# Patient Record
Sex: Female | Born: 1980 | Race: White | Hispanic: No | Marital: Married | Smoking: Former smoker
Health system: Southern US, Community
[De-identification: ages and names within clinical notes are randomized; demographics above are authoritative.]

## PROBLEM LIST (undated history)

## (undated) DIAGNOSIS — F419 Anxiety disorder, unspecified: Secondary | ICD-10-CM

## (undated) DIAGNOSIS — N83209 Unspecified ovarian cyst, unspecified side: Secondary | ICD-10-CM

## (undated) DIAGNOSIS — I10 Essential (primary) hypertension: Secondary | ICD-10-CM

## (undated) HISTORY — PX: WISDOM TOOTH EXTRACTION: SHX21

## (undated) HISTORY — PX: CARPAL TUNNEL RELEASE: SHX101

## (undated) HISTORY — DX: Anxiety disorder, unspecified: F41.9

---

## 2016-11-06 DIAGNOSIS — I10 Essential (primary) hypertension: Secondary | ICD-10-CM | POA: Insufficient documentation

## 2016-11-06 DIAGNOSIS — N329 Bladder disorder, unspecified: Secondary | ICD-10-CM | POA: Insufficient documentation

## 2016-11-06 DIAGNOSIS — K219 Gastro-esophageal reflux disease without esophagitis: Secondary | ICD-10-CM | POA: Insufficient documentation

## 2017-09-29 ENCOUNTER — Encounter (HOSPITAL_BASED_OUTPATIENT_CLINIC_OR_DEPARTMENT_OTHER): Payer: Self-pay

## 2017-09-29 ENCOUNTER — Other Ambulatory Visit: Payer: Self-pay

## 2017-09-29 ENCOUNTER — Emergency Department (HOSPITAL_BASED_OUTPATIENT_CLINIC_OR_DEPARTMENT_OTHER)
Admission: EM | Admit: 2017-09-29 | Discharge: 2017-09-29 | Disposition: A | Discharge status: Home / Self Care | Payer: Self-pay | Attending: Emergency Medicine | Admitting: Emergency Medicine

## 2017-09-29 DIAGNOSIS — M25551 Pain in right hip: Secondary | ICD-10-CM | POA: Insufficient documentation

## 2017-09-29 DIAGNOSIS — M545 Low back pain, unspecified: Secondary | ICD-10-CM

## 2017-09-29 DIAGNOSIS — M79604 Pain in right leg: Secondary | ICD-10-CM | POA: Insufficient documentation

## 2017-09-29 DIAGNOSIS — F17210 Nicotine dependence, cigarettes, uncomplicated: Secondary | ICD-10-CM | POA: Insufficient documentation

## 2017-09-29 DIAGNOSIS — I10 Essential (primary) hypertension: Secondary | ICD-10-CM | POA: Insufficient documentation

## 2017-09-29 HISTORY — DX: Essential (primary) hypertension: I10

## 2017-09-29 MED ORDER — CYCLOBENZAPRINE HCL 10 MG PO TABS
10.0000 mg | ORAL_TABLET | Freq: Once | ORAL | Status: AC
  Administered 2017-09-29: 10 mg via ORAL
  Filled 2017-09-29: qty 1

## 2017-09-29 MED ORDER — KETOROLAC TROMETHAMINE 30 MG/ML IJ SOLN
30.0000 mg | Freq: Once | INTRAMUSCULAR | Status: AC
  Administered 2017-09-29: 30 mg via INTRAMUSCULAR
  Filled 2017-09-29: qty 1

## 2017-09-29 MED ORDER — CYCLOBENZAPRINE HCL 10 MG PO TABS: 10.0000 mg | ORAL_TABLET | Freq: Two times a day (BID) | ORAL | 0 refills | Status: DC | PRN

## 2017-09-29 NOTE — ED Notes (Addendum)
Reports right hip and leg pain.  States the pain radiates down her leg.  Reports recent extended periods of travel in car due to moving from [place] to [place].  Denies chest pain, shortness of breath.  Reports also lifting heavy boxes.

## 2017-09-29 NOTE — Discharge Instructions (Addendum)
The pain you feel coming down your leg is starting in your low back. I also felt a muscle spasm in your back and gluteal region. I have prescribed you a muscle relaxer that will help with the spasms.  You may use Tylenol and/or Ibuprofen for pain relief. You may also find that a warm compress or heating pad in your low back may give additional relief.   I have placed information on an orthopedic provider that you can follow-up with if you continue to have problems after 4-6 weeks.

## 2017-09-29 NOTE — ED Provider Notes (Signed)
[facility] EMERGENCY DEPARTMENT Provider Note  CSN: [id number] Arrival date & time: 09/29/17  1100   History   Chief Complaint Chief Complaint  Patient presents with  . Leg Pain    HPI Jennifer Salazar is a 37 y.o. female with no significant medical history who presented to the ED for right hip and leg pain x2 days. She describes sharp, shooting and burning pain that begins in her right hip and goes down her leg. She also describes an achy feeling in her hip when touched as well. Denies fever, leg swelling, palpitations, cough and SOB. Denies recent falls, traumas or injuries. Denies extremity paresthesias, weakness, foot drop or bowel/bladder dysfunction. 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   Past Medical History:  Diagnosis Date  . Hypertension     There are no active problems to display for this patient.   History reviewed. No pertinent surgical history.   OB History   None      Home Medications    Prior to Admission medications   Medication Sig Start Date End Date Taking? Authorizing Provider  cyclobenzaprine (FLEXERIL) 10 MG tablet Take 1 tablet (10 mg total) by mouth 2 (two) times daily as needed for muscle spasms. 09/29/17   Mortis, Sharyon MedicusGabrielle I, PA-C    Family History No family history on file.  Social History Social History   Tobacco Use  . Smoking status: Current Every Day Smoker  . Smokeless tobacco: Never Used  Substance Use Topics  . Alcohol use: Never    Frequency: Never  . Drug use: Never     Allergies   Reglan [metoclopramide]   Review of Systems Review of Systems  Constitutional: Negative.   Respiratory: Negative for cough and shortness of breath.   Cardiovascular: Negative for chest pain, palpitations and leg swelling.  Genitourinary: Negative.   Musculoskeletal: Positive for back pain. Negative for arthralgias, gait problem, myalgias and neck pain.  Skin: Negative.   Neurological: Negative for weakness and numbness.         Burning pain in right lower extremity   Physical Exam Updated Vital Signs BP (!) 143/94 (BP Location: Left Arm)   Pulse 94   Temp 98.2 F (36.8 C) (Oral)   Resp 18   Ht 5\' 2"  (1.575 m)   Wt 80.9 kg   SpO2 99%   BMI 32.62 kg/m   Physical Exam  Constitutional: She appears well-developed and well-nourished. No distress.  Cardiovascular: Intact distal pulses and normal pulses.  Musculoskeletal:       Lumbar back: She exhibits decreased range of motion, tenderness and spasm. She exhibits no bony tenderness.  Full active and passive ROM of upper and lower extremities bilaterally with 5/5 strength. Limited movement in lumbar spine due to pain. No bony tenderness of spinous processes. Muscle spasm palpated in right lumbar region. Negative straight leg raise. No bony or muscular tenderness of the right lower extremity with movement or palpation.  Neurological: She has normal strength. She displays no atrophy. No sensory deficit. She exhibits normal muscle tone. Gait normal.  Reflex Scores:      Tricep reflexes are 2+ on the right side and 2+ on the left side.      Bicep reflexes are 2+ on the right side and 2+ on the left side.      Brachioradialis reflexes are 2+ on the right side and 2+ on the left side.      Patellar reflexes are 2+ on the right side and  2+ on the left side.      Achilles reflexes are 2+ on the right side and 2+ on the left side. Skin: Skin is warm and intact. Capillary refill takes less than 2 seconds. No bruising and no ecchymosis noted.  Nursing note and vitals reviewed.  ED Treatments / Results  Labs (all labs ordered are listed, but only abnormal results are displayed) Labs Reviewed - No data to display  EKG None  Radiology No results found.  Procedures Procedures (including critical care time)  Medications Ordered in ED Medications  cyclobenzaprine (FLEXERIL) tablet 10 mg (10 mg Oral Given 09/29/17 1322)  ketorolac (TORADOL) 30 MG/ML injection 30  mg (30 mg Intramuscular Given 09/29/17 1321)   Initial Impression / Assessment and Plan / ED Course  Triage vital signs and the nursing notes have been reviewed.  Pertinent labs & imaging results that were available during care of the patient were reviewed and considered in medical decision making (see chart for details).   Patient is in no distress and well appearing. Patient complains of right hip pain, but on further evaluation pain is originating from the right lumbar region where patient has the most tenderness on palpation. Fortunately, there are no abnormal neuro findings that raise concern for acute spinal cord pathology. PERC score of 0.  No bony tenderness or deformities of joints that warrant imaging. Neurovascular function is intact. There are no other physical exam findings or s/s that suggest an underlying infectious or rheumatologic process that warrant further evaluation or intervention today.  Final Clinical Impressions(s) / ED Diagnoses  1. Low Back Pain. Education provided on OTC and supportive treatment for pain relief and inflammation. Flexeril prescribed for muscle spasms.  Dispo: Home. After thorough clinical evaluation, this patient is determined to be medically stable and can be safely discharged with the previously mentioned treatment and/or outpatient follow-up/referral(s). At this time, there are no other apparent medical conditions that require further screening, evaluation or treatment.  Final diagnoses:  Acute right-sided low back pain without sciatica    ED Discharge Orders         Ordered    cyclobenzaprine (FLEXERIL) 10 MG tablet  2 times daily PRN     09/29/17 1347            Dagoberto LigasMortis, Gabrielle I, PA-C 09/29/17 1427    Sabas SousBero, Michael M, MD 09/29/17 1451

## 2017-09-29 NOTE — ED Triage Notes (Signed)
C/o right LE pain x 2 weeks-denies injury-NAD-steady gait

## 2017-09-30 ENCOUNTER — Ambulatory Visit: Payer: Self-pay | Admitting: Family Medicine

## 2017-10-23 ENCOUNTER — Other Ambulatory Visit: Payer: Self-pay

## 2017-10-23 ENCOUNTER — Emergency Department (HOSPITAL_COMMUNITY)
Admission: EM | Admit: 2017-10-23 | Discharge: 2017-10-24 | Disposition: A | Discharge status: Home / Self Care | Payer: No Typology Code available for payment source | Attending: Emergency Medicine | Admitting: Emergency Medicine

## 2017-10-23 ENCOUNTER — Encounter (HOSPITAL_COMMUNITY): Payer: Self-pay

## 2017-10-23 DIAGNOSIS — G43B0 Ophthalmoplegic migraine, not intractable: Secondary | ICD-10-CM | POA: Diagnosis not present

## 2017-10-23 DIAGNOSIS — I10 Essential (primary) hypertension: Secondary | ICD-10-CM | POA: Insufficient documentation

## 2017-10-23 DIAGNOSIS — Z79899 Other long term (current) drug therapy: Secondary | ICD-10-CM | POA: Insufficient documentation

## 2017-10-23 DIAGNOSIS — R451 Restlessness and agitation: Secondary | ICD-10-CM | POA: Insufficient documentation

## 2017-10-23 DIAGNOSIS — R51 Headache: Secondary | ICD-10-CM | POA: Diagnosis present

## 2017-10-23 MED ORDER — SODIUM CHLORIDE 0.9 % IV BOLUS
1000.0000 mL | Freq: Once | INTRAVENOUS | Status: AC
  Administered 2017-10-23: 1000 mL via INTRAVENOUS

## 2017-10-23 MED ORDER — INDOMETHACIN 25 MG PO CAPS: 25.0000 mg | ORAL_CAPSULE | Freq: Three times a day (TID) | ORAL | 0 refills | Status: DC | PRN

## 2017-10-23 MED ORDER — DEXAMETHASONE SODIUM PHOSPHATE 10 MG/ML IJ SOLN
10.0000 mg | Freq: Once | INTRAMUSCULAR | Status: AC
  Administered 2017-10-23: 10 mg via INTRAVENOUS
  Filled 2017-10-23: qty 1

## 2017-10-23 MED ORDER — PROMETHAZINE HCL 25 MG/ML IJ SOLN
12.5000 mg | Freq: Once | INTRAMUSCULAR | Status: AC
  Administered 2017-10-23: 12.5 mg via INTRAVENOUS
  Filled 2017-10-23: qty 1

## 2017-10-23 MED ORDER — DIPHENHYDRAMINE HCL 50 MG/ML IJ SOLN
25.0000 mg | Freq: Once | INTRAMUSCULAR | Status: AC
  Administered 2017-10-23: 25 mg via INTRAVENOUS
  Filled 2017-10-23: qty 1

## 2017-10-23 MED ORDER — LORAZEPAM 2 MG/ML IJ SOLN
1.0000 mg | Freq: Once | INTRAMUSCULAR | Status: AC
  Administered 2017-10-23: 1 mg via INTRAVENOUS
  Filled 2017-10-23: qty 1

## 2017-10-23 MED ORDER — KETOROLAC TROMETHAMINE 15 MG/ML IJ SOLN
15.0000 mg | Freq: Once | INTRAMUSCULAR | Status: AC
  Administered 2017-10-23: 15 mg via INTRAVENOUS
  Filled 2017-10-23: qty 1

## 2017-10-23 NOTE — Discharge Instructions (Addendum)
Take indomethacin with melatonin if symptoms continue. Return to the emergency room for any worsening or concerning symptoms. Follow-up with primary care, referrals given for local primary care providers.  Today in the emergency room you were given Decadron, Phenergan, Benadryl and Toradol.  Been given Ativan for restless legs.

## 2017-10-23 NOTE — ED Provider Notes (Addendum)
[facility]-EMERGENCY DEPT Provider Note   CSN: [id number] Arrival date & time: 10/23/17  1332     History   Chief Complaint Chief Complaint  Patient presents with  . Migraine  . Anxiety    HPI Jennifer Salazar is a 37 y.o. female.  37 year old female presents with complaint of headache.  Patient states that she has a history of migraines since she was a child.  Patient states that she started getting a migraine a few days ago, last night noticed that she was developing a sudden sharp stabbing pain in her left eye.  Patient states that she had a similar migraine with the ice pick pain in the left eye about 2 years ago while living in another state.  Patient states that she had had that migraine for about a month, went to the ER after 3 weeks of pain and was given medication which made her sleepy but did not help with her headache, states that she woke up with 10 fold increase in her pain.  Patient was seen by her gynecologist with plan to remove her IUD as she thought this may be causing her headache and the gynecologist started her on a birth control pack with the plan of the gradually decreasing dose of hormone would better control her headache (prior to the IUD was on Depo, the GYN thought the high dose of the depo vs the low hormone level of the IUD had caused the headache).  Patient states that she had not had another headache this severe until now.  Reports nausea and vomiting with light sensitivity. Patient also reports starry vision aura prior to onset of her migraine which is typical for her migraines. Patient began to feel very anxious when she developed the pain in her eye with fear the headache would last a month again like the last episode. Denies changes in speech/gait, weakness, denies sudden severe onset of her pain. No relief with her Excedrin and Fioricet at home, reports some relief with application of a heating pad to her forehead. No other complaints or  concerns. Last CT head was 2 years ago when in the ER for similar headache, normal, has never been seen by neurology.      Past Medical History:  Diagnosis Date  . Hypertension     There are no active problems to display for this patient.   Past Surgical History:  Procedure Laterality Date  . WISDOM TOOTH EXTRACTION       OB History   None      Home Medications    Prior to Admission medications   Medication Sig Start Date End Date Taking? Authorizing Provider  butalbital-acetaminophen-caffeine (FIORICET, ESGIC) 50-325-40 MG tablet Take 1 tablet by mouth as needed for migraine. 08/24/17  Yes [provider]  calcium carbonate (TUMS - DOSED IN MG ELEMENTAL CALCIUM) 500 MG chewable tablet Chew 1 tablet by mouth as needed for indigestion or heartburn.   Yes [provider]  cyclobenzaprine (FLEXERIL) 10 MG tablet Take 1 tablet (10 mg total) by mouth 2 (two) times daily as needed for muscle spasms. 09/29/17  Yes Mortis, Sharyon Medicus, PA-C  ibuprofen (ADVIL,MOTRIN) 200 MG tablet Take 400 mg by mouth every 6 (six) hours as needed for moderate pain.   Yes [provider]  lisinopril (PRINIVIL,ZESTRIL) 20 MG tablet Take 20 mg by mouth 2 (two) times daily.   Yes [provider]  omeprazole (PRILOSEC) 40 MG capsule Take 40 mg by mouth as  needed for heartburn. 10/12/17  Yes [provider]  promethazine (PHENERGAN) 25 MG tablet Take 25 mg by mouth as needed for nausea.  07/30/17  Yes [provider]  traMADol (ULTRAM) 50 MG tablet Take 50 mg by mouth as needed for pain. 10/13/17  Yes [provider]  indomethacin (INDOCIN) 25 MG capsule Take 1 capsule (25 mg total) by mouth 3 (three) times daily as needed. 10/23/17   Jeannie Fend, PA-C    Family History Family History  Problem Relation Age of Onset  . Hypertension Mother   . COPD Father   . Crohn's disease Father     Social History Social History   Tobacco Use  . Smoking  status: Current Every Day Smoker    Packs/day: 0.50    Types: Cigarettes  . Smokeless tobacco: Never Used  Substance Use Topics  . Alcohol use: Never    Frequency: Never  . Drug use: Never     Allergies   Reglan [metoclopramide]   Review of Systems Review of Systems  Constitutional: Negative for chills and fever.  HENT: Negative for congestion and dental problem.   Eyes: Positive for pain. Negative for redness and visual disturbance.  Gastrointestinal: Positive for nausea and vomiting. Negative for abdominal pain, constipation and diarrhea.  Genitourinary: Negative for dysuria, frequency and urgency.  Musculoskeletal: Negative for neck pain and neck stiffness.  Skin: Negative for rash and wound.  Allergic/Immunologic: Negative for immunocompromised state.  Neurological: Positive for headaches. Negative for dizziness, facial asymmetry, speech difficulty, weakness and numbness.  Hematological: Does not bruise/bleed easily.  Psychiatric/Behavioral: Negative for confusion.  All other systems reviewed and are negative.    Physical Exam Updated Vital Signs BP (!) 159/95 (BP Location: Left Arm)   Pulse 95   Temp 98.7 F (37.1 C) (Oral)   Resp 14   Ht 5\' 2"  (1.575 m)   Wt 81.6 kg   SpO2 95%   BMI 32.92 kg/m   Physical Exam  Constitutional: She is oriented to person, place, and time. She appears well-developed and well-nourished. No distress.  Appears uncomfortable, lights dimmed for comfort  HENT:  Head: Normocephalic and atraumatic.  Mouth/Throat: Oropharynx is clear and moist. No oropharyngeal exudate.  Eyes: Pupils are equal, round, and reactive to light. Conjunctivae and EOM are normal.  Neck: Neck supple.  Cardiovascular: Intact distal pulses.  Pulmonary/Chest: Effort normal.  Musculoskeletal: She exhibits no tenderness.  Neurological: She is alert and oriented to person, place, and time. She has normal strength. She is not disoriented. She displays normal  reflexes. No cranial nerve deficit or sensory deficit. She exhibits normal muscle tone. GCS eye subscore is 4. GCS verbal subscore is 5. GCS motor subscore is 6. She displays no Babinski's sign on the right side. She displays no Babinski's sign on the left side.  Skin: Skin is warm and dry. No rash noted. She is not diaphoretic.  Psychiatric: She has a normal mood and affect. Her behavior is normal.  Nursing note and vitals reviewed.    ED Treatments / Results  Labs (all labs ordered are listed, but only abnormal results are displayed) Labs Reviewed - No data to display  EKG None  Radiology No results found.  Procedures Procedures (including critical care time)  Medications Ordered in ED Medications  diphenhydrAMINE (BENADRYL) capsule 25 mg (25 mg Oral Refused 10/24/17 0024)  magnesium sulfate IVPB 2 g 50 mL (has no administration in time range)  dexamethasone (DECADRON) injection 10 mg (10  mg Intravenous Given 10/23/17 2227)  ketorolac (TORADOL) 15 MG/ML injection 15 mg (15 mg Intravenous Given 10/23/17 2224)  diphenhydrAMINE (BENADRYL) injection 25 mg (25 mg Intravenous Given 10/23/17 2225)  promethazine (PHENERGAN) injection 12.5 mg (12.5 mg Intravenous Given 10/23/17 2222)  sodium chloride 0.9 % bolus 1,000 mL (1,000 mLs Intravenous New Bag/Given 10/23/17 2229)  LORazepam (ATIVAN) injection 1 mg (1 mg Intravenous Given 10/23/17 2315)  acetaminophen (TYLENOL) tablet 650 mg (650 mg Oral Given 10/24/17 0013)     Initial Impression / Assessment and Plan / ED Course  I have reviewed the triage vital signs and the nursing notes.  Pertinent labs & imaging results that were available during my care of the patient were reviewed by me and considered in my medical decision making (see chart for details).  Clinical Course as of Oct 24 29  Sat Oct 23, 2017  [id number] 37 year old female presents with complaint of migraine headache now with ice pick feeling in her left eye.  Patient states she has a  history of migraines since she was a teenager, had a similar ice pick headache 2 [place] ago [place] out of state.  Patient was seen in the emergency room 2 years ago with this headache, CT of the head was normal, states she had intraocular pressure checked and this was normal as well.  Headache was finally relieved when she saw her gynecologist to put her on a birth control pack.  Exam is unremarkable, patient sensitive to light, visibly uncomfortable in the room.  Patient was given IV fluids with Phenergan, Toradol, Decadron, Benadryl.  Patient states she has had some relief of her headache pain however 1 of the medications made her legs feel restless.  Patient was given Ativan for this with little relief.  Referral given for area PCP for follow-up, return to ER for any worsening or concerning symptoms.  Up-to-date recommendation for ice pick headache includes indomethacin with melatonin, discussed this with patient, prescription given for indomethacin.   [LM]  Sun Oct 24, 2017  0029 Patient reports ongoing restless legs, requests something to help with this, no relief with Ativan.  She states that she forgot she has had this reaction to Benadryl in the past.  Patient will be given magnesium for her headache while she finishes her IV fluids with plan to discharge home afterwards.   [LM]    Clinical Course User Index [LM] Jeannie Fend, PA-C    Final Clinical Impressions(s) / ED Diagnoses   Final diagnoses:  Ophthalmoplegic migraine, not intractable    ED Discharge Orders         Ordered    indomethacin (INDOCIN) 25 MG capsule  3 times daily PRN     10/23/17 2341           Jeannie Fend, PA-C 10/23/17 2345    Little, Ambrose Finland, MD 10/23/17 2359    Jeannie Fend, PA-C 10/24/17 0031    Little, Ambrose Finland, MD 10/24/17 1458

## 2017-10-23 NOTE — ED Triage Notes (Addendum)
Patient c/o migraine, nausea x 3 days and patient states that she has been able to take Fiorcet and Excedrin to manage the pain until today. Patient states it feels like a "hot stick" behind her eyes. Patient c/o sound, movement, and smells make her headache worse. Patient also c/o feeling very anxious today.

## 2017-10-24 MED ORDER — ACETAMINOPHEN 325 MG PO TABS
650.0000 mg | ORAL_TABLET | Freq: Once | ORAL | Status: AC
  Administered 2017-10-24: 650 mg via ORAL
  Filled 2017-10-24: qty 2

## 2017-10-24 MED ORDER — DIPHENHYDRAMINE HCL 25 MG PO CAPS
25.0000 mg | ORAL_CAPSULE | Freq: Once | ORAL | Status: DC
  Filled 2017-10-24: qty 1

## 2017-10-24 MED ORDER — MAGNESIUM SULFATE 2 GM/50ML IV SOLN
2.0000 g | Freq: Once | INTRAVENOUS | Status: AC
  Administered 2017-10-24: 2 g via INTRAVENOUS
  Filled 2017-10-24: qty 50

## 2017-10-24 NOTE — ED Notes (Addendum)
While giving medications for pain, pt stated that she had a previous reaction to Benadryl similar to her current restless leg problem. Pt refused to the Benadryl and provider informed.

## 2017-10-24 NOTE — ED Notes (Addendum)
Pt reported legs had become restless and could not stop moving them after the giving medications. Patient was A/O 4x, stable vital signs, no itching or signs of allergic reaction. Provider was informed.

## 2017-10-24 NOTE — ED Notes (Signed)
Pt stated that her headache had not improved after medications, and was still in pain. Provider informed.

## 2017-10-24 NOTE — ED Notes (Signed)
While being discharged a wheel chair was offered to patient. Pt refused stating "the walking will help with my legs."

## 2017-12-08 ENCOUNTER — Ambulatory Visit (INDEPENDENT_AMBULATORY_CARE_PROVIDER_SITE_OTHER): Payer: No Typology Code available for payment source | Admitting: Family Medicine

## 2017-12-08 ENCOUNTER — Encounter: Payer: Self-pay | Admitting: Family Medicine

## 2017-12-08 VITALS — BP 148/118 | HR 110 | Temp 98.4°F | Resp 17 | Ht 63.0 in | Wt 177.0 lb

## 2017-12-08 DIAGNOSIS — R51 Headache: Secondary | ICD-10-CM

## 2017-12-08 DIAGNOSIS — G8929 Other chronic pain: Secondary | ICD-10-CM

## 2017-12-08 DIAGNOSIS — J0100 Acute maxillary sinusitis, unspecified: Secondary | ICD-10-CM | POA: Diagnosis not present

## 2017-12-08 DIAGNOSIS — I10 Essential (primary) hypertension: Secondary | ICD-10-CM

## 2017-12-08 MED ORDER — HYDROXYZINE HCL 25 MG PO TABS: 25.0000 mg | ORAL_TABLET | Freq: Three times a day (TID) | ORAL | 0 refills | Status: DC | PRN

## 2017-12-08 MED ORDER — FLUCONAZOLE 150 MG PO TABS: 150.0000 mg | ORAL_TABLET | Freq: Once | ORAL | 0 refills | Status: AC

## 2017-12-08 MED ORDER — AMOXICILLIN-POT CLAVULANATE 875-125 MG PO TABS: 1.0000 | ORAL_TABLET | Freq: Two times a day (BID) | ORAL | 0 refills | Status: DC

## 2017-12-08 MED ORDER — CHLORTHALIDONE 25 MG PO TABS: 25.0000 mg | ORAL_TABLET | Freq: Every day | ORAL | 0 refills | Status: DC

## 2017-12-08 NOTE — Progress Notes (Signed)
10/23/20192:26 PM  Jennifer Salazar November 18, 1980, 37 y.o. female [id number]  Chief Complaint  Patient presents with  . Arm Pain  . Headache    HPI:   Patient is a 37 y.o. female with past medical history significant for HTN, migraines, GERD who presents today for headaches and arm pain  Recently moved to [place] for her husband's job Under significant recent stress Moved from [place]  Several years ago had about a month of severe headaches Finally resolved with low dose estrogen ocp  She described like an ice-pick behind her left eye fioricet used to help Recently seen in ER, either IV benadryl or phenegran, made her feel very creepy crawly  Current headaches, daily, left sided, pressure with intermittent throbbing More sensitive to sound and smell No sign photophobia occ flashing lights No focal weakness or dizziness  Having left sided nasal congestion, rhinorrhea, sinus pressure No fever or chills Having cough and PND No SOB  Eyes tear but not sure if related to headaches  Has tried triptans oral and nasal, topamax, fioricet,  Has tried celexa and lexapro Has had brain imagining and it was normal  Got to see neuro recently, thought headaches were caused by anxiety  Currently on lisinopril 20mg  BID Currently has IUD mirena, placed 3 years ago Has had more spotting and bleeding  Has h/o menstrual migraine  PMP reviewed - concerning  Fall Risk  12/08/2017  Falls in the past year? No     Depression screen PHQ 2/9 12/08/2017  Decreased Interest 0  Down, Depressed, Hopeless 0  PHQ - 2 Score 0    Allergies  Allergen Reactions  . Reglan [Metoclopramide]     Prior to Admission medications   Medication Sig Start Date End Date Taking? Authorizing Provider  butalbital-acetaminophen-caffeine (FIORICET, ESGIC) 50-325-40 MG tablet Take 1 tablet by mouth as needed for migraine. 08/24/17  Yes [provider]  calcium carbonate (TUMS - DOSED IN MG ELEMENTAL  CALCIUM) 500 MG chewable tablet Chew 1 tablet by mouth as needed for indigestion or heartburn.   Yes [provider]  cyclobenzaprine (FLEXERIL) 10 MG tablet Take 1 tablet (10 mg total) by mouth 2 (two) times daily as needed for muscle spasms. 09/29/17  Yes Mortis, Sharyon Medicus, PA-C  ibuprofen (ADVIL,MOTRIN) 200 MG tablet Take 400 mg by mouth every 6 (six) hours as needed for moderate pain.   Yes [provider]  lisinopril (PRINIVIL,ZESTRIL) 20 MG tablet Take 20 mg by mouth 2 (two) times daily.   Yes [provider]  omeprazole (PRILOSEC) 40 MG capsule Take 40 mg by mouth as needed for heartburn. 10/12/17  Yes [provider]  promethazine (PHENERGAN) 25 MG tablet Take 25 mg by mouth as needed for nausea.  07/30/17  Yes [provider]    Past Medical History:  Diagnosis Date  . Hypertension     Past Surgical History:  Procedure Laterality Date  . WISDOM TOOTH EXTRACTION      Social History   Tobacco Use  . Smoking status: Former Smoker    Packs/day: 0.50    Years: 10.00    Pack years: 5.00    Types: Cigarettes    Last attempt to quit: 11/08/2017    Years since quitting: 0.0  . Smokeless tobacco: Never Used  Substance Use Topics  . Alcohol use: Never    Frequency: Never    Family History  Problem Relation Age of Onset  . Hypertension Mother   . COPD Father   .  Crohn's disease Father     Review of Systems  Respiratory: Negative for shortness of breath.   Cardiovascular: Negative for chest pain.   Per hpi  OBJECTIVE:  Blood pressure (!) 148/118, pulse (!) 110, temperature 98.4 F (36.9 C), temperature source Oral, resp. rate 17, height 5\' 3"  (1.6 m), weight 177 lb (80.3 kg), SpO2 98 %. Body mass index is 31.35 kg/m.   BP Readings from Last 3 Encounters:  12/08/17 (!) 148/118  10/24/17 (!) 152/105  09/29/17 (!) 143/94    Physical Exam  Constitutional: She is oriented to person, place, and time. She appears  well-developed and well-nourished.  HENT:  Head: Normocephalic and atraumatic.  Right Ear: Hearing, tympanic membrane, external ear and ear canal normal.  Left Ear: Hearing, tympanic membrane, external ear and ear canal normal.  Nose: Mucosal edema and rhinorrhea present. Left sinus exhibits maxillary sinus tenderness.  Mouth/Throat: Oropharynx is clear and moist.  Eyes: Pupils are equal, round, and reactive to light. Conjunctivae and EOM are normal.  Neck: Neck supple.  Cardiovascular: Normal rate, regular rhythm and normal heart sounds. Exam reveals no gallop and no friction rub.  No murmur heard. Pulmonary/Chest: Effort normal and breath sounds normal. She has no wheezes. She has no rales.  Musculoskeletal: She exhibits no edema.  Lymphadenopathy:    She has no cervical adenopathy.  Neurological: She is alert and oriented to person, place, and time.  Skin: Skin is warm and dry.  Psychiatric: She has a normal mood and affect.  Nursing note and vitals reviewed.   ASSESSMENT and PLAN  1. Essential hypertension, benign Uncontrolled. Adding chlorthalidone. New med r/se/b reviewed - Comprehensive metabolic panel - TSH - CBC  2. Chronic left-sided headaches Unclear etiology, thought maybe cluster given "icepick" and left sided URI sx, did trial of oxygen, did not respond. Discussed multifactorial causes. Will see how she responds with treatment of BP and sinus infection Concerning pmp At end of visit patient requested something either for pain or anxiety due to unrelenting headaches, vistaril given  3. Acute non-recurrent maxillary sinusitis Treating with Augmentin. Diflucan given in case yeast infection.   Other orders - chlorthalidone (HYGROTON) 25 MG tablet; Take 1 tablet (25 mg total) by mouth daily. - amoxicillin-clavulanate (AUGMENTIN) 875-125 MG tablet; Take 1 tablet by mouth 2 (two) times daily. - fluconazole (DIFLUCAN) 150 MG tablet; Take 1 tablet (150 mg total) by mouth  once for 1 dose. Repeat if needed - hydrOXYzine (ATARAX/VISTARIL) 25 MG tablet; Take 1 tablet (25 mg total) by mouth 3 (three) times daily as needed.  Return in about 1 week (around 12/15/2017).    Myles Lipps, MD [facility] at [facility] [address] [place], [place] [address] Ph.  [phone] Fax [phone]

## 2017-12-08 NOTE — Patient Instructions (Signed)
° ° ° °  If you have lab work done today you will be contacted with your lab results within the next 2 weeks.  If you have not heard from us then please contact us. The fastest way to get your results is to register for My Chart. ° ° °IF you received an x-ray today, you will receive an invoice from [facility]. Please contact [facility] at [phone] with questions or concerns regarding your invoice.  ° °IF you received labwork today, you will receive an invoice from [facility]. Please contact [facility] at [phone] with questions or concerns regarding your invoice.  ° °Our billing staff will not be able to assist you with questions regarding bills from these companies. ° °You will be contacted with the lab results as soon as they are available. The fastest way to get your results is to activate your My Chart account. Instructions are located on the last page of this paperwork. If you have not heard from us regarding the results in 2 weeks, please contact this office. °  ° ° ° °

## 2017-12-09 LAB — COMPREHENSIVE METABOLIC PANEL
ALT: 9 IU/L (ref 0–32)
AST: 13 IU/L (ref 0–40)
Albumin/Globulin Ratio: 1.7 (ref 1.2–2.2)
Albumin: 4.5 g/dL (ref 3.5–5.5)
Alkaline Phosphatase: 78 IU/L (ref 39–117)
BUN/Creatinine Ratio: 7 — ABNORMAL LOW (ref 9–23)
BUN: 6 mg/dL (ref 6–20)
Bilirubin Total: 0.5 mg/dL (ref 0.0–1.2)
CO2: 23 mmol/L (ref 20–29)
Calcium: 9.3 mg/dL (ref 8.7–10.2)
Chloride: 105 mmol/L (ref 96–106)
Creatinine, Ser: 0.84 mg/dL (ref 0.57–1.00)
GFR calc Af Amer: 103 mL/min/{1.73_m2} (ref >59)
GFR calc non Af Amer: 89 mL/min/{1.73_m2} (ref >59)
Globulin, Total: 2.6 g/dL (ref 1.5–4.5)
Glucose: 99 mg/dL (ref 65–99)
Potassium: 4.1 mmol/L (ref 3.5–5.2)
Sodium: 142 mmol/L (ref 134–144)
Total Protein: 7.1 g/dL (ref 6.0–8.5)

## 2017-12-09 LAB — CBC
Hematocrit: 44.9 % (ref 34.0–46.6)
Hemoglobin: 15.1 g/dL (ref 11.1–15.9)
MCH: 32.2 pg (ref 26.6–33.0)
MCHC: 33.6 g/dL (ref 31.5–35.7)
MCV: 96 fL (ref 79–97)
Platelets: 330 10*3/uL (ref 150–450)
RBC: 4.69 x10E6/uL (ref 3.77–5.28)
RDW: 12.1 % — ABNORMAL LOW (ref 12.3–15.4)
WBC: 9 10*3/uL (ref 3.4–10.8)

## 2017-12-09 LAB — TSH: TSH: 0.998 u[IU]/mL (ref 0.450–4.500)

## 2017-12-13 ENCOUNTER — Ambulatory Visit: Payer: 59 | Admitting: Family Medicine

## 2018-03-18 ENCOUNTER — Encounter (HOSPITAL_BASED_OUTPATIENT_CLINIC_OR_DEPARTMENT_OTHER): Payer: Self-pay

## 2018-03-18 ENCOUNTER — Emergency Department (HOSPITAL_BASED_OUTPATIENT_CLINIC_OR_DEPARTMENT_OTHER)
Admission: EM | Admit: 2018-03-18 | Discharge: 2018-03-18 | Disposition: A | Discharge status: Home / Self Care | Payer: No Typology Code available for payment source | Attending: Emergency Medicine | Admitting: Emergency Medicine

## 2018-03-18 ENCOUNTER — Other Ambulatory Visit: Payer: Self-pay

## 2018-03-18 DIAGNOSIS — Z87891 Personal history of nicotine dependence: Secondary | ICD-10-CM | POA: Diagnosis not present

## 2018-03-18 DIAGNOSIS — F419 Anxiety disorder, unspecified: Secondary | ICD-10-CM | POA: Insufficient documentation

## 2018-03-18 DIAGNOSIS — I10 Essential (primary) hypertension: Secondary | ICD-10-CM | POA: Diagnosis not present

## 2018-03-18 MED ORDER — LORAZEPAM 1 MG PO TABS
1.0000 mg | ORAL_TABLET | Freq: Once | ORAL | Status: AC
  Administered 2018-03-18: 1 mg via ORAL
  Filled 2018-03-18: qty 1

## 2018-03-18 NOTE — ED Notes (Signed)
ED Provider at bedside. 

## 2018-03-18 NOTE — Discharge Instructions (Signed)
Outpatient Psychiatry and Counseling  [facility]: Mobile Crisis Management 24 hours:  [phone]  [facility] sliding scale fee and walk in schedule: M-F 8am-12pm/1pm-3pm [address]  [place], [place] [address] [phone]  [facility] [address] [place], [place] [address] [phone]  [facility] ([facility])- new patient walk-in appointments available Monday - Friday 8am -3pm.          [address] [place], [place] [address] [phone] or crisis line- [phone]  [facility] Outpatient Services/ Intensive Outpatient Therapy Program [address] [place], [place] [address] [phone]  [facility]                  Crisis Services      [phone]      [address]     [place], [place] [address]                 [facility]   [facility] [phone] [address] [place], [place] [address]   [facility]          [address],  [place], [place] [address]       [phone]  [facility] [address] [place], [place] [address] [phone]  [facility]    [address]    [place], [place] [address]     [phone]       Parish McKinney, MD     [address]     [place] [address]     [phone]       [facility] [address] [place] [address]  [facility]     [address]     [place], [place]      [phone]       [facility] Shamsher Ahluwalia, MD [address] [place], [place] [address] [phone]  [facility]     [address]     [place], [place] [address]     [phone]       [facility] [address] [place], [place] [address] [phone] Resources for Temporary  Residential Assistance/Crisis Centers  [facility] M-F 8am-3pm   [address] [place], [place] [address]   [phone] Services include: laundry, barbering, support groups, case management, phone  & computer access, showers, AA/NA mtgs, mental health/substance abuse nurse, job skills class, disability information, VA assistance, spiritual classes, etc.   HOMELESS SHELTERS  [facility]     [facility]   [address], [place]     [phone]              [facility] (women and children)       [address] [place], [place] [address] [phone] [email or web address] for application and process Application Required  [facility]   [address]    [place] [address]     [phone]                    [facility] [address] , [place] [address] [phone](schedule application appt.) Application Required  [facility] (women only)    [address]     [place], [place] [address]       [phone]      Intake starts 6pm daily Need valid ID, SSC, & Police report [facility] [place] [address] [place], [place] [phone] Application Required  [facility] (men only)     [address]      [place], [place]     [phone]       [facility] (Pregnant women only) [address] [place], [place] [phone]  [facility]      [address]      [place], [place] [address]     [phone]             [facility] [facility] [address] [place], [place] [phone] 90 day commitment/SA/Application process  [facility](men only)     [address]     [place], [place]     [phone]       Check-in at 7pm            [facility] [address] [place], [place] [address] [phone] Men/Women/Women and Children  must be there by 7 pm  [facility] [place], [place] [phone]                 

## 2018-03-18 NOTE — ED Provider Notes (Signed)
Emergency Department Provider Note   I have reviewed the triage vital signs and the nursing notes.   HISTORY  Chief Complaint No chief complaint on file.   HPI Loreane Teats is a 38 y.o. female with PMH of HTN presents to the emergency department with worsening anxiety symptoms.  The patient states that she moved here recently with her husband and after getting a new job.  She states that she does not have any family support and is feeling increasingly stressed.  She has had worsening lower back pain for which she was seen in the emergency department yesterday.  She has been taking her Flexeril which has improved her symptoms.  No worsening back pain or fever.  The patient adamantly denies any suicidal or homicidal ideation.  She states in the distant past she had to take Xanax and an anti-depressant but did not continue them Verneal Wiers-term.  She states that she had refilled a Xanax prescription from back home in [place] but has since run out of those tablets. She does not have a local primary care physician.  Past Medical History:  Diagnosis Date  . Hypertension     There are no active problems to display for this patient.   Past Surgical History:  Procedure Laterality Date  . WISDOM TOOTH EXTRACTION     Allergies Reglan [metoclopramide]  Family History  Problem Relation Age of Onset  . Hypertension Mother   . COPD Father   . Crohn's disease Father     Social History Social History   Tobacco Use  . Smoking status: Former Smoker    Packs/day: 0.50    Years: 10.00    Pack years: 5.00    Types: Cigarettes    Last attempt to quit: 11/08/2017    Years since quitting: 0.3  . Smokeless tobacco: Never Used  Substance Use Topics  . Alcohol use: Never    Frequency: Never  . Drug use: Never    Review of Systems  Constitutional: No fever/chills Eyes: No visual changes. ENT: No sore throat. Cardiovascular: Denies chest pain. Respiratory: Denies shortness of  breath. Gastrointestinal: No abdominal pain.  No nausea, no vomiting.  No diarrhea.  No constipation. Genitourinary: Negative for dysuria. Musculoskeletal: Negative for back pain. Skin: Negative for rash. Neurological: Negative for headaches, focal weakness or numbness. Psychiatric:Increased anxiety. Denies SI/HI.   10-point ROS otherwise negative.  ____________________________________________   PHYSICAL EXAM:  VITAL SIGNS: ED Triage Vitals [03/18/18 2012]  Enc Vitals Group     BP (!) 204/117     Pulse Rate (!) 126     Resp 20     Temp 98.5 F (36.9 C)     Temp Source Oral     SpO2 100 %     Weight 178 lb (80.7 kg)     Height 5\' 3"  (1.6 m)     Pain Score 3   Constitutional: Alert and oriented. Well appearing and in no acute distress. Tearful at times.  Eyes: Conjunctivae are normal.  Head: Atraumatic. Nose: No congestion/rhinnorhea. Mouth/Throat: Mucous membranes are moist.  Neck: No stridor.   Cardiovascular: Tachycardia. Good peripheral circulation. Grossly normal heart sounds.   Respiratory: Normal respiratory effort.  No retractions. Lungs CTAB. Gastrointestinal: No distention.  Musculoskeletal: No gross deformities of extremities. Neurologic:  Normal speech and language.  Skin:  Skin is warm, dry and intact. No rash noted. Psychiatric: Patient is tearful. Denies SI/HI. Not responding to internal stimulus.   ____________________________________________  RADIOLOGY  None ____________________________________________  PROCEDURES  Procedure(s) performed:   Procedures  None ____________________________________________   INITIAL IMPRESSION / ASSESSMENT AND PLAN / ED COURSE  Pertinent labs & imaging results that were available during my care of the patient were reviewed by me and considered in my medical decision making (see chart for details).  Patient presents to the emergency department with anxiety symptoms.  Patient with multiple stressors at home.   She feels safe.  He denies any suicidal or homicidal ideation.  She reports allergies to Benadryl and Atarax.  She tells me that Xanax has worked well for her in the past.  I discussed that I am hesitant to refill Xanax from the emergency department without a primary care physician.  She was recently started on Flexeril which is also sedating.  In review of the [place] drug database she has had several outpatient prescriptions for pain and muscle relaxer type medications which could be dangerous in combination. I discussed that I would like to treat her symptoms but given her allergies I cannot provide any Rx for Benzodiazepines. Provided a list of community resources for outpatient psychiatry help and also provided contact information for her primary care physician. Discussed ED return precautions.   Patient called Husband for a ride home. She was given Ativan in the ED and encouraged to f/u with PCP and Psychiatry resources listed as needed.  ____________________________________________  FINAL CLINICAL IMPRESSION(S) / ED DIAGNOSES  Final diagnoses:  Anxiety    Note:  This document was prepared using Dragon voice recognition software and may include unintentional dictation errors.  Alona BeneJoshua Antonetta [place], MD Emergency Medicine    Jonta Gastineau, Arlyss RepressJoshua G, MD 03/18/18 [phone]

## 2018-03-18 NOTE — ED Notes (Signed)
Pt calling husband to see if she can get a ride.

## 2018-03-18 NOTE — ED Notes (Signed)
Upon d/c patient- pt hysterically crying. Discussed therapeutic calming techniques. Pt has support system but husband is currently at work. Pt voices EDP offered med but pt unable to find ride. Atarax listed as pt med but pt voices she does not take that. I informed pt I would give her a minute and reeval before d/c.

## 2018-03-18 NOTE — ED Triage Notes (Addendum)
Pt c/o "unrelenting anxiety" x 2-3 days-pt was seen at [facility] yesterday-states she was seen for back pain that she was missing work and that is making her anxious-states she did not address her anxiety concerns yesterday- "they were busy and I figured I could just handle it on my own"-denies SI/HI-pt NAD-steady gait

## 2018-03-19 ENCOUNTER — Ambulatory Visit (HOSPITAL_COMMUNITY)
Admission: RE | Admit: 2018-03-19 | Discharge: 2018-03-19 | Disposition: A | Discharge status: Home / Self Care | Payer: No Typology Code available for payment source | Attending: Psychiatry | Admitting: Psychiatry

## 2018-03-19 DIAGNOSIS — F410 Panic disorder [episodic paroxysmal anxiety] without agoraphobia: Secondary | ICD-10-CM | POA: Diagnosis not present

## 2018-03-19 DIAGNOSIS — Z888 Allergy status to other drugs, medicaments and biological substances status: Secondary | ICD-10-CM | POA: Diagnosis not present

## 2018-03-19 DIAGNOSIS — Z8249 Family history of ischemic heart disease and other diseases of the circulatory system: Secondary | ICD-10-CM | POA: Insufficient documentation

## 2018-03-19 DIAGNOSIS — F419 Anxiety disorder, unspecified: Secondary | ICD-10-CM | POA: Diagnosis not present

## 2018-03-19 DIAGNOSIS — I10 Essential (primary) hypertension: Secondary | ICD-10-CM | POA: Insufficient documentation

## 2018-03-19 NOTE — BH Assessment (Signed)
Assessment Note  Jennifer Salazar is an 38 y.o. female who presents voluntarily alone reporting primary symptoms of severe anxiety and panic attacks. Pt acknowledges symptoms including crying spells, social withdrawal, loss of interest in usual pleasures, decreased concentration, fatigue, irritability, decreased sleep, decreased appetite.  She states that she and her husband moved here about 8 months ago from [place], and she has few supports here. She works at [employer] and enjoys her job. She states that she doesn't know where the anxiety is coming from, which "makes me worry even more, which ends up in a vicious cycle". She is also worried about what her job will say about what is going on, although they have been supportive and gave her paperwork to fill out for leave to get the help she needs. Pt denies SI, HI, psychosis, SA. PT describes 0 past attempts, history of violence.  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 Pt identifies primary residence as home with husband, pets. Pt identifies primary supports as family. Pt's social history includes no SA history. Pt denies legal involvement. Pt denies abuse history.  Pt identifiesprevious treatment as some OP medication management in the past. Pt reports medication compliant currently Pt describes family MH/SA history--none other than sister who has SA history.  Pt has good insight and judgment. Pt's memory is typical. ? MSE: Pt is casually dressed, alert, oriented x4 with normal speech and normal motor behavior. Eye contact is good. Pt's mood is depressed and affect is depressed and anxious. Affect is congruent with mood. Thought process is coherent and relevant. There is no indication that pt is currently responding to internal stimuli or experiencing delusional thought content. Pt was cooperative throughout assessment.   Jennifer Calkins, NP recommended outpatient psychiatric treatment. Gave [facility]/partial  recommendations in addition to other referral options. Pt agrees to follow up with OP referrals and to call back if any symptoms worsen.      Diagnosis: Anxiety Disorder  Past Medical History:  Past Medical History:  Diagnosis Date  . Hypertension     Past Surgical History:  Procedure Laterality Date  . WISDOM TOOTH EXTRACTION      Family History:  Family History  Problem Relation Age of Onset  . Hypertension Mother   . COPD Father   . Crohn's disease Father     Social History:  reports that she quit smoking about 4 months ago. Her smoking use included cigarettes. She has a 5.00 pack-year smoking history. She has never used smokeless tobacco. She reports that she does not drink alcohol or use drugs.  Additional Social History:  Alcohol / Drug Use Pain Medications: denies Prescriptions: denies Over the Counter: denies History of alcohol / drug use?: No history of alcohol / drug abuse Longest period of sobriety (when/how long): denies  CIWA:   COWS:    Allergies:  Allergies  Allergen Reactions  . Reglan [Metoclopramide]     Home Medications: (Not in a hospital admission)   OB/GYN Status:  No LMP recorded. (Menstrual status: IUD).  General Assessment Data Location of Assessment: [facility] Assessment Services TTS Assessment: In system Is this a Tele or Face-to-Face Assessment?: Face-to-Face Is this an Initial Assessment or a Re-assessment for this encounter?: Initial Assessment Patient Accompanied by:: N/A Language Other than English: No Living Arrangements: (apartment) What gender do you identify as?: Female Marital status: Married Jennifer Salazar name: Jennifer Salazar Pregnancy Status: Unknown Living Arrangements: Spouse/significant other Can pt return to current living arrangement?: Yes Admission Status:  Voluntary Is patient capable of signing voluntary admission?: Yes Referral Source: Self/Family/Friend Insurance type: Designer, industrial/product Exam [facility] Walk-in  ONLY) Medical Exam completed: Yes  Crisis Care Plan Living Arrangements: Spouse/significant other Name of Psychiatrist: (none) Name of Therapist: none  Education Status Is patient currently in school?: No Is the patient employed, unemployed or receiving disability?: Employed  Risk to self with the past 6 months Suicidal Ideation: No Has patient been a risk to self within the past 6 months prior to admission? : No Suicidal Intent: No Has patient had any suicidal intent within the past 6 months prior to admission? : No Is patient at risk for suicide?: No Suicidal Plan?: No Has patient had any suicidal plan within the past 6 months prior to admission? : No Access to Means: No(none known) What has been your use of drugs/alcohol within the last 12 months?: (denies) Previous Attempts/Gestures: No Other Self Harm Risks: (none known) Triggers for Past Attempts: (NA) Intentional Self Injurious Behavior: None Family Suicide History: No Recent stressful life event(s): (move) Persecutory voices/beliefs?: No Depression: Yes Depression Symptoms: Insomnia, Tearfulness, Isolating, Fatigue, Loss of interest in usual pleasures, Feeling worthless/self pity Substance abuse history and/or treatment for substance abuse?: No Suicide prevention information given to non-admitted patients: Not applicable  Risk to Others within the past 6 months Homicidal Ideation: No Does patient have any lifetime risk of violence toward others beyond the six months prior to admission? : No Thoughts of Harm to Others: No Current Homicidal Intent: No Current Homicidal Plan: No Access to Homicidal Means: No History of harm to others?: No Assessment of Violence: None Noted Does patient have access to weapons?: No Criminal Charges Pending?: No Does patient have a court date: No Is patient on probation?: No  Psychosis Hallucinations: None noted Delusions: None noted  Mental Status Report Appearance/Hygiene:  Unremarkable Eye Contact: Good Motor Activity: Unremarkable Speech: Logical/coherent Level of Consciousness: Alert Mood: Depressed, Anxious Affect: Anxious, Depressed Anxiety Level: Panic Attacks Panic attack frequency: daily Most recent panic attack: today Thought Processes: Coherent, Relevant Judgement: Unimpaired Orientation: Person, Place, Time, Situation, Appropriate for developmental age Obsessive Compulsive Thoughts/Behaviors: None  Cognitive Functioning Concentration: Fair Memory: Recent Intact, Remote Intact Is patient IDD: No Insight: Good Impulse Control: Good Appetite: Poor Have you had any weight changes? : No Change Sleep: Decreased Total Hours of Sleep: ("a few") Vegetative Symptoms: None  ADLScreening [facility] Assessment Services) Patient's cognitive ability adequate to safely complete daily activities?: Yes Patient able to express need for assistance with ADLs?: Yes Independently performs ADLs?: Yes (appropriate for developmental age)  Prior Inpatient Therapy Prior Inpatient Therapy: No  Prior Outpatient Therapy Prior Outpatient Therapy: No Does patient have an ACCT team?: No Does patient have Intensive In-House Services?  : No Does patient have [facility] services? : No Does patient have P4CC services?: No  ADL Screening (condition at time of admission) Patient's cognitive ability adequate to safely complete daily activities?: Yes Is the patient deaf or have difficulty hearing?: No Does the patient have difficulty seeing, even when wearing glasses/contacts?: No Does the patient have difficulty concentrating, remembering, or making decisions?: No Patient able to express need for assistance with ADLs?: Yes Does the patient have difficulty dressing or bathing?: No Independently performs ADLs?: Yes (appropriate for developmental age) Does the patient have difficulty walking or climbing stairs?: No Weakness of Legs: None Weakness of Arms/Hands: None  Home  Assistive Devices/Equipment Home Assistive Devices/Equipment: None  Therapy Consults (therapy consults require a physician order) PT  Evaluation Needed: No OT Evalulation Needed: No SLP Evaluation Needed: No Abuse/Neglect Assessment (Assessment to be complete while patient is alone) Abuse/Neglect Assessment Can Be Completed: Yes Physical Abuse: Denies Verbal Abuse: Denies Sexual Abuse: Denies Exploitation of patient/patient's resources: Denies Self-Neglect: Denies Values / Beliefs Cultural Requests During Hospitalization: None Spiritual Requests During Hospitalization: None Consults Spiritual Care Consult Needed: No Social Work Consult Needed: No Merchant navy officerAdvance Directives (For Healthcare) Does Patient Have a Medical Advance Directive?: No          Disposition:  Disposition Initial Assessment Completed for this Encounter: Yes Disposition of Patient: Discharge Mode of transportation if patient is discharged/movement?: Car Patient referred to: Outpatient clinic referral  On Site Evaluation by:   Reviewed with Physician:    Theo DillsHull,Kyiesha Millward Hines 03/19/2018 12:34 PM

## 2018-03-19 NOTE — H&P (Signed)
Behavioral Health Medical Screening Exam  Jennifer Salazar is an 38 y.o. female.  Total Time spent with patient: 20 minutes  Psychiatric Specialty Exam: Physical Exam  Nursing note and vitals reviewed. Constitutional: She is oriented to person, place, and time. She appears well-developed and well-nourished.  Cardiovascular: Normal rate.  Respiratory: Effort normal.  Musculoskeletal: Normal range of motion.  Neurological: She is alert and oriented to person, place, and time.  Skin: Skin is warm.    Review of Systems  Constitutional: Negative.   HENT: Negative.   Eyes: Negative.   Respiratory: Negative.   Cardiovascular: Negative.   Gastrointestinal: Negative.   Genitourinary: Negative.   Musculoskeletal: Negative.   Skin: Negative.   Neurological: Negative.   Endo/Heme/Allergies: Negative.   Psychiatric/Behavioral: The patient is nervous/anxious.     Blood pressure (!) 163/114, pulse (!) 111, temperature 98.3 F (36.8 C), resp. rate 16, SpO2 99 %.There is no height or weight on file to calculate BMI.  General Appearance: Casual  Eye Contact:  Good  Speech:  Clear and Coherent and Normal Rate  Volume:  Normal  Mood:  Anxious  Affect:  Tearful  Thought Process:  Coherent and Descriptions of Associations: Intact  Orientation:  Full (Time, Place, and Person)  Thought Content:  WDL  Suicidal Thoughts:  No  Homicidal Thoughts:  No  Memory:  Immediate;   Good Recent;   Good Remote;   Good  Judgement:  Fair  Insight:  Fair  Psychomotor Activity:  Normal  Concentration: Concentration: Good and Attention Span: Good  Recall:  Good  Fund of Knowledge:Good  Language: Good  Akathisia:  No  Handed:  Right  AIMS (if indicated):     Assets:  Communication Skills Desire for Improvement Financial Resources/Insurance Housing Physical Health Social Support Transportation  Sleep:       Musculoskeletal: Strength & Muscle Tone: within normal limits Gait & Station:  normal Patient leans: N/A  Blood pressure (!) 163/114, pulse (!) 111, temperature 98.3 F (36.8 C), resp. rate 16, SpO2 99 %.  Recommendations:  Based on my evaluation the patient does not appear to have an emergency medical condition.  Gerlene Burdock Baxter Gonzalez, FNP 03/19/2018, 12:40 PM

## 2018-03-21 ENCOUNTER — Telehealth (HOSPITAL_COMMUNITY): Payer: Self-pay | Admitting: Psychiatry

## 2018-03-21 NOTE — Telephone Encounter (Signed)
D:  Pt phoned to follow through from assessment she had done yesterday.    According to pt, she really would like to just be put on medication.  A:  Scheduled pt to see Dr. Tenny Craw on 03-26-18 @ 9 a.m; encouraged her to come thirty minutes before to complete paperwork.  R:  Pt receptive.

## 2018-03-22 ENCOUNTER — Ambulatory Visit (INDEPENDENT_AMBULATORY_CARE_PROVIDER_SITE_OTHER): Payer: No Typology Code available for payment source | Admitting: Family Medicine

## 2018-03-22 ENCOUNTER — Other Ambulatory Visit: Payer: Self-pay

## 2018-03-22 ENCOUNTER — Encounter: Payer: Self-pay | Admitting: Family Medicine

## 2018-03-22 ENCOUNTER — Other Ambulatory Visit: Payer: Self-pay | Admitting: Family Medicine

## 2018-03-22 VITALS — BP 130/85 | HR 97 | Temp 98.4°F | Resp 20 | Ht 62.99 in | Wt 180.0 lb

## 2018-03-22 DIAGNOSIS — F411 Generalized anxiety disorder: Secondary | ICD-10-CM

## 2018-03-22 DIAGNOSIS — I10 Essential (primary) hypertension: Secondary | ICD-10-CM | POA: Diagnosis not present

## 2018-03-22 DIAGNOSIS — F410 Panic disorder [episodic paroxysmal anxiety] without agoraphobia: Secondary | ICD-10-CM

## 2018-03-22 DIAGNOSIS — J302 Other seasonal allergic rhinitis: Secondary | ICD-10-CM | POA: Diagnosis not present

## 2018-03-22 MED ORDER — LISINOPRIL 20 MG PO TABS: 20.0000 mg | ORAL_TABLET | Freq: Two times a day (BID) | ORAL | 1 refills | Status: DC

## 2018-03-22 MED ORDER — MONTELUKAST SODIUM 10 MG PO TABS: 10.0000 mg | ORAL_TABLET | Freq: Every day | ORAL | 3 refills | Status: DC

## 2018-03-22 MED ORDER — ATENOLOL 25 MG PO TABS: 12.5000 mg | ORAL_TABLET | Freq: Every day | ORAL | 0 refills | Status: DC

## 2018-03-22 NOTE — Progress Notes (Signed)
2/4/202010:00 AM  Jennifer Salazar 1980/07/10, 38 y.o. female [id number]  Chief Complaint  Patient presents with  . Anxiety    score 7  . Depression    score 8    HPI:   Patient is a 38 y.o. female with past medical history significant for HTN, migraines, GERD, depression/anxiety who presents today for routine followup  Last OV oct 2019 - added chlorthalidone, treated sinus infection Stopped chlorthalidone as it was too drying and making her dizzy Taking lisinopril 87m BID and doing well  Several weeks ago had flare up of sinus issues Having postnasal drip, sneezing, nasal congestion Does not do well with antihistamines at all - causes internal restlessness Sinus issues really causes distress and increased anxiety  Migraines are better, not as frequent  Anxiety is not doing well In the past had anxiety related to relationship issues, did couples therapy, things resolved, did well for years but now  Having increased panic attacks, daily Not been able to go to work this past week Overall really likes her job and stressed she is going to loss her job [employer] was evaluated, has appt with psychiatry this week on Feb 8th Needs FMLA paperwork completed Has tried celexa and lexapro - felt they did not help and made mood worse buspar - made her head dizzy Thinks she might have tried cymbalta  pmp concerning - multiple visits to ER - pain meds given  GAD 7 : Generalized Anxiety Score 03/22/2018  Nervous, Anxious, on Edge 3  Control/stop worrying 2  Worry too much - different things 1  Trouble relaxing 2  Restless 0  Easily annoyed or irritable 1  Afraid - awful might happen 2  Total GAD 7 Score 11  Anxiety Difficulty Not difficult at all      Fall Risk  03/22/2018 12/08/2017  Falls in the past year? 0 No  Number falls in past yr: 0 -  Injury with Fall? 0 -  Follow up Falls evaluation completed -     [facility] 03/22/2018 12/08/2017  Decreased  Interest 1 0  Down, Depressed, Hopeless 1 0  PHQ - 2 Score 2 0  Altered sleeping 2 -  Tired, decreased energy 2 -  Change in appetite 1 -  Feeling bad or failure about yourself  1 -  Trouble concentrating 0 -  Moving slowly or fidgety/restless 0 -  Suicidal thoughts 0 -  PHQ-9 Score 8 -  Difficult doing work/chores Somewhat difficult -    Allergies  Allergen Reactions  . Reglan [Metoclopramide]     Prior to Admission medications   Medication Sig Start Date End Date Taking? Authorizing Provider  butalbital-acetaminophen-caffeine (FIORICET, ESGIC) 50-325-40 MG tablet Take 1 tablet by mouth as needed for migraine. 08/24/17  Yes [provider]  calcium carbonate (TUMS - DOSED IN MG ELEMENTAL CALCIUM) 500 MG chewable tablet Chew 1 tablet by mouth as needed for indigestion or heartburn.   Yes [provider]  cyclobenzaprine (FLEXERIL) 10 MG tablet Take 1 tablet (10 mg total) by mouth 2 (two) times daily as needed for muscle spasms. 09/29/17  Yes Mortis, GJonelle Sports PA-C  ibuprofen (ADVIL,MOTRIN) 200 MG tablet Take 400 mg by mouth every 6 (six) hours as needed for moderate pain.   Yes [provider]  lisinopril (PRINIVIL,ZESTRIL) 20 MG tablet Take 20 mg by mouth 2 (two) times daily.   Yes [provider]  omeprazole (PRILOSEC) 40 MG capsule Take 40 mg by  mouth as needed for heartburn. 10/12/17  Yes [provider]  promethazine (PHENERGAN) 25 MG tablet Take 25 mg by mouth as needed for nausea.  07/30/17  Yes [provider]  amoxicillin-clavulanate (AUGMENTIN) 875-125 MG tablet Take 1 tablet by mouth 2 (two) times daily. Patient not taking: Reported on 03/22/2018 12/08/17   Rutherford Guys, MD  chlorthalidone (HYGROTON) 25 MG tablet Take 1 tablet (25 mg total) by mouth daily. Patient not taking: Reported on 03/22/2018 12/08/17   Rutherford Guys, MD  hydrOXYzine (ATARAX/VISTARIL) 25 MG tablet Take 1 tablet (25 mg total) by mouth 3 (three)  times daily as needed. Patient not taking: Reported on 03/22/2018 12/08/17   Rutherford Guys, MD    Past Medical History:  Diagnosis Date  . Hypertension     Past Surgical History:  Procedure Laterality Date  . WISDOM TOOTH EXTRACTION      Social History   Tobacco Use  . Smoking status: Former Smoker    Packs/day: 0.50    Years: 10.00    Pack years: 5.00    Types: Cigarettes    Last attempt to quit: 11/08/2017    Years since quitting: 0.3  . Smokeless tobacco: Never Used  Substance Use Topics  . Alcohol use: Never    Frequency: Never    Family History  Problem Relation Age of Onset  . Hypertension Mother   . COPD Father   . Crohn's disease Father     ROS Per hpi  OBJECTIVE:  Blood pressure 130/85, pulse 97, temperature 98.4 F (36.9 C), temperature source Oral, resp. rate 20, height 5' 2.99" (1.6 m), weight 180 lb (81.6 kg), SpO2 98 %. Body mass index is 31.89 kg/m.   BP Readings from Last 3 Encounters:  03/22/18 130/85  03/18/18 (!) 155/108  12/08/17 (!) 148/118    Physical Exam Vitals signs and nursing note reviewed.  Constitutional:      Appearance: She is well-developed.  HENT:     Head: Normocephalic and atraumatic.  Eyes:     General: No scleral icterus.    Conjunctiva/sclera: Conjunctivae normal.     Pupils: Pupils are equal, round, and reactive to light.  Neck:     Musculoskeletal: Neck supple.  Pulmonary:     Effort: Pulmonary effort is normal.  Skin:    General: Skin is warm and dry.  Neurological:     Mental Status: She is alert and oriented to person, place, and time.  Psychiatric:        Mood and Affect: Mood is anxious. Affect is tearful.     ASSESSMENT and PLAN  1. Generalized anxiety disorder with panic attacks Uncontrolled. Has tried many meds in the past. Discussed will not start bzd. Trial of low dose atenolol. Has appt with psychiatry.  Released back to work, no more than 4 hours at a time. Will complete FMLA paperwork.     2. Essential hypertension, benign Controlled. Continue current regime.  - Lipid panel - TSH - CMP14+EGFR  3. Seasonal allergic rhinitis, unspecified trigger Starting singulair, discussed adding flonase.  Other orders - lisinopril (PRINIVIL,ZESTRIL) 20 MG tablet; Take 1 tablet (20 mg total) by mouth 2 (two) times daily. - atenolol (TENORMIN) 25 MG tablet; Take 0.5-1 tablets (12.5-25 mg total) by mouth daily. - montelukast (SINGULAIR) 10 MG tablet; Take 1 tablet (10 mg total) by mouth at bedtime.  Return in 6 days (on 03/28/2018) for anxiety.    Rutherford Guys, MD Primary Care at [facility]  [address] [place], [place] [address] Ph.  [phone] Fax [phone]

## 2018-03-22 NOTE — Patient Instructions (Signed)
° ° ° °  If you have lab work done today you will be contacted with your lab results within the next 2 weeks.  If you have not heard from us then please contact us. The fastest way to get your results is to register for My Chart. ° ° °IF you received an x-ray today, you will receive an invoice from [facility]. Please contact [facility] at [phone] with questions or concerns regarding your invoice.  ° °IF you received labwork today, you will receive an invoice from [facility]. Please contact [facility] at [phone] with questions or concerns regarding your invoice.  ° °Our billing staff will not be able to assist you with questions regarding bills from these companies. ° °You will be contacted with the lab results as soon as they are available. The fastest way to get your results is to activate your My Chart account. Instructions are located on the last page of this paperwork. If you have not heard from us regarding the results in 2 weeks, please contact this office. °  ° ° ° °

## 2018-03-23 ENCOUNTER — Telehealth: Payer: Self-pay | Admitting: Family Medicine

## 2018-03-23 LAB — CMP14+EGFR
ALT: 10 IU/L (ref 0–32)
AST: 11 IU/L (ref 0–40)
Albumin/Globulin Ratio: 1.7 (ref 1.2–2.2)
Albumin: 4.3 g/dL (ref 3.8–4.8)
Alkaline Phosphatase: 71 IU/L (ref 39–117)
BUN/Creatinine Ratio: 20 (ref 9–23)
BUN: 14 mg/dL (ref 6–20)
Bilirubin Total: 0.3 mg/dL (ref 0.0–1.2)
CO2: 20 mmol/L (ref 20–29)
Calcium: 9 mg/dL (ref 8.7–10.2)
Chloride: 106 mmol/L (ref 96–106)
Creatinine, Ser: 0.7 mg/dL (ref 0.57–1.00)
GFR calc Af Amer: 128 mL/min/{1.73_m2} (ref >59)
GFR calc non Af Amer: 111 mL/min/{1.73_m2} (ref >59)
Globulin, Total: 2.5 g/dL (ref 1.5–4.5)
Glucose: 103 mg/dL — ABNORMAL HIGH (ref 65–99)
Potassium: 4.5 mmol/L (ref 3.5–5.2)
Sodium: 143 mmol/L (ref 134–144)
Total Protein: 6.8 g/dL (ref 6.0–8.5)

## 2018-03-23 LAB — LIPID PANEL
Chol/HDL Ratio: 4.2 ratio (ref 0.0–4.4)
Cholesterol, Total: 191 mg/dL (ref 100–199)
HDL: 46 mg/dL (ref >39)
LDL Calculated: 127 mg/dL — ABNORMAL HIGH (ref 0–99)
Triglycerides: 92 mg/dL (ref 0–149)
VLDL Cholesterol Cal: 18 mg/dL (ref 5–40)

## 2018-03-23 LAB — TSH: TSH: 2.73 u[IU]/mL (ref 0.450–4.500)

## 2018-03-23 NOTE — Telephone Encounter (Signed)
Pt brought oow note back requiring more speciifics for her employer   Pt would like to pick up   Best  Phone [phone]

## 2018-03-26 ENCOUNTER — Ambulatory Visit (INDEPENDENT_AMBULATORY_CARE_PROVIDER_SITE_OTHER): Payer: No Typology Code available for payment source | Admitting: Psychiatry

## 2018-03-26 ENCOUNTER — Encounter (HOSPITAL_COMMUNITY): Payer: Self-pay | Admitting: Psychiatry

## 2018-03-26 ENCOUNTER — Other Ambulatory Visit: Payer: Self-pay

## 2018-03-26 DIAGNOSIS — F411 Generalized anxiety disorder: Secondary | ICD-10-CM | POA: Diagnosis not present

## 2018-03-26 MED ORDER — SERTRALINE HCL 50 MG PO TABS: 50.0000 mg | ORAL_TABLET | Freq: Every day | ORAL | 2 refills | Status: DC

## 2018-03-26 MED ORDER — CLONAZEPAM 0.5 MG PO TABS
0.5000 mg | ORAL_TABLET | Freq: Every day | ORAL | 1 refills | Status: DC | PRN
Start: 2018-03-26 — End: 2018-03-31

## 2018-03-26 NOTE — Progress Notes (Signed)
Psychiatric Initial Adult Assessment   Patient Identification: Jennifer LoopSarah Salazar MRN:  [id number] Date of Evaluation:  03/26/2018 Referral Source: Dr. Leretha PolSantiago Chief Complaint:   Chief Complaint    Establish Care; Anxiety; Panic Attack; Stress; Depression     Visit Diagnosis:    ICD-10-CM   1. Generalized anxiety disorder F41.1     History of Present Illness: This patient is a 38 year old married white female who lives [place] her [place] in [place].  She has a 38 year old stepson in [place].  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  She [place] her husband moved here in [place] from [place] for his job as a Naval architectrestaurant manager.  The patient is referred by Dr. Leretha PolSantiago, her primary physician for further assessment and treatment of anxiety and panic attacks.  The patient states that she previously went through a period of intense anxiety about 3 to 4 years ago.  At that time she is found out that her husband had a child from a previous relationship and he had never told her about the girl.  They split up for a while and she was having significant anxiety and panic attacks.  They finally worked this out through counseling.  During this time she was tried on several medications including Cymbalta Lexapro and Celexa which either caused side effects or did not work all that well.  BuSpar made her very dizzy.  She was on a low-dose of Xanax which did help at the time.  After few months however she stopped medications and had done well.  She has not had any other psychiatric treatment or hospitalization.  The patient states over the last few weeks her anxiety has resurfaced.  She is not entirely sure why.  She enjoys her job at [employer] but had gotten sick with bronchitis and missed quite a bit of work.  After she tried to go back she started to get intensely anxious and had panic attacks at work.  Is been very difficult for her to go to work and her physician took her out of work for a  while but she now is going to try to go back.  She is also very worried about losing her job because she and her husband declared bankruptcy a few years ago and are making bankruptcy payments.  She feels anxious and overwhelmed has chronic panic attacks excessive worry difficulty sleeping.  She denies being seriously depressed or suicidal.  She actually enjoys her job most of the time.  She denies any psychotic symptoms and does not use drugs or alcohol  Associated Signs/Symptoms: Depression Symptoms:  insomnia, anxiety, panic attacks, disturbed sleep, (Hypo) Manic Symptoms:   Psychotic Symptoms:   PTSD Symptoms: No history of trauma or abuse Anxiety symptoms: Excessive worry, panic attacks Past Psychiatric History: Past outpatient treatment as noted above.  No history of psychiatric hospitalizations suicidal ideation or suicidal attempts  Previous Psychotropic Medications: Yes   Substance Abuse History in the last 12 months:  No.  Consequences of Substance Abuse: Negative  Past Medical History:  Past Medical History:  Diagnosis Date  . Anxiety   . Hypertension     Past Surgical History:  Procedure Laterality Date  . WISDOM TOOTH EXTRACTION      Family Psychiatric History: Father has a history of severe Crohn's disease associated anxiety, sister has a history of substance abuse and brother has a history of anxiety  Family History:  Family History  Problem Relation Age of Onset  . Hypertension  Mother   . COPD Father   . Crohn's disease Father   . Anxiety disorder Father   . Drug abuse Sister   . Anxiety disorder Brother     Social History:   Social History   Socioeconomic History  . Marital status: Married    Spouse name: Otilio Saber  . Number of children: 0  . Years of education: Not on file  . Highest education level: Some college, no degree  Occupational History  . Not on file  Social Needs  . Financial resource strain: Not hard at all  . Food insecurity:     Worry: Never true    Inability: Never true  . Transportation needs:    Medical: No    Non-medical: No  Tobacco Use  . Smoking status: Former Smoker    Packs/day: 0.25    Years: 10.00    Pack years: 2.50    Types: Cigarettes    Last attempt to quit: 11/08/2017    Years since quitting: 0.3  . Smokeless tobacco: Never Used  Substance and Sexual Activity  . Alcohol use: Never    Frequency: Never  . Drug use: Never  . Sexual activity: Yes    Birth control/protection: I.U.D.  Lifestyle  . Physical activity:    Days per week: 0 days    Minutes per session: 0 min  . Stress: Very much  Relationships  . Social connections:    Talks on phone: Not on file    Gets together: Not on file    Attends religious service: Never    Active member of club or organization: No    Attends meetings of clubs or organizations: Never    Relationship status: Married  Other Topics Concern  . Not on file  Social History Narrative  . Not on file    Additional Social History: The patient grew up in [place].  She states that she had an uneventful childhood and was "spoiled" as being the youngest child of 3.  She finished high school and some college but eventually got into working in [employer] and did this for about 20 years until she recently was hired at [employer].  She is been married for 10 years and has no children.  She states that she and her husband have gone through some financial stress and filed for bankruptcy but currently they are doing better as he is now a Naval architect and making better money.  Allergies:   Allergies  Allergen Reactions  . Benadryl [Diphenhydramine] Other (See Comments)    Internal restlessness  . Reglan [Metoclopramide]     Metabolic Disorder Labs: No results found for: HGBA1C, MPG No results found for: PROLACTIN Lab Results  Component Value Date   CHOL 191 03/22/2018   TRIG 92 03/22/2018   HDL 46 03/22/2018   CHOLHDL 4.2 03/22/2018    LDLCALC 127 (H) 03/22/2018   Lab Results  Component Value Date   TSH 2.730 03/22/2018    Therapeutic Level Labs: No results found for: LITHIUM No results found for: CBMZ No results found for: VALPROATE  Current Medications: Current Outpatient Medications  Medication Sig Dispense Refill  . butalbital-acetaminophen-caffeine (FIORICET, ESGIC) 50-325-40 MG tablet Take 1 tablet by mouth as needed for migraine.    Marland Kitchen levonorgestrel (MIRENA) 20 MCG/24HR IUD by Intrauterine route.    Marland Kitchen lisinopril (PRINIVIL,ZESTRIL) 20 MG tablet Take 1 tablet (20 mg total) by mouth 2 (two) times daily. 180 tablet 1  . montelukast (SINGULAIR)  10 MG tablet Take 1 tablet (10 mg total) by mouth at bedtime. 30 tablet 3  . ondansetron (ZOFRAN-ODT) 8 MG disintegrating tablet DISSOLVE 1 TABLET IN MOUTH EVERY 8 HOURS AS NEEDED    . triamcinolone cream (KENALOG) 0.1 % Apply topically.    . clonazePAM (KLONOPIN) 0.5 MG tablet Take 1 tablet (0.5 mg total) by mouth daily as needed for anxiety. 30 tablet 1  . sertraline (ZOLOFT) 50 MG tablet Take 1 tablet (50 mg total) by mouth daily. 30 tablet 2   No current facility-administered medications for this visit.     Musculoskeletal: Strength & Muscle Tone: within normal limits Gait & Station: normal Patient leans: N/A  Psychiatric Specialty Exam: Review of Systems  Neurological: Positive for headaches.  Psychiatric/Behavioral: The patient is nervous/anxious and has insomnia.   All other systems reviewed and are negative.   Blood pressure (!) 141/92, pulse (!) 105, temperature 98.3 F (36.8 C), temperature source Oral, weight 185 lb 9.6 oz (84.2 kg), SpO2 96 %.Body mass index is 32.89 kg/m.  General Appearance: Casual, Neat and Well Groomed  Eye Contact:  Good  Speech:  Clear and Coherent  Volume:  Normal  Mood:  Anxious  Affect:  Congruent and Tearful  Thought Process:  Goal Directed  Orientation:  Full (Time, Place, and Person)  Thought Content:  Rumination   Suicidal Thoughts:  No  Homicidal Thoughts:  No  Memory:  Immediate;   Good Recent;   Good Remote;   Good  Judgement:  Good  Insight:  Good  Psychomotor Activity:  Normal  Concentration:  Concentration: Good and Attention Span: Good  Recall:  Good  Fund of Knowledge:Good  Language: Good  Akathisia:  No  Handed:  Right  AIMS (if indicated):  not done  Assets:  Communication Skills Desire for Improvement Physical Health Resilience Social Support Talents/Skills Vocational/Educational  ADL's:  Intact  Cognition: WNL  Sleep:  Poor   Screenings: GAD-7     Office Visit from 03/22/2018 in Primary Care at [facility]  Total GAD-7 Score  11    PHQ2-9     Office Visit from 03/22/2018 in Primary Care at [facility] Visit from 12/08/2017 in Primary Care at [facility] Total Score  2  0  PHQ-9 Total Score  8  -      Assessment and Plan: This patient is a 38 year old female with a history of generalized anxiety disorder and panic attacks.  She states depression is not as big of an issue right now.  She will start Zoloft 25 mg daily for 2 weeks and then advance to 50 mg daily for the longer term treatment of the anxiety.  For short term relief she will start clonazepam 0.5 mg daily as needed for anxiety.  She will return to see 1 of the physicians in the clinic in 4 weeks or call sooner if needed   Diannia Ruder, MD 2/8/20209:45 AM

## 2018-03-28 ENCOUNTER — Ambulatory Visit (INDEPENDENT_AMBULATORY_CARE_PROVIDER_SITE_OTHER): Payer: No Typology Code available for payment source | Admitting: Family Medicine

## 2018-03-28 ENCOUNTER — Other Ambulatory Visit: Payer: Self-pay

## 2018-03-28 ENCOUNTER — Encounter: Payer: Self-pay | Admitting: Family Medicine

## 2018-03-28 VITALS — BP 118/98 | HR 103 | Temp 98.7°F | Resp 16 | Ht 62.0 in | Wt 180.0 lb

## 2018-03-28 DIAGNOSIS — F411 Generalized anxiety disorder: Secondary | ICD-10-CM

## 2018-03-28 DIAGNOSIS — F410 Panic disorder [episodic paroxysmal anxiety] without agoraphobia: Secondary | ICD-10-CM

## 2018-03-28 DIAGNOSIS — M545 Low back pain, unspecified: Secondary | ICD-10-CM

## 2018-03-28 DIAGNOSIS — G8929 Other chronic pain: Secondary | ICD-10-CM

## 2018-03-28 DIAGNOSIS — I10 Essential (primary) hypertension: Secondary | ICD-10-CM | POA: Diagnosis not present

## 2018-03-28 MED ORDER — AMLODIPINE BESYLATE 5 MG PO TABS: 5.0000 mg | ORAL_TABLET | Freq: Every day | ORAL | 3 refills | Status: DC

## 2018-03-28 NOTE — Progress Notes (Signed)
2/10/202010:31 AM  Jennifer Salazar Feb 03, 1981, 38 y.o. female [id number]  Chief Complaint  Patient presents with  . Anxiety    follow up    HPI:   Patient is a 38 y.o. female with past medical history significant for HTN, migraines, GERD, depression/anxiety who presents today for routine followup  Last OV started on atenolol 25mg  once a day - which she did not start Saw psych 03/26/2018 - started on sertraline and clonazepam, fu 4 weeks  Patient states that appt when overall well  BP is high as she threw her back yesterday while showering Has been taking ibuprofen, flexeril and heating pad Recurring issue   GAD 7 : Generalized Anxiety Score 03/28/2018 03/22/2018  Nervous, Anxious, on Edge 2 3  Control/stop worrying 2 2  Worry too much - different things 2 1  Trouble relaxing 2 2  Restless 1 0  Easily annoyed or irritable 1 1  Afraid - awful might happen 1 2  Total GAD 7 Score 11 11  Anxiety Difficulty Very difficult Not difficult at all     Fall Risk  03/28/2018 03/22/2018 12/08/2017  Falls in the past year? 0 0 No  Number falls in past yr: 0 0 -  Injury with Fall? 0 0 -  Follow up - Falls evaluation completed -     Depression screen [facility] 2/9 03/28/2018 03/22/2018 12/08/2017  Decreased Interest 0 1 0  Down, Depressed, Hopeless 0 1 0  PHQ - 2 Score 0 2 0  Altered sleeping - 2 -  Tired, decreased energy - 2 -  Change in appetite - 1 -  Feeling bad or failure about yourself  - 1 -  Trouble concentrating - 0 -  Moving slowly or fidgety/restless - 0 -  Suicidal thoughts - 0 -  PHQ-9 Score - 8 -  Difficult doing work/chores - Somewhat difficult -    Allergies  Allergen Reactions  . Benadryl [Diphenhydramine] Other (See Comments)    Internal restlessness  . Reglan [Metoclopramide]     Prior to Admission medications   Medication Sig Start Date End Date Taking? Authorizing Provider  butalbital-acetaminophen-caffeine (FIORICET, ESGIC) 50-325-40 MG tablet Take 1  tablet by mouth as needed for migraine. 08/24/17   [provider]  clonazePAM (KLONOPIN) 0.5 MG tablet Take 1 tablet (0.5 mg total) by mouth daily as needed for anxiety. 03/26/18   Myrlene Broker, MD  levonorgestrel (MIRENA) 20 MCG/24HR IUD by Intrauterine route.    [provider]  lisinopril (PRINIVIL,ZESTRIL) 20 MG tablet Take 1 tablet (20 mg total) by mouth 2 (two) times daily. 03/22/18   Myles Lipps, MD  montelukast (SINGULAIR) 10 MG tablet Take 1 tablet (10 mg total) by mouth at bedtime. 03/22/18   Myles Lipps, MD  ondansetron (ZOFRAN-ODT) 8 MG disintegrating tablet DISSOLVE 1 TABLET IN MOUTH EVERY 8 HOURS AS NEEDED 02/14/18   [provider]  sertraline (ZOLOFT) 50 MG tablet Take 1 tablet (50 mg total) by mouth daily. 03/26/18 03/26/19  Myrlene Broker, MD  triamcinolone cream (KENALOG) 0.1 % Apply topically. 01/07/18   [provider]    Past Medical History:  Diagnosis Date  . Anxiety   . Hypertension     Past Surgical History:  Procedure Laterality Date  . WISDOM TOOTH EXTRACTION      Social History   Tobacco Use  . Smoking status: Former Smoker    Packs/day: 0.25    Years: 10.00    Pack years:  2.50    Types: Cigarettes    Last attempt to quit: 11/08/2017    Years since quitting: 0.3  . Smokeless tobacco: Never Used  Substance Use Topics  . Alcohol use: Never    Frequency: Never    Family History  Problem Relation Age of Onset  . Hypertension Mother   . COPD Father   . Crohn's disease Father   . Anxiety disorder Father   . Drug abuse Sister   . Anxiety disorder Brother     ROS Per hpi  OBJECTIVE:  Blood pressure (!) 139/108, pulse (!) 103, temperature 98.7 F (37.1 C), temperature source Oral, resp. rate 16, height 5\' 2"  (1.575 m), weight 180 lb (81.6 kg), SpO2 98 %. Body mass index is 32.92 kg/m.   BP Readings from Last 3 Encounters:  03/28/18 (!) 139/108  03/26/18 (!) 141/92  03/22/18 130/85    Physical  Exam Vitals signs and nursing note reviewed.  Constitutional:      Appearance: She is well-developed.  HENT:     Head: Normocephalic and atraumatic.  Eyes:     General: No scleral icterus.    Conjunctiva/sclera: Conjunctivae normal.     Pupils: Pupils are equal, round, and reactive to light.  Neck:     Musculoskeletal: Neck supple.  Pulmonary:     Effort: Pulmonary effort is normal.  Skin:    General: Skin is warm and dry.  Neurological:     Mental Status: She is alert and oriented to person, place, and time.  Psychiatric:        Mood and Affect: Mood is anxious. Affect is tearful.     ASSESSMENT and PLAN  1. Essential hypertension, benign Not controlled. Adding amlodipine.  - Care order/instruction:  2. Generalized anxiety disorder with panic attacks Managed by psych. Work restrictions to 4 hours a day  3. Chronic bilateral low back pain without sciatica Continue with self care regime and meds. Referring to PT.  - Ambulatory referral to Physical Therapy  Other orders - amLODipine (NORVASC) 5 MG tablet; Take 1 tablet (5 mg total) by mouth daily.  Return in about 4 weeks (around 04/25/2018).    Myles Lipps, MD Primary Care at [facility] [address] [place], [place] [address] Ph.  [phone] Fax [phone]

## 2018-03-28 NOTE — Patient Instructions (Signed)
° ° ° °  If you have lab work done today you will be contacted with your lab results within the next 2 weeks.  If you have not heard from us then please contact us. The fastest way to get your results is to register for My Chart. ° ° °IF you received an x-ray today, you will receive an invoice from [facility]. Please contact [facility] at [phone] with questions or concerns regarding your invoice.  ° °IF you received labwork today, you will receive an invoice from [facility]. Please contact [facility] at [phone] with questions or concerns regarding your invoice.  ° °Our billing staff will not be able to assist you with questions regarding bills from these companies. ° °You will be contacted with the lab results as soon as they are available. The fastest way to get your results is to activate your My Chart account. Instructions are located on the last page of this paperwork. If you have not heard from us regarding the results in 2 weeks, please contact this office. °  ° ° ° °

## 2018-03-29 ENCOUNTER — Telehealth (HOSPITAL_COMMUNITY): Payer: Self-pay | Admitting: *Deleted

## 2018-03-29 NOTE — Telephone Encounter (Signed)
Dr Tenny Craw Patient was seen @ the Saturday clinic 03/26/2018. Left message with front office she has questions  about the medications prescribed?  # [phone]  She has appointment to see Dr Hinton Dyer in the [place] office 04/26/2018

## 2018-03-29 NOTE — Telephone Encounter (Signed)
Would you please call her and find out her questions?Thanks

## 2018-03-29 NOTE — Telephone Encounter (Signed)
Attempted to F/U with patient unable mailbox is full

## 2018-03-31 ENCOUNTER — Telehealth (HOSPITAL_COMMUNITY): Payer: Self-pay | Admitting: *Deleted

## 2018-03-31 ENCOUNTER — Other Ambulatory Visit (HOSPITAL_COMMUNITY): Payer: Self-pay | Admitting: Psychiatry

## 2018-03-31 MED ORDER — ALPRAZOLAM 0.5 MG PO TABS: 0.5000 mg | ORAL_TABLET | Freq: Every day | ORAL | 0 refills | Status: DC | PRN

## 2018-03-31 NOTE — Telephone Encounter (Signed)
MAILBOX IS FULL

## 2018-03-31 NOTE — Telephone Encounter (Signed)
Dr Harrington Challenger Able to  Get in touch with patient. She has concerns about the medication prescribed  The Saturday clinic. And she stated since she hasn't officially met Dr Montel Culver she reached out to you. Her concerns are 1) the Clonazepam is making her feel " Zombie out". It's knocking her out she has tried taking  1/2 tab with same result. . She's drowsy all day long. 2) the Zoloft she's taking the  1/2 tab for 2 weeks the 1 daily. She's still taking the 1/2 tab & can't tell any difference if it's working. She's been taking it will be 1 week come Saturday. Is it possible to start the 1 tab daily?

## 2018-03-31 NOTE — Telephone Encounter (Signed)
sent 

## 2018-03-31 NOTE — Telephone Encounter (Signed)
Mailbox is full. If you can reach her tell her it is ok to start whole pill of zoloft now.  Antidepressants often take up to 4 weeks to work If she would like to stop clonazepam and try something else like xanax which she has take before she needs to let me know

## 2018-03-31 NOTE — Telephone Encounter (Signed)
Dr Tenny Craw Patient said she would like to like to start Xanax again

## 2018-04-13 ENCOUNTER — Other Ambulatory Visit (HOSPITAL_COMMUNITY): Payer: Self-pay | Admitting: Psychiatry

## 2018-04-14 ENCOUNTER — Telehealth (HOSPITAL_COMMUNITY): Payer: Self-pay

## 2018-04-14 NOTE — Telephone Encounter (Addendum)
Patient called requesting a refill on Xanax 0.5 mg tabs. This prescription was sent to the pharmacy on 03/31/2018 for 30 tabs and she is to take 1 tab per day. The patient said that she is currently out of the medicine because she had been taking more than one per day "considering what has been going on with me".  Next appointment is scheduled on 04-26-2018 with Dr. Hinton Dyer in the [place] office. She said that she will discuss her need to take more than one tablet per day of Xanax at her next appointment.

## 2018-04-14 NOTE — Telephone Encounter (Signed)
Will not plan to prescribe refill at this time. Will forward to Dr. Tenny Craw for review when she returns to the clinic.

## 2018-04-14 NOTE — Telephone Encounter (Signed)
And so she will but until then I am not willing to give early refills for controlled substance.  OP

## 2018-04-18 NOTE — Telephone Encounter (Signed)
Agree, she is not my patient, I only saw in the Sat clinic

## 2018-04-26 ENCOUNTER — Ambulatory Visit (INDEPENDENT_AMBULATORY_CARE_PROVIDER_SITE_OTHER): Payer: No Typology Code available for payment source | Admitting: Psychiatry

## 2018-04-26 ENCOUNTER — Encounter (HOSPITAL_COMMUNITY): Payer: Self-pay | Admitting: Psychiatry

## 2018-04-26 VITALS — BP 132/80 | HR 114 | Ht 62.0 in | Wt 180.0 lb

## 2018-04-26 DIAGNOSIS — F410 Panic disorder [episodic paroxysmal anxiety] without agoraphobia: Secondary | ICD-10-CM | POA: Diagnosis not present

## 2018-04-26 DIAGNOSIS — F411 Generalized anxiety disorder: Secondary | ICD-10-CM

## 2018-04-26 MED ORDER — ALPRAZOLAM 0.5 MG PO TABS: 0.5000 mg | ORAL_TABLET | Freq: Two times a day (BID) | ORAL | 1 refills | Status: DC | PRN

## 2018-04-26 NOTE — Progress Notes (Signed)
BH MD/PA/NP OP Progress Note  04/26/2018 10:00 AM Jennifer Salazar  MRN:  [id number]  Chief Complaint: Anxiety HPI: 38 yo married female with generalized and panic type anxiety who has been seen by Dr. Roe Rutherford in Saturday clinic on 03/26/18. She was prescribed sertraline and alprazolam prn anxiety (once daily) but run out of it in two weeks as she was having frequent panic attacks and needed it more frequently than once a day. In the past she tried citalopram, escitalopram and duloxetine with limited benefit (and GI adverse effects). She felt dizzy on buspirone. She had a good response to alprazolam 3-4 years ago and once her anxiety came under control stopped taking it. She moved here from [facility] with her husband who now works locally as a Naval architect. She found a job at [employer] and enjoys it. She cannot identify a stressor that might have triggered  Current anxiety but it may be a combination of recent move and a new job. There is no hx of major depression, mania, psychosis or alcohol/drug abuse. No hx of trauma, abuse.  Visit Diagnosis:    ICD-10-CM   1. Generalized anxiety disorder F41.1   2. Panic disorder F41.0     Past Psychiatric History: Please refer to original H&P.  Past Medical History:  Past Medical History:  Diagnosis Date  . Anxiety   . Hypertension     Past Surgical History:  Procedure Laterality Date  . WISDOM TOOTH EXTRACTION      Family Psychiatric History: reviewed.  Family History:  Family History  Problem Relation Age of Onset  . Hypertension Mother   . COPD Father   . Crohn's disease Father   . Anxiety disorder Father   . Drug abuse Sister   . Anxiety disorder Brother     Social History:  Social History   Socioeconomic History  . Marital status: Married    Spouse name: Otilio Saber  . Number of children: 0  . Years of education: Not on file  . Highest education level: Some college, no degree  Occupational History  . Not on file   Social Needs  . Financial resource strain: Not hard at all  . Food insecurity:    Worry: Never true    Inability: Never true  . Transportation needs:    Medical: No    Non-medical: No  Tobacco Use  . Smoking status: Former Smoker    Packs/day: 0.25    Years: 10.00    Pack years: 2.50    Types: Cigarettes    Last attempt to quit: 11/08/2017    Years since quitting: 0.4  . Smokeless tobacco: Never Used  Substance and Sexual Activity  . Alcohol use: Never    Frequency: Never  . Drug use: Never  . Sexual activity: Yes    Birth control/protection: I.U.D.  Lifestyle  . Physical activity:    Days per week: 0 days    Minutes per session: 0 min  . Stress: Very much  Relationships  . Social connections:    Talks on phone: Not on file    Gets together: Not on file    Attends religious service: Never    Active member of club or organization: No    Attends meetings of clubs or organizations: Never    Relationship status: Married  Other Topics Concern  . Not on file  Social History Narrative  . Not on file    Allergies:  Allergies  Allergen Reactions  .  Benadryl [Diphenhydramine] Other (See Comments)    Internal restlessness  . Reglan [Metoclopramide]     Metabolic Disorder Labs: No results found for: HGBA1C, MPG No results found for: PROLACTIN Lab Results  Component Value Date   CHOL 191 03/22/2018   TRIG 92 03/22/2018   HDL 46 03/22/2018   CHOLHDL 4.2 03/22/2018   LDLCALC 127 (H) 03/22/2018   Lab Results  Component Value Date   TSH 2.730 03/22/2018   TSH 0.998 12/08/2017    Therapeutic Level Labs: No results found for: LITHIUM No results found for: VALPROATE No components found for:  CBMZ  Current Medications: Current Outpatient Medications  Medication Sig Dispense Refill  . ALPRAZolam (XANAX) 0.5 MG tablet Take 1 tablet (0.5 mg total) by mouth daily as needed for anxiety or sleep. 30 tablet 0  . amLODipine (NORVASC) 5 MG tablet Take 1 tablet (5 mg  total) by mouth daily. 30 tablet 3  . butalbital-acetaminophen-caffeine (FIORICET, ESGIC) 50-325-40 MG tablet Take 1 tablet by mouth as needed for migraine.    Marland Kitchen levonorgestrel (MIRENA) 20 MCG/24HR IUD by Intrauterine route.    Marland Kitchen lisinopril (PRINIVIL,ZESTRIL) 20 MG tablet Take 1 tablet (20 mg total) by mouth 2 (two) times daily. 180 tablet 1  . montelukast (SINGULAIR) 10 MG tablet Take 1 tablet (10 mg total) by mouth at bedtime. 30 tablet 3  . ondansetron (ZOFRAN-ODT) 8 MG disintegrating tablet DISSOLVE 1 TABLET IN MOUTH EVERY 8 HOURS AS NEEDED    . sertraline (ZOLOFT) 50 MG tablet Take 1 tablet (50 mg total) by mouth daily. 30 tablet 2  . triamcinolone cream (KENALOG) 0.1 % Apply topically.     No current facility-administered medications for this visit.      Musculoskeletal: Strength & Muscle Tone: within normal limits Gait & Station: normal Patient leans: N/A  Psychiatric Specialty Exam: Review of Systems  Constitutional: Negative.   HENT: Negative.   Eyes: Negative.   Respiratory: Negative.   Cardiovascular: Negative.   Gastrointestinal: Positive for heartburn.  Genitourinary: Negative.   Musculoskeletal: Positive for back pain.  Skin: Negative.   Neurological: Positive for headaches.  Endo/Heme/Allergies: Negative.   Psychiatric/Behavioral: The patient is nervous/anxious and has insomnia.     Height 5\' 2"  (1.575 m), weight 180 lb (81.6 kg).Body mass index is 32.92 kg/m.  General Appearance: Casual  Eye Contact:  Good  Speech:  Clear and Coherent  Volume:  Normal  Mood:  Anxious  Affect:  Congruent  Thought Process:  Goal Directed  Orientation:  Full (Time, Place, and Person)  Thought Content: Logical   Suicidal Thoughts:  No  Homicidal Thoughts:  No  Memory:  Immediate;   Good Recent;   Good Remote;   Good  Judgement:  Fair  Insight:  Fair  Psychomotor Activity:  Normal  Concentration:  Concentration: Good  Recall:  Good  Fund of Knowledge: Good  Language:  Good  Akathisia:  Negative  Handed:  Right  AIMS (if indicated): not done  Assets:  Communication Skills Desire for Improvement Physical Health Resilience Social Support Talents/Skills Vocational/Educational  ADL's:  Intact  Cognition: WNL  Sleep:  Fair   Screenings: GAD-7     Office Visit from 03/28/2018 in Primary Care at [place] Office Visit from 03/22/2018 in Primary Care at [facility]  Total GAD-7 Score  11  11    PHQ2-9     Office Visit from 03/28/2018 in Primary Care at [facility] Visit from 03/22/2018 in Primary Care at [facility] Visit from  12/08/2017 in Primary Care at [facility] Total Score  0  2  0  PHQ-9 Total Score  -  8  -       Assessment and Plan: 38 yo married female with recurrence of generalized and panic type anxiety after recent move to [place] and starting a new job. She still has episodic panic attacks and excessive worrying (about multiple physical ailments/complaints - headaches, heartburn, back ache, kidney function etc). No depression, no SI. Sleep improves when she take alprazolam. Tolerates Zoloft 50 mg fairly well. I will not increase dose because of heartburn. We will continue alprazolam prn anxiety attacks/sleep. Return to clinic in 2 months or prn.   Magdalene Patricia, MD 04/26/2018, 10:00 AM

## 2018-04-29 ENCOUNTER — Ambulatory Visit (INDEPENDENT_AMBULATORY_CARE_PROVIDER_SITE_OTHER): Payer: No Typology Code available for payment source | Admitting: Family Medicine

## 2018-04-29 ENCOUNTER — Other Ambulatory Visit: Payer: Self-pay

## 2018-04-29 ENCOUNTER — Encounter: Payer: Self-pay | Admitting: Family Medicine

## 2018-04-29 VITALS — BP 110/78 | HR 108 | Temp 98.1°F | Ht 62.0 in | Wt 183.4 lb

## 2018-04-29 DIAGNOSIS — K219 Gastro-esophageal reflux disease without esophagitis: Secondary | ICD-10-CM

## 2018-04-29 DIAGNOSIS — F411 Generalized anxiety disorder: Secondary | ICD-10-CM | POA: Diagnosis not present

## 2018-04-29 DIAGNOSIS — R1112 Projectile vomiting: Secondary | ICD-10-CM | POA: Diagnosis not present

## 2018-04-29 DIAGNOSIS — I10 Essential (primary) hypertension: Secondary | ICD-10-CM

## 2018-04-29 DIAGNOSIS — F410 Panic disorder [episodic paroxysmal anxiety] without agoraphobia: Secondary | ICD-10-CM

## 2018-04-29 MED ORDER — OMEPRAZOLE 20 MG PO CPDR: 20.0000 mg | DELAYED_RELEASE_CAPSULE | Freq: Two times a day (BID) | ORAL | 3 refills | Status: DC

## 2018-04-29 NOTE — Progress Notes (Signed)
3/13/202012:09 PM  Jennifer Salazar Jun 09, 1980, 38 y.o. female [id number]  Chief Complaint  Patient presents with  . Anxiety    f/u and both screening has been done  . Depression    HPI:   Patient is a 38 y.o. female with past medical history significant for HTN, migraines, GERD, depression/anxietywho presents today forroutine followup  Last OV feb 2020 Started on amlodipine Seeing pysch, last OV 3 days ago, no changes to meds  Doing better since our last OV Having less difficult days Tolerating amlodipine well Has had occ times when she gets a bit dizzy when she bends over and stands up to quickly Having burning nawing pain hunger in upper abdomen Has h/o gastric ulcers due to too much ibuprofen She however hardly ever takes NSAIDs, takes excedrin once or twice a week Patient reports that she often feels towards the end of the day she has not digested at all, having to vomit large amounts, undigested, projectile, increased of recent Has irregular bowels, recurrent diarrhea, no black tarry stools Father has chrons disease  Fall Risk  04/29/2018 03/28/2018 03/22/2018 12/08/2017  Falls in the past year? 0 0 0 No  Number falls in past yr: 0 0 0 -  Injury with Fall? 0 0 0 -  Follow up Falls evaluation completed - Falls evaluation completed -     Depression screen [facility] 2/9 04/29/2018 03/28/2018 03/22/2018  Decreased Interest 1 0 1  Down, Depressed, Hopeless 0 0 1  PHQ - 2 Score 1 0 2  Altered sleeping 1 - 2  Tired, decreased energy 2 - 2  Change in appetite 2 - 1  Feeling bad or failure about yourself  1 - 1  Trouble concentrating 0 - 0  Moving slowly or fidgety/restless 1 - 0  Suicidal thoughts 0 - 0  PHQ-9 Score 8 - 8  Difficult doing work/chores Somewhat difficult - Somewhat difficult    Allergies  Allergen Reactions  . Benadryl [Diphenhydramine] Other (See Comments)    Internal restlessness  . Reglan [Metoclopramide]     Prior to Admission medications    Medication Sig Start Date End Date Taking? Authorizing Provider  ALPRAZolam Prudy Feeler) 0.5 MG tablet Take 1 tablet (0.5 mg total) by mouth 2 (two) times daily as needed for anxiety or sleep. 04/26/18 04/26/19 Yes Pucilowski, Olgierd A, MD  amLODipine (NORVASC) 5 MG tablet Take 1 tablet (5 mg total) by mouth daily. 03/28/18  Yes Myles Lipps, MD  butalbital-acetaminophen-caffeine (FIORICET, ESGIC) [phone] MG tablet Take 1 tablet by mouth as needed for migraine. 08/24/17  Yes [provider]  levonorgestrel (MIRENA) 20 MCG/24HR IUD by Intrauterine route.   Yes [provider]  lisinopril (PRINIVIL,ZESTRIL) 20 MG tablet Take 1 tablet (20 mg total) by mouth 2 (two) times daily. 03/22/18  Yes Myles Lipps, MD  montelukast (SINGULAIR) 10 MG tablet Take 1 tablet (10 mg total) by mouth at bedtime. 03/22/18  Yes Myles Lipps, MD  ondansetron (ZOFRAN-ODT) 8 MG disintegrating tablet DISSOLVE 1 TABLET IN MOUTH EVERY 8 HOURS AS NEEDED 02/14/18  Yes [provider]  sertraline (ZOLOFT) 50 MG tablet Take 1 tablet (50 mg total) by mouth daily. 03/26/18 03/26/19 Yes Myrlene Broker, MD  triamcinolone cream (KENALOG) 0.1 % Apply topically. 01/07/18  Yes [provider]    Past Medical History:  Diagnosis Date  . Anxiety   . Hypertension     Past Surgical History:  Procedure Laterality Date  . WISDOM TOOTH EXTRACTION  Social History   Tobacco Use  . Smoking status: Former Smoker    Packs/day: 0.25    Years: 10.00    Pack years: 2.50    Types: Cigarettes    Last attempt to quit: 11/08/2017    Years since quitting: 0.4  . Smokeless tobacco: Never Used  Substance Use Topics  . Alcohol use: Never    Frequency: Never    Family History  Problem Relation Age of Onset  . Hypertension Mother   . COPD Father   . Crohn's disease Father   . Anxiety disorder Father   . Drug abuse Sister   . Anxiety disorder Brother     ROS Per hpi  OBJECTIVE:  Blood  pressure 110/78, pulse (!) 108, temperature 98.1 F (36.7 C), temperature source Oral, height 5\' 2"  (1.575 m), weight 183 lb 6.4 oz (83.2 kg), SpO2 99 %. Body mass index is 33.54 kg/m.   Physical Exam Vitals signs and nursing note reviewed.  Constitutional:      Appearance: She is well-developed.  HENT:     Head: Normocephalic and atraumatic.  Eyes:     General: No scleral icterus.    Conjunctiva/sclera: Conjunctivae normal.     Pupils: Pupils are equal, round, and reactive to light.  Neck:     Musculoskeletal: Neck supple.  Pulmonary:     Effort: Pulmonary effort is normal.  Skin:    General: Skin is warm and dry.  Neurological:     Mental Status: She is alert and oriented to person, place, and time.     ASSESSMENT and PLAN  1. Gastroesophageal reflux disease, esophagitis presence not specified - H. pylori breath test - Ambulatory referral to Gastroenterology  2. Projectile vomiting without nausea - Ambulatory referral to Gastroenterology  3. Generalized anxiety disorder with panic attacks Improved. Managed by psych  4. Essential hypertension, benign Controlled. Continue current regime.   Other orders - omeprazole (PRILOSEC) 20 MG capsule; Take 1 capsule (20 mg total) by mouth 2 (two) times daily before a meal.   Return in about 3 months (around 07/30/2018).    Myles Lipps, MD Primary Care at [facility] [address] [place], [place] [address] Ph.  [phone] Fax [phone]

## 2018-04-29 NOTE — Patient Instructions (Signed)
° ° ° °  If you have lab work done today you will be contacted with your lab results within the next 2 weeks.  If you have not heard from us then please contact us. The fastest way to get your results is to register for My Chart. ° ° °IF you received an x-ray today, you will receive an invoice from [facility]. Please contact [facility] at [phone] with questions or concerns regarding your invoice.  ° °IF you received labwork today, you will receive an invoice from [facility]. Please contact [facility] at [phone] with questions or concerns regarding your invoice.  ° °Our billing staff will not be able to assist you with questions regarding bills from these companies. ° °You will be contacted with the lab results as soon as they are available. The fastest way to get your results is to activate your My Chart account. Instructions are located on the last page of this paperwork. If you have not heard from us regarding the results in 2 weeks, please contact this office. °  ° ° ° °

## 2018-05-01 LAB — H. PYLORI BREATH TEST: H pylori Breath Test: NEGATIVE

## 2018-05-01 LAB — H. PYLORI BREATH COLLECTION

## 2018-05-03 ENCOUNTER — Encounter: Payer: Self-pay | Admitting: Family Medicine

## 2018-05-05 ENCOUNTER — Encounter: Payer: Self-pay | Admitting: Family Medicine

## 2018-05-06 NOTE — Telephone Encounter (Signed)
Please provider work release note without restrictions. thanks

## 2018-05-09 NOTE — Telephone Encounter (Signed)
Letter provided 05/06/2018

## 2018-05-10 ENCOUNTER — Telehealth (HOSPITAL_COMMUNITY): Payer: Self-pay

## 2018-05-10 ENCOUNTER — Other Ambulatory Visit (HOSPITAL_COMMUNITY): Payer: Self-pay | Admitting: Psychiatry

## 2018-05-10 MED ORDER — ALPRAZOLAM 0.5 MG PO TABS: 0.5000 mg | ORAL_TABLET | Freq: Three times a day (TID) | ORAL | 0 refills | Status: DC | PRN

## 2018-05-10 NOTE — Telephone Encounter (Signed)
I did increase frequency to tid prn and reordered it. OP

## 2018-05-10 NOTE — Telephone Encounter (Signed)
Patient is calling for an early refill on her Xanax. Patient states that she has taken extra a few times - she said that her anxiety is really high right now because she works in [employer] as well as her husband and because of the increased anxiety she tool some extra and would now like an early fill. Please review and advise, thank you

## 2018-05-11 NOTE — Telephone Encounter (Signed)
I called the patient and let her know.

## 2018-05-24 ENCOUNTER — Telehealth (HOSPITAL_COMMUNITY): Payer: Self-pay

## 2018-05-24 NOTE — Telephone Encounter (Signed)
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 Patient very anxious during the call, crying some and reports at times panic attacks and difficulty breathing.  Patient reported she continues to take her Zoloft as prescribed and does not want to abuse her Xanax use but has at times been taking 1 and 1/2 - 2 several times a day just to calm herself down.  Patient scheduled an earlier appointment with Dr. Hinton Dyer for 05/31/18 at 11:30am but questions if provider and inform her of what to take and how to help control anxiety the most.  Informed Dr. Renelda Loma would not be back in the office this week but would see messages later this week.  Patient stated she could take a day or two off if needed but is just very nervous and anxious currently with her work and also that her husband works in [employer].  Patient to call back if symptoms worsen.

## 2018-05-26 ENCOUNTER — Telehealth (HOSPITAL_COMMUNITY): Payer: Self-pay | Admitting: *Deleted

## 2018-05-26 NOTE — Telephone Encounter (Signed)
Patient called stating that she needs to know what the MD wants her to do regarding her medication. See note from call that was received 05/24/18. She has been taking more than prescribed of Xanax due to anxiety of this pandemic. Asking if she should increase her Zoloft or what medication will help her. She is afraid she will run out of Xanax because she is taking an extra dose and needs something to help. She worked at [employer] but has since taken medical leave due to the coronavirus. She was having to wear a mask while working and it was difficult to breath which caused panic episodes.

## 2018-05-27 ENCOUNTER — Other Ambulatory Visit (HOSPITAL_COMMUNITY): Payer: Self-pay | Admitting: Psychiatry

## 2018-05-27 MED ORDER — ALPRAZOLAM 1 MG PO TABS: 1.0000 mg | ORAL_TABLET | Freq: Three times a day (TID) | ORAL | 0 refills | Status: DC | PRN

## 2018-05-27 NOTE — Telephone Encounter (Signed)
I have increased Zoloft to 75 mg and alprazolam to 1 mg tid prn anxiety. These changes were discussed with Jennifer Salazar and she will speak with me agian in 5 days (regular appointment).

## 2018-05-31 ENCOUNTER — Other Ambulatory Visit: Payer: Self-pay

## 2018-05-31 ENCOUNTER — Ambulatory Visit (HOSPITAL_COMMUNITY): Payer: No Typology Code available for payment source | Admitting: Psychiatry

## 2018-06-02 ENCOUNTER — Ambulatory Visit (INDEPENDENT_AMBULATORY_CARE_PROVIDER_SITE_OTHER): Payer: No Typology Code available for payment source | Admitting: Psychiatry

## 2018-06-02 ENCOUNTER — Other Ambulatory Visit: Payer: Self-pay

## 2018-06-02 DIAGNOSIS — F410 Panic disorder [episodic paroxysmal anxiety] without agoraphobia: Secondary | ICD-10-CM | POA: Diagnosis not present

## 2018-06-02 DIAGNOSIS — F411 Generalized anxiety disorder: Secondary | ICD-10-CM

## 2018-06-02 MED ORDER — ALPRAZOLAM 1 MG PO TABS: 1.0000 mg | ORAL_TABLET | Freq: Three times a day (TID) | ORAL | 0 refills | Status: DC | PRN

## 2018-06-02 NOTE — Progress Notes (Signed)
BH MD/PA/NP OP Progress Note  06/02/2018 10:19 AM Jennifer Salazar  MRN:  [id number]  Interview was conducted using teleconferencing and I verified that I was speaking with the correct person using two identifiers. I discussed the limitations of evaluation and management by telemedicine and  the availability of in person appointments. Patient expressed understanding and agreed to proceed.  Chief Complaint: Anxiety.  HPI: 38 yo married female with recurrence of generalized and panic type anxiety after recent move to [place] and starting a new job. She still has episodic panic attacks and excessive worrying (about multiple physical ailments/complaints - headaches, heartburn, back ache, kidney function etc). No depression, no SI. Sleep improves when she take alprazolam. Tolerates Zoloft 50 mg fairly well but remains anxious and we had to increase alprazolam to 1 mg tid per panic attacks which have intensified since stay home order because of corona virus was issued (she works at [employer]).   Visit Diagnosis:    ICD-10-CM   1. Generalized anxiety disorder F41.1   2. Panic disorder F41.0     Past Psychiatric History: Please see intake H&P.  Past Medical History:  Past Medical History:  Diagnosis Date  . Anxiety   . Hypertension     Past Surgical History:  Procedure Laterality Date  . WISDOM TOOTH EXTRACTION      Family Psychiatric History: Reviewed.  Family History:  Family History  Problem Relation Age of Onset  . Hypertension Mother   . COPD Father   . Crohn's disease Father   . Anxiety disorder Father   . Drug abuse Sister   . Anxiety disorder Brother     Social History:  Social History   Socioeconomic History  . Marital status: Married    Spouse name: Otilio Saber  . Number of children: 0  . Years of education: Not on file  . Highest education level: Some college, no degree  Occupational History  . Not on file  Social Needs  . Financial resource strain: Not hard at  all  . Food insecurity:    Worry: Never true    Inability: Never true  . Transportation needs:    Medical: No    Non-medical: No  Tobacco Use  . Smoking status: Former Smoker    Packs/day: 0.25    Years: 10.00    Pack years: 2.50    Types: Cigarettes    Last attempt to quit: 11/08/2017    Years since quitting: 0.5  . Smokeless tobacco: Never Used  Substance and Sexual Activity  . Alcohol use: Never    Frequency: Never  . Drug use: Never  . Sexual activity: Yes    Birth control/protection: I.U.D.  Lifestyle  . Physical activity:    Days per week: 0 days    Minutes per session: 0 min  . Stress: Very much  Relationships  . Social connections:    Talks on phone: Not on file    Gets together: Not on file    Attends religious service: Never    Active member of club or organization: No    Attends meetings of clubs or organizations: Never    Relationship status: Married  Other Topics Concern  . Not on file  Social History Narrative  . Not on file    Allergies:  Allergies  Allergen Reactions  . Benadryl [Diphenhydramine] Other (See Comments)    Internal restlessness  . Reglan [Metoclopramide]     Metabolic Disorder Labs: No results found for: HGBA1C, MPG No  results found for: PROLACTIN Lab Results  Component Value Date   CHOL 191 03/22/2018   TRIG 92 03/22/2018   HDL 46 03/22/2018   CHOLHDL 4.2 03/22/2018   LDLCALC 127 (H) 03/22/2018   Lab Results  Component Value Date   TSH 2.730 03/22/2018   TSH 0.998 12/08/2017    Therapeutic Level Labs: No results found for: LITHIUM No results found for: VALPROATE No components found for:  CBMZ  Current Medications: Current Outpatient Medications  Medication Sig Dispense Refill  . ALPRAZolam (XANAX) 1 MG tablet Take 1 tablet (1 mg total) by mouth 3 (three) times daily as needed for up to 30 days for anxiety or sleep. 90 tablet 0  . amLODipine (NORVASC) 5 MG tablet Take 1 tablet (5 mg total) by mouth daily. 30  tablet 3  . butalbital-acetaminophen-caffeine (FIORICET, ESGIC) 50-325-40 MG tablet Take 1 tablet by mouth as needed for migraine.    Marland Kitchen levonorgestrel (MIRENA) 20 MCG/24HR IUD by Intrauterine route.    Marland Kitchen lisinopril (PRINIVIL,ZESTRIL) 20 MG tablet Take 1 tablet (20 mg total) by mouth 2 (two) times daily. 180 tablet 1  . montelukast (SINGULAIR) 10 MG tablet Take 1 tablet (10 mg total) by mouth at bedtime. 30 tablet 3  . omeprazole (PRILOSEC) 20 MG capsule Take 1 capsule (20 mg total) by mouth 2 (two) times daily before a meal. 60 capsule 3  . ondansetron (ZOFRAN-ODT) 8 MG disintegrating tablet DISSOLVE 1 TABLET IN MOUTH EVERY 8 HOURS AS NEEDED    . sertraline (ZOLOFT) 50 MG tablet Take 1 tablet (50 mg total) by mouth daily. 30 tablet 2  . triamcinolone cream (KENALOG) 0.1 % Apply topically.     No current facility-administered medications for this visit.      Psychiatric Specialty Exam: Review of Systems  Gastrointestinal: Positive for heartburn.  Neurological: Positive for headaches.  Psychiatric/Behavioral: The patient is nervous/anxious.   All other systems reviewed and are negative.   There were no vitals taken for this visit.There is no height or weight on file to calculate BMI.  General Appearance: NA  Eye Contact:  NA  Speech:  Clear and Coherent  Volume:  Normal  Mood:  Anxious  Affect:  NA  Thought Process:  Goal Directed  Orientation:  Full (Time, Place, and Person)  Thought Content: Logical   Suicidal Thoughts:  No  Homicidal Thoughts:  No  Memory:  Immediate;   Good Recent;   Good Remote;   Good  Judgement:  Intact  Insight:  Good  Psychomotor Activity:  NA  Concentration:  Concentration: Good  Recall:  Good  Fund of Knowledge: Good  Language: Good  Akathisia:  NA  Handed:  Right  AIMS (if indicated): not done  Assets:  Communication Skills Desire for Improvement Financial Resources/Insurance Housing Social Support Vocational/Educational  ADL's:  Intact   Cognition: WNL  Sleep:  Fair   Screenings: GAD-7     Office Visit from 04/29/2018 in Primary Care at [facility] Office Visit from 03/28/2018 in Primary Care at [facility] Visit from 03/22/2018 in Primary Care at [facility]  Total GAD-7 Score  PHQ2-9     Office Visit from 04/29/2018 in Primary Care at [facility] Visit from 03/28/2018 in Primary Care at [facility] Visit from 03/22/2018 in Primary Care at [facility] Visit from 12/08/2017 in Primary Care at [facility] Total Score  1  0  2  0  PHQ-9 Total Score  8  -  8  -       Assessment and Plan: 38 yo married female with recurrence of generalized and panic type anxiety after recent move to [place] and starting a new job. She still has episodic panic attacks and excessive worrying (about multiple physical ailments/complaints - headaches, heartburn, back ache, kidney function etc). No depression, no SI. Sleep improves when she take alprazolam. Tolerates Zoloft 50 mg fairly well but remains anxious and we had to increase alprazolam to 1 mg tid per panic attacks which have intensified since stay home order because of corona virus was issued (she works at [employer]). Plan: Continue alprazolam 1 mg tid pen anxiety/sleep. Attempt to increase dose of Zoloft to 75 mg dailt x 1-2 weeks then up to 100 mg daily. Return to clinic in 6 weeks.   Magdalene Patricialgierd A Sandrea Boer, MD 06/02/2018, 10:19 AM

## 2018-06-16 ENCOUNTER — Encounter: Payer: Self-pay | Admitting: Gastroenterology

## 2018-06-16 ENCOUNTER — Other Ambulatory Visit: Payer: Self-pay

## 2018-06-16 ENCOUNTER — Other Ambulatory Visit: Payer: Self-pay | Admitting: Gastroenterology

## 2018-06-16 ENCOUNTER — Ambulatory Visit (INDEPENDENT_AMBULATORY_CARE_PROVIDER_SITE_OTHER): Payer: No Typology Code available for payment source | Admitting: Gastroenterology

## 2018-06-16 VITALS — Ht 62.0 in | Wt 183.0 lb

## 2018-06-16 DIAGNOSIS — R110 Nausea: Secondary | ICD-10-CM | POA: Diagnosis not present

## 2018-06-16 DIAGNOSIS — R1084 Generalized abdominal pain: Secondary | ICD-10-CM

## 2018-06-16 MED ORDER — SUCRALFATE 1 G PO TABS: 1.0000 g | ORAL_TABLET | Freq: Three times a day (TID) | ORAL | 0 refills | Status: DC

## 2018-06-16 MED ORDER — PANTOPRAZOLE SODIUM 40 MG PO TBEC: 40.0000 mg | DELAYED_RELEASE_TABLET | Freq: Every day | ORAL | 3 refills | Status: DC

## 2018-06-16 NOTE — Progress Notes (Signed)
TELEHEALTH VISIT  Referring Provider: Myles Lipps, MD Primary Care Physician:  Myles Lipps, MD   Tele-visit due to COVID-19 pandemic Patient requested visit virtually, consented to the virtual encounter via video enabled telemedicine application (Zoom) Contact made at: 03:55 PM 06/16/18 Patient verified by name and date of birth Location of patient: Home Location provider: [place] [facility] Names of persons participating: Me, patient, Deneise Lever CMA Time spent on telehealth visit: 34 minutes I discussed the limitations of evaluation and management by telemedicine. The patient expressed understanding and agreed to proceed.  Reason for Consultation:  Nausea and diarrhea   IMPRESSION:  Abdominal pain and vomiting    -H. pylori breath test -04/29/2018 History of NSAID-related PUD Father with Crohn's disease Interstitial cystitis  Differential includes, but is not limited to, reflux esophagitis, gastritis, NSAID-related PUD, nonulcer dyspepsia. Also gastroparesis, biliary colic, and celiac.   PLAN: Pantoprazole 40 mg daily x 8 weeks Carafate 1g slurry QID x 2 weeks Avoid all NSAIDs EGD with duodenal biopsies on return to [facility] when the Covid19 restrictions are lifted I asked her to contact Dr. Leretha Pol about her back pain and urinary symptoms  I consented the patient at the bedside today discussing the risks, benefits, and alternatives to endoscopic evaluation. In particular, we discussed the risks that include, but are not limited to, reaction to medication, cardiopulmonary compromise, bleeding requiring blood transfusion, aspiration resulting in pneumonia, perforation requiring surgery, lack of diagnosis, severe illness requiring hospitalization, and even death. We reviewed the risk of missed lesion including polyps or even cancer. The patient acknowledges these risks and asks that we proceed.  HPI: Jerris Wojtkowski is a 38 y.o. works at [employer] seen in  consultation at the request of Dr. Leretha Pol for further evaluation of abdominal pain and vomiting.  The history is obtained to the patient and review of her electronic health record. Her father died two days ago and she is currently in [place] with the rest of her family.  On leave from [employer] but started having panic attacks when she had to start wearing face masks at work.  GI symptoms have exacerbated since that time.  Has a history of interstitial cystitis diagnosed by a urologist in [place] a few years ago.   She reports multiple digestive issues. Intermittent, burning, gnawing pain in the upper abdominal pain that she attributes to ibuprofen (based on past experience). However, she continues to have the pain without using any NSAIDs. Although then noted taking Excedrin once or twice a week. Pain becoming more frequent and worrisome. Only new med is Zoloft.  Frequent GERD with regurgitation. Feels better after regurgitation.  Uses Tums, Rolaoids and omeprazole until a few days ago when she felt like her mouth was feeling weird. She was concerned that more reflux was coming up despite the therapy. No other associated symptoms. No identified exacerbating or relieving features.   Frequent diarrhea x years. Rarely has a formed BM. Frequent urgency and cramping.  Alternating with prolonged defecation with associated cramping, although not constipation. Feels like a temporary blockage.   Having difficulty urinating. Wonders if stress is exacerbating her interstitial cystitis. This feels different from prior UTIs. Feels like her back is on fire.   Poor appetite but under significant stress.   No prior abdominal imaging or endoscopy.  She had a normal comprehensive metabolic panel and TSH 03/22/2018. H. pylori breath test was negative 04/29/2018.  Mother had a perforated ulcer. Brother with IBS. Her father had Crohn's  disease requiring an ileostomy. No known family history of colon cancer or  polyps. No family history of uterine/endometrial cancer, pancreatic cancer or gastric/stomach cancer.  Past Medical History:  Diagnosis Date  . Anxiety   . Hypertension     Past Surgical History:  Procedure Laterality Date  . WISDOM TOOTH EXTRACTION      Current Outpatient Medications  Medication Sig Dispense Refill  . ALPRAZolam (XANAX) 1 MG tablet Take 1 tablet (1 mg total) by mouth 3 (three) times daily as needed for up to 30 days for anxiety or sleep. 90 tablet 0  . amLODipine (NORVASC) 5 MG tablet Take 1 tablet (5 mg total) by mouth daily. 30 tablet 3  . butalbital-acetaminophen-caffeine (FIORICET, ESGIC) 50-325-40 MG tablet Take 1 tablet by mouth as needed for migraine.    Marland Kitchen. levonorgestrel (MIRENA) 20 MCG/24HR IUD by Intrauterine route.    Marland Kitchen. lisinopril (PRINIVIL,ZESTRIL) 20 MG tablet Take 1 tablet (20 mg total) by mouth 2 (two) times daily. 180 tablet 1  . montelukast (SINGULAIR) 10 MG tablet Take 1 tablet (10 mg total) by mouth at bedtime. 30 tablet 3  . ondansetron (ZOFRAN-ODT) 8 MG disintegrating tablet DISSOLVE 1 TABLET IN MOUTH EVERY 8 HOURS AS NEEDED    . sertraline (ZOLOFT) 50 MG tablet Take 1 tablet (50 mg total) by mouth daily. 30 tablet 2  . triamcinolone cream (KENALOG) 0.1 % Apply topically.     No current facility-administered medications for this visit.     Allergies as of 06/16/2018 - Review Complete 06/16/2018  Allergen Reaction Noted  . Benadryl [diphenhydramine] Other (See Comments) 03/22/2018  . Reglan [metoclopramide]  09/29/2017    Family History  Problem Relation Age of Onset  . Hypertension Mother   . COPD Father   . Crohn's disease Father   . Anxiety disorder Father   . Drug abuse Sister   . Anxiety disorder Brother     Social History   Socioeconomic History  . Marital status: Married    Spouse name: Otilio Sabertravis smith  . Number of children: 0  . Years of education: Not on file  . Highest education level: Some college, no degree   Occupational History  . Not on file  Social Needs  . Financial resource strain: Not hard at all  . Food insecurity:    Worry: Never true    Inability: Never true  . Transportation needs:    Medical: No    Non-medical: No  Tobacco Use  . Smoking status: Former Smoker    Packs/day: 0.25    Years: 10.00    Pack years: 2.50    Types: Cigarettes    Last attempt to quit: 11/08/2017    Years since quitting: 0.6  . Smokeless tobacco: Never Used  Substance and Sexual Activity  . Alcohol use: Never    Frequency: Never  . Drug use: Never  . Sexual activity: Yes    Birth control/protection: I.U.D.  Lifestyle  . Physical activity:    Days per week: 0 days    Minutes per session: 0 min  . Stress: Very much  Relationships  . Social connections:    Talks on phone: Not on file    Gets together: Not on file    Attends religious service: Never    Active member of club or organization: No    Attends meetings of clubs or organizations: Never    Relationship status: Married  . Intimate partner violence:    Fear of current or ex  partner: No    Emotionally abused: No    Physically abused: No    Forced sexual activity: No  Other Topics Concern  . Not on file  Social History Narrative  . Not on file    Review of Systems: ALL ROS discussed and all others negative except listed in HPI.  Physical Exam: General: in no acute distress but tearful during the entire encounter Neuro: Alert and appropriate Psych: Normal affect and normal insight   Jovee Dettinger L. Orvan Falconer, MD, MPH [facility] 06/16/2018, 12:53 PM

## 2018-06-16 NOTE — Patient Instructions (Addendum)
I am recommending pantoprazole 40 mg daily x 8 weeks and carafate 1g slurry four times a day before meals and at bedtime x 2 weeks. This is meant to coat your stomach so it is very important you take it on an empty stomach and not with any other medications.    We will send the script to [facility] in [place], [place]. [address] at [place] [facility] [address] Phone number [phone].  Avoid all NSAIDs.  EGD on return to [facility] when the Covid19 restrictions are lifted.  I asked her to contact Dr. Leretha Pol about her back pain and urinary symptoms.  Thank you for your patience with me and our technology today! I look forward to meeting you in person in the future.

## 2018-06-28 ENCOUNTER — Ambulatory Visit (HOSPITAL_COMMUNITY): Payer: No Typology Code available for payment source | Admitting: Psychiatry

## 2018-07-02 ENCOUNTER — Other Ambulatory Visit: Payer: Self-pay

## 2018-07-02 ENCOUNTER — Encounter (HOSPITAL_COMMUNITY): Payer: Self-pay

## 2018-07-02 ENCOUNTER — Emergency Department (HOSPITAL_COMMUNITY)
Admission: EM | Admit: 2018-07-02 | Discharge: 2018-07-02 | Disposition: A | Discharge status: Home / Self Care | Payer: No Typology Code available for payment source | Attending: Emergency Medicine | Admitting: Emergency Medicine

## 2018-07-02 DIAGNOSIS — E860 Dehydration: Secondary | ICD-10-CM | POA: Insufficient documentation

## 2018-07-02 DIAGNOSIS — E876 Hypokalemia: Secondary | ICD-10-CM | POA: Diagnosis not present

## 2018-07-02 DIAGNOSIS — H81399 Other peripheral vertigo, unspecified ear: Secondary | ICD-10-CM | POA: Diagnosis not present

## 2018-07-02 DIAGNOSIS — R42 Dizziness and giddiness: Secondary | ICD-10-CM | POA: Diagnosis present

## 2018-07-02 LAB — CBC WITH DIFFERENTIAL/PLATELET
Abs Immature Granulocytes: 0.03 10*3/uL (ref 0.00–0.07)
Basophils Absolute: 0 10*3/uL (ref 0.0–0.1)
Basophils Relative: 0 %
Eosinophils Absolute: 0.3 10*3/uL (ref 0.0–0.5)
Eosinophils Relative: 3 %
HCT: 42.5 % (ref 36.0–46.0)
Hemoglobin: 14.4 g/dL (ref 12.0–15.0)
Immature Granulocytes: 0 %
Lymphocytes Relative: 40 %
Lymphs Abs: 3.9 10*3/uL (ref 0.7–4.0)
MCH: 32 pg (ref 26.0–34.0)
MCHC: 33.9 g/dL (ref 30.0–36.0)
MCV: 94.4 fL (ref 80.0–100.0)
Monocytes Absolute: 0.9 10*3/uL (ref 0.1–1.0)
Monocytes Relative: 9 %
Neutro Abs: 4.7 10*3/uL (ref 1.7–7.7)
Neutrophils Relative %: 48 %
Platelets: 457 10*3/uL — ABNORMAL HIGH (ref 150–400)
RBC: 4.5 MIL/uL (ref 3.87–5.11)
RDW: 12.2 % (ref 11.5–15.5)
WBC: 9.9 10*3/uL (ref 4.0–10.5)
nRBC: 0 % (ref 0.0–0.2)

## 2018-07-02 LAB — COMPREHENSIVE METABOLIC PANEL
ALT: 18 U/L (ref 0–44)
AST: 38 U/L (ref 15–41)
Albumin: 4.1 g/dL (ref 3.5–5.0)
Alkaline Phosphatase: 73 U/L (ref 38–126)
Anion gap: 18 — ABNORMAL HIGH (ref 5–15)
BUN: 11 mg/dL (ref 6–20)
CO2: 18 mmol/L — ABNORMAL LOW (ref 22–32)
Calcium: 9.3 mg/dL (ref 8.9–10.3)
Chloride: 101 mmol/L (ref 98–111)
Creatinine, Ser: 1.07 mg/dL — ABNORMAL HIGH (ref 0.44–1.00)
GFR calc Af Amer: >60 mL/min (ref >60)
GFR calc non Af Amer: >60 mL/min (ref >60)
Glucose, Bld: 179 mg/dL — ABNORMAL HIGH (ref 70–99)
Potassium: 2.9 mmol/L — ABNORMAL LOW (ref 3.5–5.1)
Sodium: 137 mmol/L (ref 135–145)
Total Bilirubin: 0.8 mg/dL (ref 0.3–1.2)
Total Protein: 7.7 g/dL (ref 6.5–8.1)

## 2018-07-02 LAB — URINALYSIS, ROUTINE W REFLEX MICROSCOPIC
Bacteria, UA: NONE SEEN
Bilirubin Urine: NEGATIVE
Glucose, UA: NEGATIVE mg/dL
Ketones, ur: 20 mg/dL — AB
Nitrite: NEGATIVE
Protein, ur: NEGATIVE mg/dL
Specific Gravity, Urine: 1.023 (ref 1.005–1.030)
pH: 5 (ref 5.0–8.0)

## 2018-07-02 LAB — LIPASE, BLOOD: Lipase: 33 U/L (ref 11–51)

## 2018-07-02 LAB — I-STAT BETA HCG BLOOD, ED (MC, WL, AP ONLY): I-stat hCG, quantitative: <5 m[IU]/mL (ref <5)

## 2018-07-02 MED ORDER — ONDANSETRON HCL 4 MG PO TABS: 4.0000 mg | ORAL_TABLET | Freq: Four times a day (QID) | ORAL | 0 refills | Status: DC

## 2018-07-02 MED ORDER — SODIUM CHLORIDE 0.9 % IV SOLN
Freq: Once | INTRAVENOUS | Status: AC
  Administered 2018-07-02: 12:00:00 via INTRAVENOUS

## 2018-07-02 MED ORDER — MECLIZINE HCL 25 MG PO TABS: 25.0000 mg | ORAL_TABLET | Freq: Three times a day (TID) | ORAL | 0 refills | Status: DC | PRN

## 2018-07-02 MED ORDER — POTASSIUM CHLORIDE 10 MEQ/100ML IV SOLN
10.0000 meq | INTRAVENOUS | Status: AC
  Administered 2018-07-02 (×2): 10 meq via INTRAVENOUS
  Filled 2018-07-02 (×2): qty 100

## 2018-07-02 MED ORDER — SODIUM CHLORIDE 0.9 % IV BOLUS
1000.0000 mL | Freq: Once | INTRAVENOUS | Status: AC
  Administered 2018-07-02: 11:00:00 1000 mL via INTRAVENOUS

## 2018-07-02 MED ORDER — POTASSIUM CHLORIDE CRYS ER 20 MEQ PO TBCR: 20.0000 meq | EXTENDED_RELEASE_TABLET | Freq: Every day | ORAL | 0 refills | Status: DC

## 2018-07-02 MED ORDER — MECLIZINE HCL 25 MG PO TABS
25.0000 mg | ORAL_TABLET | Freq: Once | ORAL | Status: AC
  Administered 2018-07-02: 13:00:00 25 mg via ORAL
  Filled 2018-07-02: qty 1

## 2018-07-02 MED ORDER — ONDANSETRON HCL 4 MG/2ML IJ SOLN
4.0000 mg | Freq: Once | INTRAMUSCULAR | Status: AC
  Administered 2018-07-02: 4 mg via INTRAVENOUS
  Filled 2018-07-02: qty 2

## 2018-07-02 MED ORDER — LORAZEPAM 2 MG/ML IJ SOLN
0.5000 mg | Freq: Once | INTRAMUSCULAR | Status: AC
  Administered 2018-07-02: 11:00:00 0.5 mg via INTRAVENOUS
  Filled 2018-07-02: qty 1

## 2018-07-02 NOTE — ED Provider Notes (Signed)
[facility]-EMERGENCY DEPT Provider Note   CSN: [id number] Arrival date & time: 07/02/18  1008    History   Chief Complaint Chief Complaint  Patient presents with  . GI Bleeding  . N/V    HPI Jennifer Salazar is a 38 y.o. female.     HPI Patient presents with acute onset dizziness which she describes as feeling off balance.  Difficulty focusing on a stationary object.  Associated nausea and vomiting.  Denies hearing changes.  No focal weakness or numbness.  Denies abdominal pain.  States she has had a small amount of red blood in her stool which she attributes to recurrent hemorrhoidal bleeding.  Patient states she is feeling quite anxious. Past Medical History:  Diagnosis Date  . Anxiety   . Hypertension     Patient Active Problem List   Diagnosis Date Noted  . Panic disorder 04/26/2018  . Generalized anxiety disorder 03/26/2018  . Bladder problem 11/06/2016  . Hypertensive disorder 11/06/2016  . Gastroesophageal reflux disease without esophagitis 11/06/2016    Past Surgical History:  Procedure Laterality Date  . WISDOM TOOTH EXTRACTION       OB History   No obstetric history on file.      Home Medications    Prior to Admission medications   Medication Sig Start Date End Date Taking? Authorizing Provider  ALPRAZolam Prudy Feeler) 1 MG tablet Take 1 tablet (1 mg total) by mouth 3 (three) times daily as needed for up to 30 days for anxiety or sleep. Patient taking differently: Take 0.5-1 mg by mouth 3 (three) times daily as needed for anxiety or sleep.  06/02/18 07/02/18 Yes Pucilowski, Olgierd A, MD  butalbital-acetaminophen-caffeine (FIORICET, ESGIC) 50-325-40 MG tablet Take 1 tablet by mouth as needed for migraine. 08/24/17  Yes [provider]  levonorgestrel (MIRENA) 20 MCG/24HR IUD by Intrauterine route.   Yes [provider]  lisinopril (PRINIVIL,ZESTRIL) 20 MG tablet Take 1 tablet (20 mg total) by mouth 2 (two) times daily.  03/22/18  Yes Myles Lipps, MD  sertraline (ZOLOFT) 50 MG tablet Take 1 tablet (50 mg total) by mouth daily. 03/26/18 03/26/19 Yes Myrlene Broker, MD  triamcinolone cream (KENALOG) 0.1 % Apply 1 application topically 2 (two) times daily.  01/07/18  Yes [provider]  amLODipine (NORVASC) 5 MG tablet Take 1 tablet (5 mg total) by mouth daily. Patient not taking: Reported on 07/02/2018 03/28/18   Myles Lipps, MD  meclizine (ANTIVERT) 25 MG tablet Take 1 tablet (25 mg total) by mouth 3 (three) times daily as needed for dizziness. 07/02/18   Loren Racer, MD  montelukast (SINGULAIR) 10 MG tablet Take 1 tablet (10 mg total) by mouth at bedtime. Patient not taking: Reported on 07/02/2018 03/22/18   Myles Lipps, MD  ondansetron (ZOFRAN) 4 MG tablet Take 1 tablet (4 mg total) by mouth every 6 (six) hours. 07/02/18   Loren Racer, MD  pantoprazole (PROTONIX) 40 MG tablet Take 1 tablet (40 mg total) by mouth daily. Patient not taking: Reported on 07/02/2018 06/16/18   Tressia Danas, MD  potassium chloride SA (K-DUR) 20 MEQ tablet Take 1 tablet (20 mEq total) by mouth daily. 07/03/18   Loren Racer, MD  sucralfate (CARAFATE) 1 g tablet TAKE 1 TABLET BY MOUTH FOUR TIMES DAILY WITH MEALS AND AT BEDTIME FOR 14 DAYS Patient not taking: Reported on 07/02/2018 06/17/18   Tressia Danas, MD    Family History Family History  Problem Relation Age of Onset  .  Hypertension Mother   . COPD Father   . Crohn's disease Father   . Anxiety disorder Father   . Drug abuse Sister   . Anxiety disorder Brother     Social History Social History   Tobacco Use  . Smoking status: Former Smoker    Packs/day: 0.25    Years: 10.00    Pack years: 2.50    Types: Cigarettes    Last attempt to quit: 11/08/2017    Years since quitting: 0.6  . Smokeless tobacco: Never Used  Substance Use Topics  . Alcohol use: Never    Frequency: Never  . Drug use: Never     Allergies   Benadryl  [diphenhydramine] and Reglan [metoclopramide]   Review of Systems Review of Systems  Constitutional: Negative for chills and fever.  HENT: Negative for sore throat and trouble swallowing.   Eyes: Positive for visual disturbance.  Respiratory: Negative for cough and shortness of breath.   Cardiovascular: Negative for chest pain.  Gastrointestinal: Positive for anal bleeding, constipation, nausea and vomiting. Negative for abdominal pain and diarrhea.  Genitourinary: Negative for dysuria, flank pain and frequency.  Musculoskeletal: Negative for back pain, myalgias and neck pain.  Skin: Negative for rash and wound.  Neurological: Positive for dizziness. Negative for syncope, weakness, light-headedness and numbness.  Psychiatric/Behavioral: The patient is nervous/anxious.   All other systems reviewed and are negative.    Physical Exam Updated Vital Signs BP 116/80   Pulse 85   Temp 97.8 F (36.6 C) (Oral)   Resp 16   Ht 5\' 2"  (1.575 m)   Wt 83 kg   SpO2 95%   BMI 33.47 kg/m   Physical Exam Vitals signs and nursing note reviewed.  Constitutional:      Appearance: She is well-developed.     Comments: Very anxious appearing  HENT:     Head: Normocephalic and atraumatic.     Mouth/Throat:     Mouth: Mucous membranes are moist.  Eyes:     Pupils: Pupils are equal, round, and reactive to light.     Comments: Fatigable rotatory nystagmus  Neck:     Musculoskeletal: Normal range of motion and neck supple. No neck rigidity or muscular tenderness.  Cardiovascular:     Rate and Rhythm: Normal rate and regular rhythm.     Heart sounds: No murmur. No friction rub. No gallop.   Pulmonary:     Effort: Pulmonary effort is normal. No respiratory distress.     Breath sounds: Normal breath sounds. No stridor. No wheezing, rhonchi or rales.  Abdominal:     General: Bowel sounds are normal. There is no distension.     Palpations: Abdomen is soft.     Tenderness: There is no abdominal  tenderness. There is no right CVA tenderness, left CVA tenderness, guarding or rebound.  Musculoskeletal: Normal range of motion.        General: No swelling, tenderness, deformity or signs of injury.     Right lower leg: No edema.     Left lower leg: No edema.  Lymphadenopathy:     Cervical: No cervical adenopathy.  Skin:    General: Skin is warm and dry.     Findings: No erythema or rash.  Neurological:     General: No focal deficit present.     Mental Status: She is alert and oriented to person, place, and time.     Comments: Patient is alert and oriented x3 with clear, goal oriented speech. Patient  has 5/5 motor in all extremities. Sensation is intact to light touch. Bilateral finger-to-nose is normal with no signs of dysmetria.  Psychiatric:        Behavior: Behavior normal.      ED Treatments / Results  Labs (all labs ordered are listed, but only abnormal results are displayed) Labs Reviewed  CBC WITH DIFFERENTIAL/PLATELET - Abnormal; Notable for the following components:      Result Value   Platelets 457 (*)    All other components within normal limits  COMPREHENSIVE METABOLIC PANEL - Abnormal; Notable for the following components:   Potassium 2.9 (*)    CO2 18 (*)    Glucose, Bld 179 (*)    Creatinine, Ser 1.07 (*)    Anion gap 18 (*)    All other components within normal limits  URINALYSIS, ROUTINE W REFLEX MICROSCOPIC - Abnormal; Notable for the following components:   Hgb urine dipstick MODERATE (*)    Ketones, ur 20 (*)    Leukocytes,Ua SMALL (*)    All other components within normal limits  LIPASE, BLOOD  I-STAT BETA HCG BLOOD, ED ([facility], [facility], [facility] ONLY)    EKG EKG Interpretation  Date/Time:  Saturday Jul 02 2018 10:43:27 EDT Ventricular Rate:  90 PR Interval:    QRS Duration: 83 QT Interval:  400 QTC Calculation: 490 R Axis:   19 Text Interpretation:  Sinus rhythm Borderline T wave abnormalities Borderline prolonged QT interval Confirmed by Loren Racer ([id number]) on 07/02/2018 10:47:02 AM Also confirmed by Loren Racer ([id number]), editor Barbette Hair [phone])  on 07/02/2018 12:15:20 PM   Radiology No results found.  Procedures Procedures (including critical care time)  Medications Ordered in ED Medications  ondansetron (ZOFRAN) injection 4 mg (4 mg Intravenous Given 07/02/18 1042)  LORazepam (ATIVAN) injection 0.5 mg (0.5 mg Intravenous Given 07/02/18 1042)  sodium chloride 0.9 % bolus 1,000 mL (0 mLs Intravenous Stopped 07/02/18 1145)  potassium chloride 10 mEq in 100 mL IVPB (0 mEq Intravenous Stopped 07/02/18 1357)  0.9 %  sodium chloride infusion ( Intravenous Stopped 07/02/18 1357)  meclizine (ANTIVERT) tablet 25 mg (25 mg Oral Given 07/02/18 1303)     Initial Impression / Assessment and Plan / ED Course  I have reviewed the triage vital signs and the nursing notes.  Pertinent labs & imaging results that were available during my care of the patient were reviewed by me and considered in my medical decision making (see chart for details).       Dizziness and nausea have improved significantly.  Patient with exam consistent with peripheral vertigo.  No evidence of central cause or need for imaging at this point.  Patient does have hypokalemia and has been given potassium replacement in the emergency department.   Final Clinical Impressions(s) / ED Diagnoses   Final diagnoses:  Peripheral vertigo, unspecified laterality  Hypokalemia  Dehydration    ED Discharge Orders         Ordered    potassium chloride SA (K-DUR) 20 MEQ tablet  Daily     07/02/18 1357    meclizine (ANTIVERT) 25 MG tablet  3 times daily PRN     07/02/18 1357    ondansetron (ZOFRAN) 4 MG tablet  Every 6 hours     07/02/18 1357           Loren Racer, MD 07/02/18 1411

## 2018-07-02 NOTE — ED Triage Notes (Signed)
Patient is AOx4 and ambulatory. Patient arrived via POV. Patient chief complaint is sudden onset of dizziness. Patient stated she has blood in stool as well and has consult on phone with GI Doctor later this week regarding GI issues.

## 2018-07-08 ENCOUNTER — Other Ambulatory Visit: Payer: Self-pay

## 2018-07-08 ENCOUNTER — Encounter: Payer: Self-pay | Admitting: Family Medicine

## 2018-07-08 ENCOUNTER — Ambulatory Visit (INDEPENDENT_AMBULATORY_CARE_PROVIDER_SITE_OTHER): Payer: No Typology Code available for payment source | Admitting: Family Medicine

## 2018-07-08 VITALS — BP 98/86 | HR 134 | Temp 98.4°F | Resp 18 | Ht 62.4 in | Wt 195.2 lb

## 2018-07-08 DIAGNOSIS — R000 Tachycardia, unspecified: Secondary | ICD-10-CM

## 2018-07-08 DIAGNOSIS — I10 Essential (primary) hypertension: Secondary | ICD-10-CM

## 2018-07-08 DIAGNOSIS — R42 Dizziness and giddiness: Secondary | ICD-10-CM | POA: Diagnosis not present

## 2018-07-08 DIAGNOSIS — H9203 Otalgia, bilateral: Secondary | ICD-10-CM | POA: Diagnosis not present

## 2018-07-08 DIAGNOSIS — F411 Generalized anxiety disorder: Secondary | ICD-10-CM | POA: Diagnosis not present

## 2018-07-08 LAB — POCT CBC
Granulocyte percent: 69.3 %G (ref 37–80)
HCT, POC: 47.2 % — AB (ref 29–41)
Hemoglobin: 16.1 g/dL — AB (ref 11–14.6)
Lymph, poc: 2.1 (ref 0.6–3.4)
MCH, POC: 31.7 pg — AB (ref 27–31.2)
MCHC: 34.1 g/dL (ref 31.8–35.4)
MCV: 92.9 fL (ref 76–111)
MID (cbc): 0.7 (ref 0–0.9)
MPV: 7.5 fL (ref 0–99.8)
POC Granulocyte: 6.4 (ref 2–6.9)
POC LYMPH PERCENT: 23.3 %L (ref 10–50)
POC MID %: 7.4 %M (ref 0–12)
Platelet Count, POC: 442 10*3/uL — AB (ref 142–424)
RBC: 5.08 M/uL (ref 4.04–5.48)
RDW, POC: 12.8 %
WBC: 9.2 10*3/uL (ref 4.6–10.2)

## 2018-07-08 LAB — GLUCOSE, POCT (MANUAL RESULT ENTRY): POC Glucose: 103 mg/dl — AB (ref 70–99)

## 2018-07-08 NOTE — Patient Instructions (Addendum)
Talk to psychiatrist about medications for anxiety, but I do recommend meeting with therapist. Here is another number if needed. Call psychiatrist about med refill if you can not find the prescription.   [facility]: [phone]  decerease lisinopril to 20mg  per day for now. Keep a record of your blood pressures outside of the office and if over 140/90 -can restart second dose.   I'm glad to hear that overall your symptoms are improving.  As we discussed there may be multiple causes of dizziness, including vertigo so okay to use the meclizine.  I will refer you to cardiology to evaluate for other causes of dizziness and fast heart rate.  I did check some other blood work including your potassium and thyroid test, but blood counts in the office and blood sugar overall looked okay.  EKG in the office also looked overall okay.  I will have you follow-up with Dr. Leretha Pol in the next 1 to 2 weeks, but happy to see you sooner if any new or worsening symptoms.  I do not appreciate a fever in the office, would recommend checking your temperature at home with different thermometer if possible.  If you do have a fever, please call for appointment/evaluation.  Return to the clinic or go to the nearest emergency room if any of your symptoms worsen or new symptoms occur.  Dizziness Dizziness is a common problem. It is a feeling of unsteadiness or light-headedness. You may feel like you are about to faint. Dizziness can lead to injury if you stumble or fall. Anyone can become dizzy, but dizziness is more common in older adults. This condition can be caused by a number of things, including medicines, dehydration, or illness. Follow these instructions at home: Eating and drinking  Drink enough fluid to keep your urine clear or pale yellow. This helps to keep you from becoming dehydrated. Try to drink more clear fluids, such as water.  Do not drink alcohol.  Limit your caffeine intake if  told to do so by your health care provider. Check ingredients and nutrition facts to see if a food or beverage contains caffeine.  Limit your salt (sodium) intake if told to do so by your health care provider. Check ingredients and nutrition facts to see if a food or beverage contains sodium. Activity  Avoid making quick movements. ? Rise slowly from chairs and steady yourself until you feel okay. ? In the morning, first sit up on the side of the bed. When you feel okay, stand slowly while you hold onto something until you know that your balance is fine.  If you need to stand in one place for a long time, move your legs often. Tighten and relax the muscles in your legs while you are standing.  Do not drive or use heavy machinery if you feel dizzy.  Avoid bending down if you feel dizzy. Place items in your home so that they are easy for you to reach without leaning over. Lifestyle  Do not use any products that contain nicotine or tobacco, such as cigarettes and e-cigarettes. If you need help quitting, ask your health care provider.  Try to reduce your stress level by using methods such as yoga or meditation. Talk with your health care provider if you need help to manage your stress. General instructions  Watch your dizziness for any changes.  Take over-the-counter and prescription medicines only as told by your health care provider. Talk with your health care provider if you think  that your dizziness is caused by a medicine that you are taking.  Tell a friend or a family member that you are feeling dizzy. If he or she notices any changes in your behavior, have this person call your health care provider.  Keep all follow-up visits as told by your health care provider. This is important. Contact a health care provider if:  Your dizziness does not go away.  Your dizziness or light-headedness gets worse.  You feel nauseous.  You have reduced hearing.  You have new symptoms.  You  are unsteady on your feet or you feel like the room is spinning. Get help right away if:  You vomit or have diarrhea and are unable to eat or drink anything.  You have problems talking, walking, swallowing, or using your arms, hands, or legs.  You feel generally weak.  You are not thinking clearly or you have trouble forming sentences. It may take a friend or family member to notice this.  You have chest pain, abdominal pain, shortness of breath, or sweating.  Your vision changes.  You have any bleeding.  You have a severe headache.  You have neck pain or a stiff neck.  You have a fever. These symptoms may represent a serious problem that is an emergency. Do not wait to see if the symptoms will go away. Get medical help right away. Call your local emergency services (911 in the [place] Do not drive yourself to the hospital. Summary  Dizziness is a feeling of unsteadiness or light-headedness. This condition can be caused by a number of things, including medicines, dehydration, or illness.  Anyone can become dizzy, but dizziness is more common in older adults.  Drink enough fluid to keep your urine clear or pale yellow. Do not drink alcohol.  Avoid making quick movements if you feel dizzy. Monitor your dizziness for any changes. This information is not intended to replace advice given to you by your health care provider. Make sure you discuss any questions you have with your health care provider. Document Released: 07/29/2000 Document Revised: 03/07/2016 Document Reviewed: 03/07/2016 Elsevier Interactive Patient Education  [employer]2019 [employer]        If you have lab work done today you will be contacted with your lab results within the next 2 weeks.  If you have not heard from us then please contact us. The fastest way to get your results is to register for My Chart.   IF you received an x-ray today, you will receive an invoice from [facility]. Please contact  [facility] at [phone] with questions or concerns regarding your invoice.   IF you received labwork today, you will receive an invoice from [facility]. Please contact [facility] at [phone] with questions or concerns regarding your invoice.   Our billing staff will not be able to assist you with questions regarding bills from these companies.  You will be contacted with the lab results as soon as they are available. The fastest way to get your results is to activate your My Chart account. Instructions are located on the last page of this paperwork. If you have not heard from us regarding the results in 2 weeks, please contact this office.

## 2018-07-08 NOTE — Progress Notes (Signed)
Subjective:    Patient ID: Jennifer Salazar, female    DOB: 09-Feb-1981, 38 y.o.   MRN: [id number]  HPI Jennifer Salazar is a 38 y.o. female Presents today for: Chief Complaint  Patient presents with  . Dizziness    X 6 days off and on   . Ear Pain    X 5 days both ears   Symptoms as above starting approximately 6 days ago with dizziness.   She was evaluated through [facility] on May 16.  At that time was having some dizziness with sensation of feeling off balance.  Difficulty focusing station neurology.  Associated nausea vomiting but no hearing changes.  No focal weakness or numbness.  Recent hemorrhoid bleeding but no abdominal pain.  Was feeling anxious at that time.  Reports straining for BM that am, small blood from hemorrhoid. Stood up and everything was spinning. Double vision, ringing in ears. Unsure if any change in hearing - panicked. Sweating. Went to ER. No no slurred speech, no focal weakness. Double vision resolved in ER after taking sedative and zofran. Spinning sensation occurred with standing again.   Overall feeling better. Less dizzy spells. Meclizine BID. Some ringing in ears (there in the past as well). No recent changes. No changes in hearing or vision changes.  No weakness, no chest pains or heart palpitations. Temp 100.1 this am on home thermometer. Does not feel feverish. Normal temp here today.  Some slight lightheadedness - improves with sitting. No change in tinnitus.  No hearing loss. Ears feel a little pressurized, min fleeting discomfort.   Notes, testing reviewed from the emergency room.  Normal hemoglobin, WBC normal, potassium mildly low at 2.9(20 mEq of potassium given through IV), creatinine 1.07, up from 0.70 previously.  Normal lipase, negative hCG, EKG was sinus rhythm, borderline QTC at 490.  Treated with Zofran, lorazepam, IV fluids, prescription for meclizine for peripheral vertigo.  In past - had to adjust BP meds as would feel lightheaded  with standing few months ago. Stopped norvasc, and fluid pill months ago. Only on lisinopril 20mg  BID. Same BP med for awhile.   Has been trying to drink more water. UOP, variable. Sometimes often - hx of interstitial cystitis.    Has been followed by PCP for constipation and diarrhea.  Has been treated by psychiatry for more anxiety - on zoloft for 3-4 months. Some increased anxiety with Covid 19. Increased dose of Zoloft 2 months ago "panic attacks wearing mask". Taking xanax 1mg   - 3 times per day recently. Ran out - last took last night. Missing about a week's worth- looking for other supply at home. Took a few extra half pill here and there- not a week's worth. Not due for refill until June 2nd.  Father passed away 1 month ago. Lived in [place] - had only seen him 2 times in past 6 years.  Psychiatrist only - appt June 1st. Plans on discussing therapist at that visit.  Denies alcohol or IDU.  Controlled substance database (PDMP) reviewed. Last filled #60 on 06/13/18.   10min chart review. Initial history over 25min.   Dep[facility] screen [facility] 2/9 07/08/2018 04/29/2018 03/28/2018 03/22/2018 12/08/2017  Decreased Interest 1 1 0 1 0  Down, Depressed, Hopeless 2 0 0 1 0  PHQ - 2 Score 3 1 0 2 0  Altered sleeping 2 1 - 2 -  Tired, decreased energy 2 2 - 2 -  Change in appetite 3 2 - 1 -  Feeling  bad or failure about yourself  0 1 - 1 -  Trouble concentrating 1 0 - 0 -  Moving slowly or fidgety/restless 1 1 - 0 -  Suicidal thoughts 0 0 - 0 -  PHQ-9 Score 12 8 - 8 -  Difficult doing work/chores Somewhat difficult Somewhat difficult - Somewhat difficult -     Patient Active Problem List   Diagnosis Date Noted  . Panic disorder 04/26/2018  . Generalized anxiety disorder 03/26/2018  . Bladder problem 11/06/2016  . Hypertensive disorder 11/06/2016  . Gastroesophageal reflux disease without esophagitis 11/06/2016   Past Medical History:  Diagnosis Date  . Anxiety   . Hypertension     Past Surgical History:  Procedure Laterality Date  . WISDOM TOOTH EXTRACTION     Allergies  Allergen Reactions  . Benadryl [Diphenhydramine] Other (See Comments)    Internal restlessness  . Reglan [Metoclopramide]    Prior to Admission medications   Medication Sig Start Date End Date Taking? Authorizing Provider  ALPRAZolam Prudy Feeler) 1 MG tablet  07/07/18  Yes [provider]  butalbital-acetaminophen-caffeine (FIORICET, ESGIC) 50-325-40 MG tablet Take 1 tablet by mouth as needed for migraine. 08/24/17  Yes [provider]  levonorgestrel (MIRENA) 20 MCG/24HR IUD by Intrauterine route.   Yes [provider]  lisinopril (PRINIVIL,ZESTRIL) 20 MG tablet Take 1 tablet (20 mg total) by mouth 2 (two) times daily. 03/22/18  Yes Myles Lipps, MD  meclizine (ANTIVERT) 25 MG tablet Take 1 tablet (25 mg total) by mouth 3 (three) times daily as needed for dizziness. 07/02/18  Yes Loren Racer, MD  ondansetron (ZOFRAN) 4 MG tablet Take 1 tablet (4 mg total) by mouth every 6 (six) hours. 07/02/18  Yes Loren Racer, MD  potassium chloride SA (K-DUR) 20 MEQ tablet Take 1 tablet (20 mEq total) by mouth daily. 07/03/18  Yes Loren Racer, MD  sertraline (ZOLOFT) 50 MG tablet Take 1 tablet (50 mg total) by mouth daily. 03/26/18 03/26/19 Yes Myrlene Broker, MD  triamcinolone cream (KENALOG) 0.1 % Apply 1 application topically 2 (two) times daily.  01/07/18  Yes [provider]  amLODipine (NORVASC) 5 MG tablet Take 1 tablet (5 mg total) by mouth daily. Patient not taking: Reported on 07/02/2018 03/28/18   Myles Lipps, MD  montelukast (SINGULAIR) 10 MG tablet Take 1 tablet (10 mg total) by mouth at bedtime. Patient not taking: Reported on 07/02/2018 03/22/18   Myles Lipps, MD  pantoprazole (PROTONIX) 40 MG tablet Take 1 tablet (40 mg total) by mouth daily. Patient not taking: Reported on 07/02/2018 06/16/18   Tressia Danas, MD  sucralfate (CARAFATE) 1 g tablet TAKE  1 TABLET BY MOUTH FOUR TIMES DAILY WITH MEALS AND AT BEDTIME FOR 14 DAYS Patient not taking: Reported on 07/02/2018 06/17/18   Tressia Danas, MD   Social History   Socioeconomic History  . Marital status: Married    Spouse name: Otilio Saber  . Number of children: 0  . Years of education: Not on file  . Highest education level: Some college, no degree  Occupational History  . Not on file  Social Needs  . Financial resource strain: Not hard at all  . Food insecurity:    Worry: Never true    Inability: Never true  . Transportation needs:    Medical: No    Non-medical: No  Tobacco Use  . Smoking status: Former Smoker    Packs/day: 0.25    Years: 10.00    Pack  years: 2.50    Types: Cigarettes    Last attempt to quit: 11/08/2017    Years since quitting: 0.6  . Smokeless tobacco: Never Used  Substance and Sexual Activity  . Alcohol use: Not Currently    Frequency: Never  . Drug use: Never  . Sexual activity: Yes    Birth control/protection: I.U.D.  Lifestyle  . Physical activity:    Days per week: 0 days    Minutes per session: 0 min  . Stress: Very much  Relationships  . Social connections:    Talks on phone: Not on file    Gets together: Not on file    Attends religious service: Never    Active member of club or organization: No    Attends meetings of clubs or organizations: Never    Relationship status: Married  . Intimate partner violence:    Fear of current or ex partner: No    Emotionally abused: No    Physically abused: No    Forced sexual activity: No  Other Topics Concern  . Not on file  Social History Narrative  . Not on file    Review of Systems Per HPI.     Objective:   Physical Exam Vitals signs reviewed.  Constitutional:      Appearance: She is well-developed.     Comments: Anxious at times.   HENT:     Head: Normocephalic and atraumatic.  Eyes:     General: Lids are normal.     Extraocular Movements:     Right eye: Normal extraocular  motion and no nystagmus.     Left eye: Normal extraocular motion and no nystagmus.     Conjunctiva/sclera: Conjunctivae normal.     Pupils: Pupils are equal, round, and reactive to light.  Neck:     Vascular: No carotid bruit.  Cardiovascular:     Rate and Rhythm: Regular rhythm. Tachycardia present.     Heart sounds: Normal heart sounds. No murmur.  Pulmonary:     Effort: Pulmonary effort is normal.     Breath sounds: Normal breath sounds.  Abdominal:     Palpations: Abdomen is soft. There is no pulsatile mass.     Tenderness: There is no abdominal tenderness.  Skin:    General: Skin is warm and dry.  Neurological:     General: No focal deficit present.     Mental Status: She is alert and oriented to person, place, and time.     GCS: GCS eye subscore is 4. GCS verbal subscore is 5. GCS motor subscore is 6.     Cranial Nerves: Cranial nerves are intact.     Sensory: Sensation is intact.     Motor: Motor function is intact. No weakness, tremor or pronator drift.     Coordination: Coordination is intact. Coordination normal. Finger-Nose-Finger Test and Heel to [facility] Test normal. Rapid alternating movements normal.     Gait: Gait is intact. Gait normal.  Psychiatric:        Mood and Affect: Mood is anxious.        Behavior: Behavior normal.        Thought Content: Thought content normal. Thought content does not include suicidal ideation.    Vitals:   07/08/18 1127 07/08/18 1150  BP: 106/82 98/86  Pulse: (!) 129 (!) 134  Resp: 18   Temp: 98.4 F (36.9 C)   TempSrc: Oral   SpO2: 96%   Weight: 195 lb 3.2 oz (88.5 kg)  Height: 5' 2.4" (1.585 m)     Results for orders placed or performed in visit on 07/08/18  Basic metabolic panel  Result Value Ref Range   Glucose 113 (H) 65 - 99 mg/dL   BUN 12 6 - 20 mg/dL   Creatinine, Ser 1.02 (H) 0.57 - 1.00 mg/dL   GFR calc non Af Amer 70 >59 mL/min/1.73   GFR calc Af Amer 81 >59 mL/min/1.73   BUN/Creatinine Ratio 12 9 - 23    Sodium 138 134 - 144 mmol/L   Potassium 4.5 3.5 - 5.2 mmol/L   Chloride 100 96 - 106 mmol/L   CO2 17 (L) 20 - 29 mmol/L   Calcium 9.4 8.7 - 10.2 mg/dL  TSH  Result Value Ref Range   TSH 1.700 0.450 - 4.500 uIU/mL  POCT glucose (manual entry)  Result Value Ref Range   POC Glucose 103 (A) 70 - 99 mg/dl  POCT CBC  Result Value Ref Range   WBC 9.2 4.6 - 10.2 K/uL   Lymph, poc 2.1 0.6 - 3.4   POC LYMPH PERCENT 23.3 10 - 50 %L   MID (cbc) 0.7 0 - 0.9   POC MID % 7.4 0 - 12 %M   POC Granulocyte 6.4 2 - 6.9   Granulocyte percent 69.3 37 - 80 %G   RBC 5.08 4.04 - 5.48 M/uL   Hemoglobin 16.1 (A) 11 - 14.6 g/dL   HCT, POC 72.5 (A) 29 - 41 %   MCV 92.9 76 - 111 fL   MCH, POC 31.7 (A) 27 - 31.2 pg   MCHC 34.1 31.8 - 35.4 g/dL   RDW, POC 36.6 %   Platelet Count, POC 442 (A) 142 - 424 K/uL   MPV 7.5 0 - 99.8 fL     Orthostatic VS for the past 24 hrs:  BP- Lying Pulse- Lying BP- Sitting Pulse- Sitting  07/08/18 1136 123/87 115 115/85 138   EKG: Sinus tachycardia, rate 120.  QTc 432, PR 154.  Flat T in lead I no apparent ST/T wave changes. Over 40 min total face to face care, greater than 50% counseling/history.     Assessment & Plan:  Jennifer Salazar is a 38 y.o. female Dizziness - Plan: POCT glucose (manual entry), POCT CBC, EKG 12-Lead, Basic metabolic panel, Ambulatory referral to Cardiology  Otalgia of both ears  Generalized anxiety disorder  Sinus tachycardia - Plan: TSH, Ambulatory referral to Cardiology  Essential hypertension - Plan: Ambulatory referral to Cardiology  Vertigo  Possible multiple contributors to her dizziness, including vertigo, relative hypotension possible, and component of anxiety..  Does report overall improvement in symptoms.  Reassuring CBC, glucose, and no concerning findings on EKG.   -Sinus tachycardia noted, with symptoms noted with positional changes, differential includes postural orthostatic tachycardia syndrome.  Did refer to cardiology,  check TSH for tachycardia.   -Repeat potassium level, other electrolytes.  -Decrease lisinopril to 20 mg daily temporarily, monitor blood pressures closely and if significant elevations restart prior dosing.  -Counseling recommended, phone number provided.  Recommended she follow-up with psychiatrist same day about her medications for anxiety, including benzodiazepine refill, given persistent use.  Advised to not stop abruptly.  -Follow-up with primary care provider in the next 1 to 2 weeks, ER/911 precautions discussed if acute worsening sooner.  No orders of the defined types were placed in this encounter.  Patient Instructions    Talk to psychiatrist about medications for anxiety, but I do recommend meeting with therapist.  Here is another number if needed. Call psychiatrist about med refill if you can not find the prescription.   [facility]: [phone]  decerease lisinopril to  per day for now. Keep a record of your blood pressures outside of the office and if over 140/90 -can restart second dose.   I'm glad to hear that overall your symptoms are improving.  As we discussed there may be multiple causes of dizziness, including vertigo so okay to use the meclizine.  I will refer you to cardiology to evaluate for other causes of dizziness and fast heart rate.  I did check some other blood work including your potassium and thyroid test, but blood counts in the office and blood sugar overall looked okay.  EKG in the office also looked overall okay.  I will have you follow-up with Dr. Leretha Pol in the next 1 to 2 weeks, but happy to see you sooner if any new or worsening symptoms.  I do not appreciate a fever in the office, would recommend checking your temperature at home with different thermometer if possible.  If you do have a fever, please call for appointment/evaluation.  Return to the clinic or go to the nearest emergency room if any of your symptoms worsen or new  symptoms occur.  Dizziness Dizziness is a common problem. It is a feeling of unsteadiness or light-headedness. You may feel like you are about to faint. Dizziness can lead to injury if you stumble or fall. Anyone can become dizzy, but dizziness is more common in older adults. This condition can be caused by a number of things, including medicines, dehydration, or illness. Follow these instructions at home: Eating and drinking  Drink enough fluid to keep your urine clear or pale yellow. This helps to keep you from becoming dehydrated. Try to drink more clear fluids, such as water.  Do not drink alcohol.  Limit your caffeine intake if told to do so by your health care provider. Check ingredients and nutrition facts to see if a food or beverage contains caffeine.  Limit your salt (sodium) intake if told to do so by your health care provider. Check ingredients and nutrition facts to see if a food or beverage contains sodium. Activity  Avoid making quick movements. ? Rise slowly from chairs and steady yourself until you feel okay. ? In the morning, first sit up on the side of the bed. When you feel okay, stand slowly while you hold onto something until you know that your balance is fine.  If you need to stand in one place for a long time, move your legs often. Tighten and relax the muscles in your legs while you are standing.  Do not drive or use heavy machinery if you feel dizzy.  Avoid bending down if you feel dizzy. Place items in your home so that they are easy for you to reach without leaning over. Lifestyle  Do not use any products that contain nicotine or tobacco, such as cigarettes and e-cigarettes. If you need help quitting, ask your health care provider.  Try to reduce your stress level by using methods such as yoga or meditation. Talk with your health care provider if you need help to manage your stress. General instructions  Watch your dizziness for any changes.  Take  over-the-counter and prescription medicines only as told by your health care provider. Talk with your health care provider if you think that your dizziness is caused by a medicine that you are taking.  Tell a friend or a family member that you are feeling dizzy. If he or she notices any changes in your behavior, have this person call your health care provider.  Keep all follow-up visits as told by your health care provider. This is important. Contact a health care provider if:  Your dizziness does not go away.  Your dizziness or light-headedness gets worse.  You feel nauseous.  You have reduced hearing.  You have new symptoms.  You are unsteady on your feet or you feel like the room is spinning. Get help right away if:  You vomit or have diarrhea and are unable to eat or drink anything.  You have problems talking, walking, swallowing, or using your arms, hands, or legs.  You feel generally weak.  You are not thinking clearly or you have trouble forming sentences. It may take a friend or family member to notice this.  You have chest pain, abdominal pain, shortness of breath, or sweating.  Your vision changes.  You have any bleeding.  You have a severe headache.  You have neck pain or a stiff neck.  You have a fever. These symptoms may represent a serious problem that is an emergency. Do not wait to see if the symptoms will go away. Get medical help right away. Call your local emergency services (911 in the [place] Do not drive yourself to the hospital. Summary  Dizziness is a feeling of unsteadiness or light-headedness. This condition can be caused by a number of things, including medicines, dehydration, or illness.  Anyone can become dizzy, but dizziness is more common in older adults.  Drink enough fluid to keep your urine clear or pale yellow. Do not drink alcohol.  Avoid making quick movements if you feel dizzy. Monitor your dizziness for any changes. This  information is not intended to replace advice given to you by your health care provider. Make sure you discuss any questions you have with your health care provider. Document Released: 07/29/2000 Document Revised: 03/07/2016 Document Reviewed: 03/07/2016 Elsevier Interactive Patient Education  [employer].        If you have lab work done today you will be contacted with your lab results within the next 2 weeks.  If you have not heard from [place] then please contact us. The fastest way to get your results is to register for My Chart.   IF you received an x-ray today, you will receive an invoice from [facility]. Please contact [facility] at [phone] with questions or concerns regarding your invoice.   IF you received labwork today, you will receive an invoice from [place]. Please contact [facility] at [phone] with questions or concerns regarding your invoice.   Our billing staff will not be able to assist you with questions regarding bills from these companies.  You will be contacted with the lab results as soon as they are available. The fastest way to get your results is to activate your My Chart account. Instructions are located on the last page of this paperwork. If you have not heard from [place] regarding the results in 2 weeks, please contact this office.      Signed,   Meredith Staggers, MD Primary [facility] at [facility].  07/14/18 3:30 PM

## 2018-07-09 LAB — BASIC METABOLIC PANEL
BUN/Creatinine Ratio: 12 (ref 9–23)
BUN: 12 mg/dL (ref 6–20)
CO2: 17 mmol/L — ABNORMAL LOW (ref 20–29)
Calcium: 9.4 mg/dL (ref 8.7–10.2)
Chloride: 100 mmol/L (ref 96–106)
Creatinine, Ser: 1.02 mg/dL — ABNORMAL HIGH (ref 0.57–1.00)
GFR calc Af Amer: 81 mL/min/{1.73_m2} (ref >59)
GFR calc non Af Amer: 70 mL/min/{1.73_m2} (ref >59)
Glucose: 113 mg/dL — ABNORMAL HIGH (ref 65–99)
Potassium: 4.5 mmol/L (ref 3.5–5.2)
Sodium: 138 mmol/L (ref 134–144)

## 2018-07-09 LAB — TSH: TSH: 1.7 u[IU]/mL (ref 0.450–4.500)

## 2018-07-12 ENCOUNTER — Other Ambulatory Visit (HOSPITAL_COMMUNITY): Payer: Self-pay

## 2018-07-12 MED ORDER — ALPRAZOLAM 1 MG PO TABS: 1.0000 mg | ORAL_TABLET | Freq: Three times a day (TID) | ORAL | 0 refills | Status: DC | PRN

## 2018-07-14 ENCOUNTER — Encounter: Payer: Self-pay | Admitting: Family Medicine

## 2018-07-14 ENCOUNTER — Telehealth (HOSPITAL_COMMUNITY): Payer: Self-pay | Admitting: Psychiatry

## 2018-07-18 ENCOUNTER — Ambulatory Visit (INDEPENDENT_AMBULATORY_CARE_PROVIDER_SITE_OTHER): Payer: No Typology Code available for payment source | Admitting: Psychiatry

## 2018-07-18 ENCOUNTER — Other Ambulatory Visit: Payer: Self-pay

## 2018-07-18 DIAGNOSIS — F410 Panic disorder [episodic paroxysmal anxiety] without agoraphobia: Secondary | ICD-10-CM

## 2018-07-18 DIAGNOSIS — F411 Generalized anxiety disorder: Secondary | ICD-10-CM | POA: Diagnosis not present

## 2018-07-18 MED ORDER — SERTRALINE HCL 100 MG PO TABS: 100.0000 mg | ORAL_TABLET | Freq: Every day | ORAL | 2 refills | Status: DC

## 2018-07-18 MED ORDER — ALPRAZOLAM 1 MG PO TABS: 1.0000 mg | ORAL_TABLET | Freq: Three times a day (TID) | ORAL | 0 refills | Status: DC | PRN

## 2018-07-18 NOTE — Progress Notes (Signed)
BH MD/PA/NP OP Progress Note  07/18/2018 10:15 AM Jennifer Salazar  MRN:  [id number] Interview was conducted by phone and I verified that I was speaking with the correct person using two identifiers. I discussed the limitations of evaluation and management by telemedicine and  the availability of in person appointments. Patient expressed understanding and agreed to proceed. Chief Complaint: Anxiety. HPI: 38 yo married female with recurrence of generalized and panic type anxiety after recent move to [place] and starting a new job. She still has episodic panic attacks and excessive worrying (about multiple physical ailments/complaints - vertigo, headaches, heartburn). No depression, no SI. Sleep improves when she take alprazolam. Tolerates Zoloft 75 mg well but remains anxious and we had to increase alprazolam to 1 mg tid per panic attacks which have intensified since stay home order because of corona virus was issued (she works at [employer]). Her father in [place] passed away in August 01, 2022 and she traveled there. Interestingly enough she feels that that trip made her feel less anxious once she returned but is reluctant to have alprazolam dose decreased at this time.  Visit Diagnosis:    ICD-10-CM   1. Generalized anxiety disorder F41.1   2. Panic disorder F41.0     Past Psychiatric History: Please refer to intake H&P  Past Medical History:  Past Medical History:  Diagnosis Date  . Anxiety   . Hypertension     Past Surgical History:  Procedure Laterality Date  . WISDOM TOOTH EXTRACTION      Family Psychiatric History: Reviewed  Family History:  Family History  Problem Relation Age of Onset  . Hypertension Mother   . COPD Father   . Crohn's disease Father   . Anxiety disorder Father   . Drug abuse Sister   . Anxiety disorder Brother     Social History:  Social History   Socioeconomic History  . Marital status: Married    Spouse name: Otilio Saber  . Number of children: 0  . Years  of education: Not on file  . Highest education level: Some college, no degree  Occupational History  . Not on file  Social Needs  . Financial resource strain: Not hard at all  . Food insecurity:    Worry: Never true    Inability: Never true  . Transportation needs:    Medical: No    Non-medical: No  Tobacco Use  . Smoking status: Former Smoker    Packs/day: 0.25    Years: 10.00    Pack years: 2.50    Types: Cigarettes    Last attempt to quit: 11/08/2017    Years since quitting: 0.6  . Smokeless tobacco: Never Used  Substance and Sexual Activity  . Alcohol use: Not Currently    Frequency: Never  . Drug use: Never  . Sexual activity: Yes    Birth control/protection: I.U.D.  Lifestyle  . Physical activity:    Days per week: 0 days    Minutes per session: 0 min  . Stress: Very much  Relationships  . Social connections:    Talks on phone: Not on file    Gets together: Not on file    Attends religious service: Never    Active member of club or organization: No    Attends meetings of clubs or organizations: Never    Relationship status: Married  Other Topics Concern  . Not on file  Social History Narrative  . Not on file    Allergies:  Allergies  Allergen  Reactions  . Benadryl [Diphenhydramine] Other (See Comments)    Internal restlessness  . Reglan [Metoclopramide]     Metabolic Disorder Labs: No results found for: HGBA1C, MPG No results found for: PROLACTIN Lab Results  Component Value Date   CHOL 191 03/22/2018   TRIG 92 03/22/2018   HDL 46 03/22/2018   CHOLHDL 4.2 03/22/2018   LDLCALC 127 (H) 03/22/2018   Lab Results  Component Value Date   TSH 1.700 07/08/2018   TSH 2.730 03/22/2018    Therapeutic Level Labs: No results found for: LITHIUM No results found for: VALPROATE No components found for:  CBMZ  Current Medications: Current Outpatient Medications  Medication Sig Dispense Refill  . ALPRAZolam (XANAX) 1 MG tablet Take 1 tablet (1 mg  total) by mouth 3 (three) times daily as needed for anxiety. 60 tablet 0  . butalbital-acetaminophen-caffeine (FIORICET, ESGIC) 50-325-40 MG tablet Take 1 tablet by mouth as needed for migraine.    Marland Kitchen. levonorgestrel (MIRENA) 20 MCG/24HR IUD by Intrauterine route.    Marland Kitchen. lisinopril (PRINIVIL,ZESTRIL) 20 MG tablet Take 1 tablet (20 mg total) by mouth 2 (two) times daily. 180 tablet 1  . meclizine (ANTIVERT) 25 MG tablet Take 1 tablet (25 mg total) by mouth 3 (three) times daily as needed for dizziness. 30 tablet 0  . ondansetron (ZOFRAN) 4 MG tablet Take 1 tablet (4 mg total) by mouth every 6 (six) hours. 12 tablet 0  . pantoprazole (PROTONIX) 40 MG tablet Take 1 tablet (40 mg total) by mouth daily. (Patient not taking: Reported on 07/02/2018) 90 tablet 3  . potassium chloride SA (K-DUR) 20 MEQ tablet Take 1 tablet (20 mEq total) by mouth daily. 3 tablet 0  . sertraline (ZOLOFT) 100 MG tablet Take 1 tablet (100 mg total) by mouth daily. 30 tablet 2  . sucralfate (CARAFATE) 1 g tablet TAKE 1 TABLET BY MOUTH FOUR TIMES DAILY WITH MEALS AND AT BEDTIME FOR 14 DAYS (Patient not taking: Reported on 07/02/2018) 360 tablet 1  . triamcinolone cream (KENALOG) 0.1 % Apply 1 application topically 2 (two) times daily.      No current facility-administered medications for this visit.      Psychiatric Specialty Exam: Review of Systems  Gastrointestinal: Positive for heartburn.  Neurological: Positive for dizziness.  Psychiatric/Behavioral: The patient is nervous/anxious.   All other systems reviewed and are negative.   There were no vitals taken for this visit.There is no height or weight on file to calculate BMI.  General Appearance: NA  Eye Contact:  NA  Speech:  Clear and Coherent  Volume:  Normal  Mood:  Anxious  Affect:  NA  Thought Process:  Goal Directed  Orientation:  Full (Time, Place, and Person)  Thought Content: Logical   Suicidal Thoughts:  No  Homicidal Thoughts:  No  Memory:  Immediate;    Good Recent;   Good Remote;   Good  Judgement:  Good  Insight:  Good  Psychomotor Activity:  NA  Concentration:  Concentration: Good  Recall:  Good  Fund of Knowledge: Good  Language: Good  Akathisia:  NA  Handed:  Right  AIMS (if indicated): not done  Assets:  Communication Skills Desire for Improvement Financial Resources/Insurance Housing Intimacy Resilience Vocational/Educational  ADL's:  Intact  Cognition: WNL  Sleep:  Good   Screenings: GAD-7     Office Visit from 07/08/2018 in Primary Care at [facility] Office Visit from 04/29/2018 [facility] at [facility] Office Visit from 03/28/2018 in Primary Care  at [facility] Visit from 03/22/2018 in Primary Care at [facility]  Total GAD-7 Score  11  7  11  11     PHQ2-9     Office Visit from 07/08/2018 in Primary Care at [facility] Visit from 04/29/2018 in Primary Care at [facility] Visit from 03/28/2018 in Primary Care at [facility] Visit from 03/22/2018 in Primary Care at [facility] Visit from 12/08/2017 in Primary Care at [facility] Total Score  3  1  0  2  0  PHQ-9 Total Score  12  8  -  8  -       Assessment and Plan: 38 yo married female with recurrence of generalized and panic type anxiety after recent move to [place] and starting a new job. She still has episodic panic attacks and excessive worrying (about multiple physical ailments/complaints - vertigo, headaches, heartburn). No depression, no SI. Sleep improves when she take alprazolam. Tolerates Zoloft 75 mg well but remains anxious and we had to increase alprazolam to 1 mg tid per panic attacks which have intensified since stay home order because of corona virus was issued (she works at [employer]). Her father in [place] passed away in 07-17-22 and she traveled there. Interestingly enough she feels that that trip made her feel less anxious once she returned but is reluctant to have alprazolam dose decreased at this time.  Plan: Continue alprazolam 1 mg tid pen anxiety/sleep;  we will try to decrease it to 0.5 mg in one month. Increase dose of Zoloft to 100 mg daily. Next visit in one month.     Magdalene Patricia, MD 07/18/2018, 10:15 AM

## 2018-07-21 ENCOUNTER — Telehealth: Payer: No Typology Code available for payment source

## 2018-07-21 ENCOUNTER — Ambulatory Visit (INDEPENDENT_AMBULATORY_CARE_PROVIDER_SITE_OTHER)
Admission: RE | Admit: 2018-07-21 | Discharge: 2018-07-21 | Disposition: A | Discharge status: Home / Self Care | Payer: No Typology Code available for payment source | Source: Ambulatory Visit

## 2018-07-21 DIAGNOSIS — R339 Retention of urine, unspecified: Secondary | ICD-10-CM

## 2018-07-21 DIAGNOSIS — M545 Low back pain, unspecified: Secondary | ICD-10-CM

## 2018-07-21 DIAGNOSIS — R509 Fever, unspecified: Secondary | ICD-10-CM

## 2018-07-21 MED ORDER — FLUCONAZOLE 150 MG PO TABS: 150.0000 mg | ORAL_TABLET | Freq: Every day | ORAL | 0 refills | Status: DC

## 2018-07-21 MED ORDER — SULFAMETHOXAZOLE-TRIMETHOPRIM 800-160 MG PO TABS: 1.0000 | ORAL_TABLET | Freq: Two times a day (BID) | ORAL | 0 refills | Status: AC

## 2018-07-21 MED ORDER — CYCLOBENZAPRINE HCL 10 MG PO TABS: 10.0000 mg | ORAL_TABLET | Freq: Two times a day (BID) | ORAL | 0 refills | Status: DC | PRN

## 2018-07-21 MED ORDER — TRAMADOL HCL 50 MG PO TABS: 50.0000 mg | ORAL_TABLET | Freq: Two times a day (BID) | ORAL | 0 refills | Status: DC | PRN

## 2018-07-21 NOTE — Discharge Instructions (Addendum)
We will go ahead and treat you for a kidney infection based on your symptoms and history. Take the medication as prescribed I will give you a muscle relaxant and some stronger pain medication in case this is a back related injury to include muscle strain or spasm. If your symptoms worsen or do not improve over the next couple of days you need to follow-up in person.

## 2018-07-21 NOTE — ED Provider Notes (Signed)
Virtual Visit via Video Note:  Jennifer Salazar  initiated request for Telemedicine visit with [facility] team. I connected with Jennifer Salazar  on 07/22/2018 at 10:49 AM  for a synchronized telemedicine visit using a video enabled HIPPA compliant telemedicine application. I verified that I am speaking with Jennifer Salazar  using two identifiers. Jennifer Aris, NP  was physically located in a [facility] Urgent care site and Jennifer Salazar was located at a different location.   The limitations of evaluation and management by telemedicine as well as the availability of in-person appointments were discussed. Patient was informed that she  may incur a bill ( including co-pay) for this virtual visit encounter. Jennifer Salazar  expressed understanding and gave verbal consent to proceed with virtual visit.     History of Present Illness:Jennifer Salazar  is a 38 y.o. female presents with 3 to 4 days of severe lower/mid back pain. Symptoms have been constant. Describes as dull ache/throbbing.  She has been taking tylenol for the pain. This does not help. She is unable to take NSAIDS. Denies any injury. She works at [employer] and does lift heavy items from time to time. No radiation of pain, numbness, tingling or weakness in the extremities. She checked her temperature yesterday and was 100.3. She has had some urinary retention but denies dysuria, hematuria. Hx of IC and she has had multiple bladder installations in the past. Her IC is well controlled and she hasn't had issues with this in a long time. She is also under a lot of stress due to recent passing of her father. No trouble with BM. No hx of kidney stones.   Past Medical History:  Diagnosis Date  . Anxiety   . Hypertension     Allergies  Allergen Reactions  . Benadryl [Diphenhydramine] Other (See Comments)    Internal restlessness  . Reglan [Metoclopramide]         Observations/Objective:GENERAL APPEARANCE: Well  developed, well nourished, alert and cooperative, and appears to be in no acute distress. HEAD: normocephalic. Non labored breathing, no dyspnea or distress Skin: Skin normal color  PSYCHIATRIC: The mental examination revealed the patient was oriented to person, place, and time. The patient was able to demonstrate good judgement and reason, without hallucinations, abnormal affect or abnormal behaviors during the examination. Patient is not suicidal Pt tearful.      Assessment and Plan: pt symptoms consistent with either musculoskeletal back pain versus kidney infection Reporting fever with urinary retention and low/mid back pain so the kidney infection is very likely. She has hx of UTI.  Will cover for both with pain meds, abx and muscle relaxants.     Follow Up Instructions: Strict precautions and instructions that if her symptoms continue or worsen she will need to be seen in person or go to the ER. Patient understanding and agree   I discussed the assessment and treatment plan with the patient. The patient was provided an opportunity to ask questions and all were answered. The patient agreed with the plan and demonstrated an understanding of the instructions.   The patient was advised to call back or seek an in-person evaluation if the symptoms worsen or if the condition fails to improve as anticipated.     Jennifer Aris, NP  07/22/2018 10:49 AM         Jennifer Aris, NP 07/22/18 1106

## 2018-07-28 ENCOUNTER — Encounter: Payer: Self-pay | Admitting: Family Medicine

## 2018-07-28 ENCOUNTER — Ambulatory Visit (INDEPENDENT_AMBULATORY_CARE_PROVIDER_SITE_OTHER): Payer: No Typology Code available for payment source | Admitting: Family Medicine

## 2018-07-28 ENCOUNTER — Other Ambulatory Visit: Payer: Self-pay

## 2018-07-28 VITALS — Temp 98.6°F | Ht 62.0 in | Wt 195.0 lb

## 2018-07-28 DIAGNOSIS — F411 Generalized anxiety disorder: Secondary | ICD-10-CM

## 2018-07-28 DIAGNOSIS — M67952 Unspecified disorder of synovium and tendon, left thigh: Secondary | ICD-10-CM | POA: Diagnosis not present

## 2018-07-28 DIAGNOSIS — M545 Low back pain, unspecified: Secondary | ICD-10-CM

## 2018-07-28 DIAGNOSIS — G8929 Other chronic pain: Secondary | ICD-10-CM

## 2018-07-28 DIAGNOSIS — R42 Dizziness and giddiness: Secondary | ICD-10-CM

## 2018-07-28 DIAGNOSIS — I10 Essential (primary) hypertension: Secondary | ICD-10-CM

## 2018-07-28 DIAGNOSIS — N3011 Interstitial cystitis (chronic) with hematuria: Secondary | ICD-10-CM

## 2018-07-28 DIAGNOSIS — K219 Gastro-esophageal reflux disease without esophagitis: Secondary | ICD-10-CM

## 2018-07-28 LAB — POCT URINALYSIS DIP (MANUAL ENTRY)
Bilirubin, UA: NEGATIVE
Glucose, UA: NEGATIVE mg/dL
Ketones, POC UA: NEGATIVE mg/dL
Leukocytes, UA: NEGATIVE
Nitrite, UA: NEGATIVE
Protein Ur, POC: NEGATIVE mg/dL
Spec Grav, UA: 1.02 (ref 1.010–1.025)
Urobilinogen, UA: 0.2 E.U./dL
pH, UA: 5.5 (ref 5.0–8.0)

## 2018-07-28 NOTE — Patient Instructions (Signed)
° ° ° °  If you have lab work done today you will be contacted with your lab results within the next 2 weeks.  If you have not heard from us then please contact us. The fastest way to get your results is to register for My Chart. ° ° °IF you received an x-ray today, you will receive an invoice from [facility]. Please contact [facility] at [phone] with questions or concerns regarding your invoice.  ° °IF you received labwork today, you will receive an invoice from [facility]. Please contact [facility] at [phone] with questions or concerns regarding your invoice.  ° °Our billing staff will not be able to assist you with questions regarding bills from these companies. ° °You will be contacted with the lab results as soon as they are available. The fastest way to get your results is to activate your My Chart account. Instructions are located on the last page of this paperwork. If you have not heard from us regarding the results in 2 weeks, please contact this office. °  ° ° ° °

## 2018-07-28 NOTE — Progress Notes (Signed)
6/11/202010:20 AM  Jennifer Salazar 07-21-1980, 38 y.o., female [id number]  Chief Complaint  Patient presents with  . Dizziness    still having some dizziness but not as before. Drinking alot more water, finding that she is having pain in the kidney area    HPI:   Patient is a 38 y.o. female with past medical history significant for HTN, migraines, GERD, depression/anxiety, interstitial cystitiswho presents today forroutine followup  Last seen in May 2020 by Dr Neva SeatGreene for dizziness Labs ordered, referred to cards given postural tachycardia, decreased lisinopril to 20mg  BID Seen in UC for back pain, tramadol #12 Seeing Dr Hinton DyerPucilowski, psychiatry, alprazolam #60 Saw GI, last OV April 2020, plan for EGD  Her father recently died in [place] She has been checking BP at home Similar to today at home She has cut back on caffeine significantly She stopped taking BP medication Has been drinking more water Dizziness is better Still has episodes when she feels "off" Overall anxiety better, has been working with psych on cutting back bzd  Has a constant dull achy pain in her low back Constantly feels urge to urinate without much stream This has improved with increase water intake Working exacerbates her low back pain, bending over her left side constantly as works in a [employer] operational [employer]  Uses heating pad every night Reports constant alternating diarrhea and constipation Not on her period, Mirena in place Reports dyspareunia Denies any numbness or tingling down her leg  Wt Readings from Last 3 Encounters:  07/28/18 195 lb (88.5 kg)  07/08/18 195 lb 3.2 oz (88.5 kg)  07/02/18 182 lb 15.7 oz (83 kg)    Fall Risk  07/28/2018 07/08/2018 07/08/2018 04/29/2018 03/28/2018  Falls in the past year? 0 0 0 0 0  Number falls in past yr: 0 0 0 0 0  Injury with Fall? 0 0 0 0 0  Follow up - Falls evaluation completed Falls evaluation completed Falls evaluation completed -     Dep[facility] screen [facility]  07/08/2018 04/29/2018 03/28/2018  Decreased Interest 1 1 0  Down, Depressed, Hopeless 2 0 0  PHQ - 2 Score 3 1 0  Altered sleeping 2 1 -  Tired, decreased energy 2 2 -  Change in appetite 3 2 -  Feeling bad or failure about yourself  0 1 -  Trouble concentrating 1 0 -  Moving slowly or fidgety/restless 1 1 -  Suicidal thoughts 0 0 -  PHQ-9 Score 12 8 -  Difficult doing work/chores Somewhat difficult Somewhat difficult -    Allergies  Allergen Reactions  . Benadryl [Diphenhydramine] Other (See Comments)    Internal restlessness  . Reglan [Metoclopramide]     Prior to Admission medications   Medication Sig Start Date End Date Taking? Authorizing Provider  ALPRAZolam Prudy Feeler(XANAX) 1 MG tablet Take 1 tablet (1 mg total) by mouth 3 (three) times daily as needed for anxiety. 07/18/18  Yes Pucilowski, Olgierd A, MD  butalbital-acetaminophen-caffeine (FIORICET, ESGIC) 50-325-40 MG tablet Take 1 tablet by mouth as needed for migraine. 08/24/17  Yes [provider]  cyclobenzaprine (FLEXERIL) 10 MG tablet Take 1 tablet (10 mg total) by mouth 2 (two) times daily as needed for muscle spasms. 07/21/18  Yes Bast, Traci A, NP  fluconazole (DIFLUCAN) 150 MG tablet Take 1 tablet (150 mg total) by mouth daily. 07/21/18  Yes Bast, Traci A, NP  levonorgestrel (MIRENA) 20 MCG/24HR IUD by Intrauterine route.   Yes [provider]  lisinopril (PRINIVIL,ZESTRIL) 20 MG  tablet Take 1 tablet (20 mg total) by mouth 2 (two) times daily. 03/22/18  Yes Rutherford Guys, MD  meclizine (ANTIVERT) 25 MG tablet Take 1 tablet (25 mg total) by mouth 3 (three) times daily as needed for dizziness. 07/02/18  Yes Julianne Rice, MD  ondansetron (ZOFRAN) 4 MG tablet Take 1 tablet (4 mg total) by mouth every 6 (six) hours. 07/02/18  Yes Julianne Rice, MD  pantoprazole (PROTONIX) 40 MG tablet Take 1 tablet (40 mg total) by mouth daily. 06/16/18  Yes Thornton Park, MD  potassium chloride SA (K-DUR) 20 MEQ tablet Take 1  tablet (20 mEq total) by mouth daily. 07/03/18  Yes Julianne Rice, MD  sertraline (ZOLOFT) 100 MG tablet Take 1 tablet (100 mg total) by mouth daily. 07/18/18 10/16/18 Yes Pucilowski, Olgierd A, MD  sucralfate (CARAFATE) 1 g tablet TAKE 1 TABLET BY MOUTH FOUR TIMES DAILY WITH MEALS AND AT BEDTIME FOR 14 DAYS 06/17/18  Yes Thornton Park, MD  sulfamethoxazole-trimethoprim (BACTRIM DS) 800-160 MG tablet Take 1 tablet by mouth 2 (two) times daily for 7 days. 07/21/18 07/28/18 Yes Bast, Traci A, NP  triamcinolone cream (KENALOG) 0.1 % Apply 1 application topically 2 (two) times daily.  01/07/18  Yes [provider]    Past Medical History:  Diagnosis Date  . Anxiety   . Hypertension     Past Surgical History:  Procedure Laterality Date  . WISDOM TOOTH EXTRACTION      Social History   Tobacco Use  . Smoking status: Former Smoker    Packs/day: 0.25    Years: 10.00    Pack years: 2.50    Types: Cigarettes    Quit date: 11/08/2017    Years since quitting: 0.7  . Smokeless tobacco: Never Used  Substance Use Topics  . Alcohol use: Not Currently    Frequency: Never    Family History  Problem Relation Age of Onset  . Hypertension Mother   . COPD Father   . Crohn's disease Father   . Anxiety disorder Father   . Drug abuse Sister   . Anxiety disorder Brother     ROS Per hpi  OBJECTIVE:  Today's Vitals   07/28/18 1004  Temp: 98.6 F (37 C)  SpO2: 98%  Weight: 195 lb (88.5 kg)  Height: 5\' 2"  (1.575 m)   Body mass index is 35.67 kg/m.  Lying: 115/79, 100 Standing: 109/76, 105 Standing: 109/76, 98  Physical Exam Vitals signs and nursing note reviewed.  Constitutional:      Appearance: She is well-developed.  HENT:     Head: Normocephalic and atraumatic.     Mouth/Throat:     Pharynx: No oropharyngeal exudate.  Eyes:     General: No scleral icterus.    Conjunctiva/sclera: Conjunctivae normal.     Pupils: Pupils are equal, round, and reactive to light.   Neck:     Musculoskeletal: Neck supple.  Cardiovascular:     Rate and Rhythm: Normal rate and regular rhythm.     Heart sounds: Normal heart sounds. No murmur. No friction rub. No gallop.   Pulmonary:     Effort: Pulmonary effort is normal.     Breath sounds: Normal breath sounds. No wheezing, rhonchi or rales.  Musculoskeletal:     Lumbar back: She exhibits tenderness (left paraspinal). She exhibits no bony tenderness and no spasm.     Right lower leg: No edema.     Left lower leg: No edema.     Comments: Neg  SLR +2 DTRs, normal strength + TTP left gluteus medius  Skin:    General: Skin is warm and dry.  Neurological:     Mental Status: She is alert and oriented to person, place, and time.     Results for orders placed or performed in visit on 07/28/18 (from the past 24 hour(s))  POCT urinalysis dipstick     Status: Abnormal   Collection Time: 07/28/18 10:28 AM  Result Value Ref Range   Color, UA yellow yellow   Clarity, UA clear clear   Glucose, UA negative negative mg/dL   Bilirubin, UA negative negative   Ketones, POC UA negative negative mg/dL   Spec Grav, UA [id number] 6.9621.010 - 1.025   Blood, UA moderate (A) negative   pH, UA 5.5 5.0 - 8.0   Protein Ur, POC negative negative mg/dL   Urobilinogen, UA 0.2 0.2 or 1.0 E.U./dL   Nitrite, UA Negative Negative   Leukocytes, UA Negative Negative    No results found.   ASSESSMENT and PLAN  1. Dizziness Improved off BP meds and increased fluid intake. Pending cards referral for postural tachycardia - Orthostatic vital signs  2. Chronic left-sided low back pain without sciatica 3. Tendinopathy of left gluteus medius - POCT urinalysis dipstick - Ambulatory referral to Physical Therapy  4. Interstitial cystitis (chronic) with hematuria Never eval by urogyn. Component of dyspareunia, ? Pelvic floor dysfunction? Low dose TCA?  - Ambulatory referral to Urogynecology  5. Essential hypertension Normotensive off BP meds.  Continue home monitoring.   6. Generalized anxiety disorder Improved. Managed by psych  7. Gastroesophageal reflux disease without esophagitis Improved. Managed by GI, pending EGD  Return in about 3 months (around 10/28/2018).    Myles LippsIrma M Santiago, MD Primary [facility] at [facility] [address] [place] [address] [place], [place] [address] Ph.  [phone] Fax [phone]

## 2018-07-31 ENCOUNTER — Other Ambulatory Visit (HOSPITAL_COMMUNITY): Payer: Self-pay | Admitting: Psychiatry

## 2018-08-02 ENCOUNTER — Ambulatory Visit (HOSPITAL_COMMUNITY): Payer: No Typology Code available for payment source | Admitting: Psychology

## 2018-08-02 ENCOUNTER — Encounter (HOSPITAL_COMMUNITY): Payer: Self-pay | Admitting: Psychology

## 2018-08-02 ENCOUNTER — Other Ambulatory Visit: Payer: Self-pay

## 2018-08-02 ENCOUNTER — Other Ambulatory Visit (HOSPITAL_COMMUNITY): Payer: Self-pay

## 2018-08-02 MED ORDER — ALPRAZOLAM 1 MG PO TABS: 1.0000 mg | ORAL_TABLET | Freq: Three times a day (TID) | ORAL | 0 refills | Status: DC | PRN

## 2018-08-02 MED ORDER — ALPRAZOLAM 0.5 MG PO TABS: 0.5000 mg | ORAL_TABLET | Freq: Three times a day (TID) | ORAL | 0 refills | Status: DC | PRN

## 2018-08-02 NOTE — Progress Notes (Signed)
Jennifer Salazar is a 38 y.o. female patient who didn't show for her assessment for therapy services- left message the missed appointment and to call to office if would like to reschedule.        Jan Fireman, [facility]

## 2018-08-02 NOTE — Progress Notes (Signed)
Patient called and said that she thought Dr. Mamie Nick was decreasing the Xanax to 0.5, the 1 mg was sent in. Patient states she would like to start cutting back. I have called in a 30 day order for Xanax 0.5 mg 1 po tid #90 x 0 refills. I called the pharmacy and discontinued the 1 mg order. Patient is agreeable to this plan.

## 2018-08-07 ENCOUNTER — Encounter: Payer: Self-pay | Admitting: Family Medicine

## 2018-08-09 ENCOUNTER — Other Ambulatory Visit (HOSPITAL_COMMUNITY): Payer: Self-pay | Admitting: Psychiatry

## 2018-08-10 ENCOUNTER — Encounter: Payer: Self-pay | Admitting: Family Medicine

## 2018-08-12 ENCOUNTER — Ambulatory Visit (INDEPENDENT_AMBULATORY_CARE_PROVIDER_SITE_OTHER)
Admission: RE | Admit: 2018-08-12 | Discharge: 2018-08-12 | Disposition: A | Discharge status: Home / Self Care | Payer: No Typology Code available for payment source | Source: Ambulatory Visit

## 2018-08-12 DIAGNOSIS — M545 Low back pain, unspecified: Secondary | ICD-10-CM

## 2018-08-12 MED ORDER — TRAMADOL HCL 50 MG PO TABS: 50.0000 mg | ORAL_TABLET | Freq: Four times a day (QID) | ORAL | 0 refills | Status: DC | PRN

## 2018-08-12 MED ORDER — CYCLOBENZAPRINE HCL 10 MG PO TABS: 10.0000 mg | ORAL_TABLET | Freq: Two times a day (BID) | ORAL | 0 refills | Status: DC | PRN

## 2018-08-12 NOTE — ED Provider Notes (Signed)
Virtual Visit via Video Note:  Jennifer Salazar  initiated request for Telemedicine visit with [facility] team. I connected with Jennifer Salazar  on 08/12/2018 at 5:54 PM  for a synchronized telemedicine visit using a video enabled HIPPA compliant telemedicine application. I verified that I am speaking with Jennifer Salazar  using two identifiers. Orvan July, NP  was physically located in a [facility] Urgent care site and Earma Nicolaou was located at a different location.   The limitations of evaluation and management by telemedicine as well as the availability of in-person appointments were discussed. Patient was informed that she  may incur a bill ( including co-pay) for this virtual visit encounter. Jennifer Salazar  expressed understanding and gave verbal consent to proceed with virtual visit.     History of Present Illness:Jennifer Salazar  is a 38 y.o. female presents with continued back pain. I saw her for this recently and started tramadol and flexeril which helped. Reports that she has been using the medication sparingly. No new symptoms. No numbness, tingling. No fever. Was recommended physical therapy but is very anxious about this due to COVID.   Past Medical History:  Diagnosis Date  . Anxiety   . Hypertension     Allergies  Allergen Reactions  . Benadryl [Diphenhydramine] Other (See Comments)    Internal restlessness  . Reglan [Metoclopramide]         Observations/Objective:GENERAL APPEARANCE: Well developed, well nourished, alert and cooperative, and appears to be in no acute distress. HEAD: normocephalic. Non labored breathing, no dyspnea or distress Skin: Skin normal color  PSYCHIATRIC: The mental examination revealed the patient was oriented to person, place, and time. The patient was able to demonstrate good judgement and reason, without hallucinations, abnormal affect or abnormal behaviors during the examination. Patient is not  suicidal     Assessment and Plan: Recommended physical therapy because this may be the best solution for her pain. Refilled pain medication and muscle relaxant.  Instructed that she needs to see PCP pain management for continued medication.    Follow Up Instructions: follow up with primary care    I discussed the assessment and treatment plan with the patient. The patient was provided an opportunity to ask questions and all were answered. The patient agreed with the plan and demonstrated an understanding of the instructions.   The patient was advised to call back or seek an in-person evaluation if the symptoms worsen or if the condition fails to improve as anticipated.    Orvan July, NP  08/12/2018 5:54 PM         Orvan July, NP 08/16/18 1401

## 2018-08-12 NOTE — Discharge Instructions (Addendum)
Refilling the medication this last time  I recommend doing a physical therapy that was requested by her doctor. If you continue to need pain medicine you need to follow-up with pain management or request further refills from your primary care physician.

## 2018-08-15 ENCOUNTER — Ambulatory Visit (INDEPENDENT_AMBULATORY_CARE_PROVIDER_SITE_OTHER): Payer: No Typology Code available for payment source | Admitting: Psychiatry

## 2018-08-15 ENCOUNTER — Other Ambulatory Visit: Payer: Self-pay

## 2018-08-15 DIAGNOSIS — F410 Panic disorder [episodic paroxysmal anxiety] without agoraphobia: Secondary | ICD-10-CM

## 2018-08-15 DIAGNOSIS — F411 Generalized anxiety disorder: Secondary | ICD-10-CM | POA: Diagnosis not present

## 2018-08-15 MED ORDER — ALPRAZOLAM 0.5 MG PO TABS: 0.5000 mg | ORAL_TABLET | Freq: Four times a day (QID) | ORAL | 0 refills | Status: DC | PRN

## 2018-08-15 NOTE — Progress Notes (Signed)
BH MD/PA/NP OP Progress Note  08/15/2018 10:16 AM Jennifer LoopSarah Salazar  MRN:  [id number] Interview was conducted by phone and I verified that I was speaking with the correct person using two identifiers. I discussed the limitations of evaluation and management by telemedicine and  the availability of in person appointments. Patient expressed understanding and agreed to proceed.  Chief Complaint: Anxiety.  HPI: 38 yo married female with generalized and panic type anxiety. She still has episodic panic attacks and excessive worrying (about multiple physical ailments/complaints - vertigo, headaches, heartburn). No depression, no SI. Sleep improves when she take alprazolam. Tolerates increase dose of Zoloft (100 mg) well but remains anxious while trying to go down on alprazolam (0.5 mg now, previously 1 mg, tid per panic attacks). Two of her coworkers  At [employer] tested positive for COVID-19 two weeks ago and her anxiety increased  - she has no sx of infection.  Her father in [place] passed away in May and she traveled there. Interestingly enough she feels that that trip made her feel less anxious once she returned.   Visit Diagnosis:    ICD-10-CM   1. Generalized anxiety disorder  F41.1   2. Panic disorder  F41.0     Past Psychiatric History: Please refer to intake H&P.  Past Medical History:  Past Medical History:  Diagnosis Date  . Anxiety   . Hypertension     Past Surgical History:  Procedure Laterality Date  . WISDOM TOOTH EXTRACTION      Family Psychiatric History: Reviewed.  Family History:  Family History  Problem Relation Age of Onset  . Hypertension Mother   . COPD Father   . Crohn's disease Father   . Anxiety disorder Father   . Drug abuse Sister   . Anxiety disorder Brother     Social History:  Social History   Socioeconomic History  . Marital status: Married    Spouse name: Otilio Sabertravis smith  . Number of children: 0  . Years of education: Not on file  .  Highest education level: Some college, no degree  Occupational History  . Not on file  Social Needs  . Financial resource strain: Not hard at all  . Food insecurity    Worry: Never true    Inability: Never true  . Transportation needs    Medical: No    Non-medical: No  Tobacco Use  . Smoking status: Former Smoker    Packs/day: 0.25    Years: 10.00    Pack years: 2.50    Types: Cigarettes    Quit date: 11/08/2017    Years since quitting: 0.7  . Smokeless tobacco: Never Used  Substance and Sexual Activity  . Alcohol use: Not Currently    Frequency: Never  . Drug use: Never  . Sexual activity: Yes    Birth control/protection: I.U.D.  Lifestyle  . Physical activity    Days per week: 0 days    Minutes per session: 0 min  . Stress: Very much  Relationships  . Social Musicianconnections    Talks on phone: Not on file    Gets together: Not on file    Attends religious service: Never    Active member of club or organization: No    Attends meetings of clubs or organizations: Never    Relationship status: Married  Other Topics Concern  . Not on file  Social History Narrative  . Not on file    Allergies:  Allergies  Allergen Reactions  .  Benadryl [Diphenhydramine] Other (See Comments)    Internal restlessness  . Reglan [Metoclopramide]     Metabolic Disorder Labs: No results found for: HGBA1C, MPG No results found for: PROLACTIN Lab Results  Component Value Date   CHOL 191 03/22/2018   TRIG 92 03/22/2018   HDL 46 03/22/2018   CHOLHDL 4.2 03/22/2018   LDLCALC 127 (H) 03/22/2018   Lab Results  Component Value Date   TSH 1.700 07/08/2018   TSH 2.730 03/22/2018    Therapeutic Level Labs: No results found for: LITHIUM No results found for: VALPROATE No components found for:  CBMZ  Current Medications: Current Outpatient Medications  Medication Sig Dispense Refill  . ALPRAZolam (XANAX) 0.5 MG tablet Take 1 tablet (0.5 mg total) by mouth 3 (three) times daily as  needed for anxiety. 90 tablet 0  . butalbital-acetaminophen-caffeine (FIORICET, ESGIC) 50-325-40 MG tablet Take 1 tablet by mouth as needed for migraine.    . cyclobenzaprine (FLEXERIL) 10 MG tablet Take 1 tablet (10 mg total) by mouth 2 (two) times daily as needed for muscle spasms. 20 tablet 0  . fluconazole (DIFLUCAN) 150 MG tablet Take 1 tablet (150 mg total) by mouth daily. 2 tablet 0  . levonorgestrel (MIRENA) 20 MCG/24HR IUD by Intrauterine route.    . meclizine (ANTIVERT) 25 MG tablet Take 1 tablet (25 mg total) by mouth 3 (three) times daily as needed for dizziness. 30 tablet 0  . ondansetron (ZOFRAN) 4 MG tablet Take 1 tablet (4 mg total) by mouth every 6 (six) hours. 12 tablet 0  . pantoprazole (PROTONIX) 40 MG tablet Take 1 tablet (40 mg total) by mouth daily. 90 tablet 3  . potassium chloride SA (K-DUR) 20 MEQ tablet Take 1 tablet (20 mEq total) by mouth daily. 3 tablet 0  . sertraline (ZOLOFT) 100 MG tablet Take 1 tablet (100 mg total) by mouth daily. 90 tablet 0  . sucralfate (CARAFATE) 1 g tablet TAKE 1 TABLET BY MOUTH FOUR TIMES DAILY WITH MEALS AND AT BEDTIME FOR 14 DAYS 360 tablet 1  . traMADol (ULTRAM) 50 MG tablet Take 1 tablet (50 mg total) by mouth every 6 (six) hours as needed. 10 tablet 0  . triamcinolone cream (KENALOG) 0.1 % Apply 1 application topically 2 (two) times daily.      No current facility-administered medications for this visit.     Psychiatric Specialty Exam: Review of Systems  Psychiatric/Behavioral: The patient is nervous/anxious and has insomnia.   All other systems reviewed and are negative.   There were no vitals taken for this visit.There is no height or weight on file to calculate BMI.  General Appearance: NA  Eye Contact:  NA  Speech:  Clear and Coherent  Volume:  Normal  Mood:  Anxious  Affect:  NA  Thought Process:  Goal Directed  Orientation:  Full (Time, Place, and Person)  Thought Content: Logical   Suicidal Thoughts:  No  Homicidal  Thoughts:  No  Memory:  Immediate;   Good Recent;   Good Remote;   Good  Judgement:  Good  Insight:  Fair  Psychomotor Activity:  NA  Concentration:  Concentration: Good  Recall:  Good  Fund of Knowledge: Good  Language: Good  Akathisia:  Negative  Handed:  Right  AIMS (if indicated): not done  Assets:  Communication Skills Desire for Improvement Financial Resources/Insurance Housing Intimacy Social Support  ADL's:  Intact  Cognition: WNL  Sleep:  Fair   Screenings: GAD-7  Office Visit from 07/08/2018 in [facility] at [place] from 04/29/2018 in [facility] at [place] from 03/28/2018 in [place] at [place] from 03/22/2018 in [place] at [facility]  Total GAD-7 Score  11  7  11  11     PHQ2-9     Office Visit from 07/28/2018 in [place] at [place] from 07/08/2018 in [place] at [place] from 04/29/2018 in [place] at [place] from 03/28/2018 in [place] at [place] from 03/22/2018 in [place] at [facility] Total Score  0  3  1  0  2  PHQ-9 Total Score  6  12  8   -  8       Assessment and Plan: 38 yo married female with generalized and panic type anxiety. She still has episodic panic attacks and excessive worrying (about multiple physical ailments/complaints - vertigo, headaches, heartburn). No depression, no SI. Sleep improves when she take alprazolam. Tolerates increase dose of Zoloft (100 mg) well but remains anxious while trying to go down on alprazolam (0.5 mg now, previously 1 mg, tid per panic attacks). Two of her coworkers  At [employer] tested positive for COVID-19 two weeks ago and her anxiety increased  - she has no sx of infection.  Her father in [place] passed away in 18-Jul-2022 and she traveled there. Interestingly enough she feels that that trip made her feel less anxious once she returned.  Plan: Continue alprazolam 0.5 mg prn anxiety/sleep but at this time increased  it to q 6 hr so she has some room to maneuver. Continue  Zoloft to 100 mg daily. The plan was discussed with patient who had an opportunity to ask questions and these were all answered. I spend 25 minutes on the phone with the patient. Next visit in one month.     Stephanie Acre, MD 08/15/2018, 10:16 AM

## 2018-09-05 ENCOUNTER — Encounter (HOSPITAL_COMMUNITY): Payer: Self-pay

## 2018-09-05 ENCOUNTER — Other Ambulatory Visit: Payer: Self-pay

## 2018-09-05 ENCOUNTER — Emergency Department (HOSPITAL_COMMUNITY): Payer: No Typology Code available for payment source

## 2018-09-05 ENCOUNTER — Emergency Department (HOSPITAL_COMMUNITY)
Admission: EM | Admit: 2018-09-05 | Discharge: 2018-09-05 | Disposition: A | Discharge status: Home / Self Care | Payer: No Typology Code available for payment source | Attending: Emergency Medicine | Admitting: Emergency Medicine

## 2018-09-05 DIAGNOSIS — R109 Unspecified abdominal pain: Secondary | ICD-10-CM

## 2018-09-05 DIAGNOSIS — N83201 Unspecified ovarian cyst, right side: Secondary | ICD-10-CM | POA: Diagnosis not present

## 2018-09-05 DIAGNOSIS — Z79899 Other long term (current) drug therapy: Secondary | ICD-10-CM | POA: Insufficient documentation

## 2018-09-05 DIAGNOSIS — Z87891 Personal history of nicotine dependence: Secondary | ICD-10-CM | POA: Diagnosis not present

## 2018-09-05 DIAGNOSIS — I10 Essential (primary) hypertension: Secondary | ICD-10-CM | POA: Insufficient documentation

## 2018-09-05 DIAGNOSIS — R1031 Right lower quadrant pain: Secondary | ICD-10-CM | POA: Diagnosis present

## 2018-09-05 LAB — CBC WITH DIFFERENTIAL/PLATELET
Abs Immature Granulocytes: 0.01 10*3/uL (ref 0.00–0.07)
Basophils Absolute: 0 10*3/uL (ref 0.0–0.1)
Basophils Relative: 0 %
Eosinophils Absolute: 0.1 10*3/uL (ref 0.0–0.5)
Eosinophils Relative: 2 %
HCT: 44 % (ref 36.0–46.0)
Hemoglobin: 14.5 g/dL (ref 12.0–15.0)
Immature Granulocytes: 0 %
Lymphocytes Relative: 20 %
Lymphs Abs: 1.5 10*3/uL (ref 0.7–4.0)
MCH: 31.5 pg (ref 26.0–34.0)
MCHC: 33 g/dL (ref 30.0–36.0)
MCV: 95.4 fL (ref 80.0–100.0)
Monocytes Absolute: 0.5 10*3/uL (ref 0.1–1.0)
Monocytes Relative: 6 %
Neutro Abs: 5.4 10*3/uL (ref 1.7–7.7)
Neutrophils Relative %: 72 %
Platelets: 276 10*3/uL (ref 150–400)
RBC: 4.61 MIL/uL (ref 3.87–5.11)
RDW: 12.1 % (ref 11.5–15.5)
WBC: 7.5 10*3/uL (ref 4.0–10.5)
nRBC: 0 % (ref 0.0–0.2)

## 2018-09-05 LAB — LIPASE, BLOOD: Lipase: 23 U/L (ref 11–51)

## 2018-09-05 LAB — COMPREHENSIVE METABOLIC PANEL
ALT: 16 U/L (ref 0–44)
AST: 21 U/L (ref 15–41)
Albumin: 3.7 g/dL (ref 3.5–5.0)
Alkaline Phosphatase: 56 U/L (ref 38–126)
Anion gap: 8 (ref 5–15)
BUN: 8 mg/dL (ref 6–20)
CO2: 22 mmol/L (ref 22–32)
Calcium: 8.8 mg/dL — ABNORMAL LOW (ref 8.9–10.3)
Chloride: 109 mmol/L (ref 98–111)
Creatinine, Ser: 0.84 mg/dL (ref 0.44–1.00)
GFR calc Af Amer: >60 mL/min (ref >60)
GFR calc non Af Amer: >60 mL/min (ref >60)
Glucose, Bld: 105 mg/dL — ABNORMAL HIGH (ref 70–99)
Potassium: 4.2 mmol/L (ref 3.5–5.1)
Sodium: 139 mmol/L (ref 135–145)
Total Bilirubin: 0.2 mg/dL — ABNORMAL LOW (ref 0.3–1.2)
Total Protein: 6.9 g/dL (ref 6.5–8.1)

## 2018-09-05 LAB — PREGNANCY, URINE: Preg Test, Ur: NEGATIVE

## 2018-09-05 LAB — URINALYSIS, COMPLETE (UACMP) WITH MICROSCOPIC
Bilirubin Urine: NEGATIVE
Glucose, UA: NEGATIVE mg/dL
Ketones, ur: NEGATIVE mg/dL
Leukocytes,Ua: NEGATIVE
Nitrite: NEGATIVE
Protein, ur: NEGATIVE mg/dL
Specific Gravity, Urine: 1.008 (ref 1.005–1.030)
pH: 5 (ref 5.0–8.0)

## 2018-09-05 MED ORDER — IOHEXOL 300 MG/ML  SOLN
75.0000 mL | Freq: Once | INTRAMUSCULAR | Status: AC | PRN
  Administered 2018-09-05: 14:00:00 75 mL via INTRAVENOUS

## 2018-09-05 MED ORDER — SODIUM CHLORIDE 0.9 % IV BOLUS
1000.0000 mL | Freq: Once | INTRAVENOUS | Status: AC
  Administered 2018-09-05: 1000 mL via INTRAVENOUS

## 2018-09-05 MED ORDER — KETOROLAC TROMETHAMINE 15 MG/ML IJ SOLN: 15.0000 mg | Freq: Once | INTRAMUSCULAR | Status: DC

## 2018-09-05 MED ORDER — OXYCODONE-ACETAMINOPHEN 5-325 MG PO TABS: 1.0000 | ORAL_TABLET | Freq: Once | ORAL | Status: DC

## 2018-09-05 MED ORDER — MORPHINE SULFATE (PF) 4 MG/ML IV SOLN
4.0000 mg | Freq: Once | INTRAVENOUS | Status: AC
  Administered 2018-09-05: 4 mg via INTRAVENOUS
  Filled 2018-09-05: qty 1

## 2018-09-05 MED ORDER — HYDROCODONE-ACETAMINOPHEN 5-325 MG PO TABS: 1.0000 | ORAL_TABLET | Freq: Four times a day (QID) | ORAL | 0 refills | Status: DC | PRN

## 2018-09-05 MED ORDER — NAPROXEN 500 MG PO TABS: 500.0000 mg | ORAL_TABLET | Freq: Two times a day (BID) | ORAL | 0 refills | Status: DC

## 2018-09-05 NOTE — ED Provider Notes (Signed)
[facility]-EMERGENCY DEPT Provider Note   CSN: [id number] Arrival date & time: 09/05/18  [id number]    History   Chief Complaint No chief complaint on file.   HPI Jennifer Salazar is a 38 y.o. female with history of anxiety, hypertension, bladder problems, GERD presents today for abdominal pain.  3-day history of lower abdominal pain primarily in the right lower quadrant throbbing constant worsened with palpation and movement and without alleviating factors moderate in intensity.  Reports she has never had similar pain in the past.  Patient reports that she has had abdominal pain in the past and is trying to establish care with a gastroenterologist for her GERD she reports that her GERD pain is typically more generalized and in her upper left quadrant she believes that she has a hiatal hernia but this is not causing her problem and is not the reason for her visit, she reports her right lower quadrant pain is new.  Patient reports that she has had nausea for the past 3 days without vomiting.  2-3 episodes of loose nonbloody stools. - With review of triage notes patient does states she has an IUD in place but this is not bothering her she denies any current vaginal discharge or vaginal bleeding.  She has an OB/GYN that she follows up with.  She does endorse occasional difficulty with urination but that is not new for her and she has been followed by her PCP, OB/GYN is attempting to establish care with urology, patient reports that she has had normal "dynamic studies" performed, she denies bowel/bladder incontinence or saddle area paresthesias, denies urinary symptoms time.  She denies any dysuria/hematuria or concern for STD.  No fever/chills, cough, chest pain/shortness of breath, fall/injury or any additional concerns.    HPI  Past Medical History:  Diagnosis Date  . Anxiety   . Hypertension     Patient Active Problem List   Diagnosis Date Noted  . Panic disorder  04/26/2018  . Generalized anxiety disorder 03/26/2018  . Bladder problem 11/06/2016  . Hypertensive disorder 11/06/2016  . Gastroesophageal reflux disease without esophagitis 11/06/2016    Past Surgical History:  Procedure Laterality Date  . WISDOM TOOTH EXTRACTION       OB History   No obstetric history on file.      Home Medications    Prior to Admission medications   Medication Sig Start Date End Date Taking? Authorizing Provider  ALPRAZolam Prudy Feeler(XANAX) 0.5 MG tablet Take 1 tablet (0.5 mg total) by mouth every 6 (six) hours as needed for anxiety. 08/15/18   Pucilowski, Roosvelt Maserlgierd A, MD  butalbital-acetaminophen-caffeine (FIORICET, ESGIC) 50-325-40 MG tablet Take 1 tablet by mouth as needed for migraine. 08/24/17   [provider]  cyclobenzaprine (FLEXERIL) 10 MG tablet Take 1 tablet (10 mg total) by mouth 2 (two) times daily as needed for muscle spasms. 08/12/18   Dahlia ByesBast, Traci A, NP  fluconazole (DIFLUCAN) 150 MG tablet Take 1 tablet (150 mg total) by mouth daily. 07/21/18   Janace ArisBast, Traci A, NP  levonorgestrel (MIRENA) 20 MCG/24HR IUD by Intrauterine route.    [provider]  meclizine (ANTIVERT) 25 MG tablet Take 1 tablet (25 mg total) by mouth 3 (three) times daily as needed for dizziness. 07/02/18   Loren RacerYelverton, David, MD  ondansetron (ZOFRAN) 4 MG tablet Take 1 tablet (4 mg total) by mouth every 6 (six) hours. 07/02/18   Loren RacerYelverton, David, MD  pantoprazole (PROTONIX) 40 MG tablet Take 1 tablet (40 mg total) by  mouth daily. 06/16/18   Thornton Park, MD  potassium chloride SA (K-DUR) 20 MEQ tablet Take 1 tablet (20 mEq total) by mouth daily. 07/03/18   Julianne Rice, MD  sertraline (ZOLOFT) 100 MG tablet Take 1 tablet (100 mg total) by mouth daily. 08/09/18 11/07/18  Pucilowski, Olgierd A, MD  sucralfate (CARAFATE) 1 g tablet TAKE 1 TABLET BY MOUTH FOUR TIMES DAILY WITH MEALS AND AT BEDTIME FOR 14 DAYS 06/17/18   Thornton Park, MD  traMADol (ULTRAM) 50 MG tablet Take 1  tablet (50 mg total) by mouth every 6 (six) hours as needed. 08/12/18   Augusto Gamble B, NP  triamcinolone cream (KENALOG) 0.1 % Apply 1 application topically 2 (two) times daily.  01/07/18   [provider]    Family History Family History  Problem Relation Age of Onset  . Hypertension Mother   . COPD Father   . Crohn's disease Father   . Anxiety disorder Father   . Drug abuse Sister   . Anxiety disorder Brother     Social History Social History   Tobacco Use  . Smoking status: Former Smoker    Packs/day: 0.25    Years: 10.00    Pack years: 2.50    Types: Cigarettes    Quit date: 11/08/2017    Years since quitting: 0.8  . Smokeless tobacco: Never Used  Substance Use Topics  . Alcohol use: Not Currently    Frequency: Never  . Drug use: Never     Allergies   Benadryl [diphenhydramine] and Reglan [metoclopramide]   Review of Systems Review of Systems Ten systems are reviewed and are negative for acute change except as noted in the HPI  Physical Exam Updated Vital Signs BP (!) 136/103 (BP Location: Right Arm)   Pulse (!) 105   Temp 99 F (37.2 C) (Oral)   Resp 14   Wt 88.9 kg   SpO2 100%   BMI 35.85 kg/m   Physical Exam Constitutional:      General: She is not in acute distress.    Appearance: Normal appearance. She is well-developed. She is not ill-appearing or diaphoretic.  HENT:     Head: Normocephalic and atraumatic.     Right Ear: External ear normal.     Left Ear: External ear normal.     Nose: Nose normal.  Eyes:     General: Vision grossly intact. Gaze aligned appropriately.     Pupils: Pupils are equal, round, and reactive to light.  Neck:     Musculoskeletal: Normal range of motion.     Trachea: Trachea and phonation normal. No tracheal deviation.  Cardiovascular:     Rate and Rhythm: Normal rate and regular rhythm.     Pulses: Normal pulses.     Heart sounds: Normal heart sounds.  Pulmonary:     Effort: Pulmonary effort is  normal. No respiratory distress.  Abdominal:     General: There is no distension.     Palpations: Abdomen is soft.     Tenderness: There is abdominal tenderness in the right lower quadrant. There is guarding. There is no rebound. Positive signs include McBurney's sign. Negative signs include Murphy's sign and Rovsing's sign.  Genitourinary:    Comments: Deferred x2 Musculoskeletal: Normal range of motion.  Skin:    General: Skin is warm and dry.  Neurological:     Mental Status: She is alert.     GCS: GCS eye subscore is 4. GCS verbal subscore is 5. GCS motor  subscore is 6.     Comments: Speech is clear and goal oriented, follows commands Major Cranial nerves without deficit, no facial droop Moves extremities without ataxia, coordination intact  Psychiatric:        Behavior: Behavior normal.    ED Treatments / Results  Labs (all labs ordered are listed, but only abnormal results are displayed) Labs Reviewed  URINALYSIS, COMPLETE (UACMP) WITH MICROSCOPIC - Abnormal; Notable for the following components:      Result Value   Hgb urine dipstick MODERATE (*)    Bacteria, UA RARE (*)    All other components within normal limits  COMPREHENSIVE METABOLIC PANEL - Abnormal; Notable for the following components:   Glucose, Bld 105 (*)    Calcium 8.8 (*)    Total Bilirubin 0.2 (*)    All other components within normal limits  PREGNANCY, URINE  CBC WITH DIFFERENTIAL/PLATELET  LIPASE, BLOOD    EKG None  Radiology No results found.  Procedures Procedures (including critical care time)  Medications Ordered in ED Medications  iohexol (OMNIPAQUE) 300 MG/ML solution 75 mL (has no administration in time range)  sodium chloride 0.9 % bolus 1,000 mL (1,000 mLs Intravenous New Bag/Given 09/05/18 1303)  morphine 4 MG/ML injection 4 mg (4 mg Intravenous Given 09/05/18 1303)     Initial Impression / Assessment and Plan / ED Course  I have reviewed the triage vital signs and the nursing  notes.  Pertinent labs & imaging results that were available during my care of the patient were reviewed by me and considered in my medical decision making (see chart for details).     Urine pregnancy negative  CBC within normal limit CMP nonacute Lipase within normal limits Urinalysis with hemoglobin and rare bacteria, negative leukocyte and nitrate, without urinary symptoms do not suspect infection CT abdomen pelvis:  IMPRESSION:  Small umbilical hernia containing fat.    IUD within expected position within the uterus.    Probable splenule medial to spleen adjacent to but separate from the  LEFT adrenal gland.    No acute intra-abdominal or intrapelvic abnormalities.  - Dominant right follicle seen on CT abdomen pelvis, with right lower quadrant pain and female will obtain pelvic ultrasound to evaluate for torsion.  Patient has for a second time refused pelvic examination.  Additional pain medication ordered. - [facility]cell[facility] PA-C at shift change, pending negative ultrasound and reassessment anticipate discharge with PCP, GI and urology follow-ups.   Note: Portions of this report may have been transcribed using voice recognition software. Every effort was made to ensure accuracy; however, inadvertent computerized transcription errors may still be present. Final Clinical Impressions(s) / ED Diagnoses   Final diagnoses:  None    ED Discharge Orders    None       Elizabeth PalauMorelli,  A, PA-C 09/05/18 1515    Maia PlanLong, Joshua G, MD 09/06/18 1028

## 2018-09-05 NOTE — ED Notes (Signed)
When pt returned from restroom, this nurse attempted to restart fluids but pt complained of pain and burning so fluids were d/c.

## 2018-09-05 NOTE — ED Provider Notes (Addendum)
15:00 Assumed care of patient from Nuala Alpha PA-C at change of shift pending ultrasound- if negative for torsion plan for discharge home.   Please see prior provider note for full H&P.  Briefly patient presented w/ 3 day history of RLQ abdominal pain.  She has had labs & CT abdomen/pelvis. No appendicitis.  She was noted to have a dominant follicle RIGHT ovary 2.3 x 1.6 cm.--> Prior provider discussed w/ supervising physician: plan for [place] to r/o torsion.  Patient refused pelvic exam- per prior team doubt PID- monogamous relationship, no complaints of vaginal discharge.   Results for orders placed or performed during the hospital encounter of 09/05/18  Urinalysis, Complete w Microscopic  Result Value Ref Range   Color, Urine YELLOW YELLOW   APPearance CLEAR CLEAR   Specific Gravity, Urine 1.008 1.005 - 1.030   pH 5.0 5.0 - 8.0   Glucose, UA NEGATIVE NEGATIVE mg/dL   Hgb urine dipstick MODERATE (A) NEGATIVE   Bilirubin Urine NEGATIVE NEGATIVE   Ketones, ur NEGATIVE NEGATIVE mg/dL   Protein, ur NEGATIVE NEGATIVE mg/dL   Nitrite NEGATIVE NEGATIVE   Leukocytes,Ua NEGATIVE NEGATIVE   RBC / HPF 0-5 0 - 5 RBC/hpf   Bacteria, UA RARE (A) NONE SEEN   Squamous Epithelial / LPF 0-5 0 - 5   Mucus PRESENT   Pregnancy, urine  Result Value Ref Range   Preg Test, Ur NEGATIVE NEGATIVE  CBC with Differential  Result Value Ref Range   WBC 7.5 4.0 - 10.5 K/uL   RBC 4.61 3.87 - 5.11 MIL/uL   Hemoglobin 14.5 12.0 - 15.0 g/dL   HCT 44.0 36.0 - 46.0 %   MCV 95.4 80.0 - 100.0 fL   MCH 31.5 26.0 - 34.0 pg   MCHC 33.0 30.0 - 36.0 g/dL   RDW 12.1 11.5 - 15.5 %   Platelets 276 150 - 400 K/uL   nRBC 0.0 0.0 - 0.2 %   Neutrophils Relative % 72 %   Neutro Abs 5.4 1.7 - 7.7 K/uL   Lymphocytes Relative 20 %   Lymphs Abs 1.5 0.7 - 4.0 K/uL   Monocytes Relative 6 %   Monocytes Absolute 0.5 0.1 - 1.0 K/uL   Eosinophils Relative 2 %   Eosinophils Absolute 0.1 0.0 - 0.5 K/uL   Basophils Relative 0 %    Basophils Absolute 0.0 0.0 - 0.1 K/uL   Immature Granulocytes 0 %   Abs Immature Granulocytes 0.01 0.00 - 0.07 K/uL  Comprehensive metabolic panel  Result Value Ref Range   Sodium 139 135 - 145 mmol/L   Potassium 4.2 3.5 - 5.1 mmol/L   Chloride 109 98 - 111 mmol/L   CO2 22 22 - 32 mmol/L   Glucose, Bld 105 (H) 70 - 99 mg/dL   BUN 8 6 - 20 mg/dL   Creatinine, Ser 0.84 0.44 - 1.00 mg/dL   Calcium 8.8 (L) 8.9 - 10.3 mg/dL   Total Protein 6.9 6.5 - 8.1 g/dL   Albumin 3.7 3.5 - 5.0 g/dL   AST 21 15 - 41 U/L   ALT 16 0 - 44 U/L   Alkaline Phosphatase 56 38 - 126 U/L   Total Bilirubin 0.2 (L) 0.3 - 1.2 mg/dL   GFR calc non Af Amer >60 >60 mL/min   GFR calc Af Amer >60 >60 mL/min   Anion gap 8 5 - 15  Lipase, blood  Result Value Ref Range   Lipase 23 11 - 51 U/L   US  Transvaginal Non-ob  Result Date: 09/05/2018 CLINICAL DATA:  Right lower quadrant pain EXAM: TRANSABDOMINAL AND TRANSVAGINAL ULTRASOUND OF PELVIS DOPPLER ULTRASOUND OF OVARIES TECHNIQUE: Both transabdominal and transvaginal ultrasound examinations of the pelvis were performed. Transabdominal technique was performed for global imaging of the pelvis including uterus, ovaries, adnexal regions, and pelvic cul-de-sac. It was necessary to proceed with endovaginal exam following the transabdominal exam to visualize the ovaries. Color and duplex Doppler ultrasound was utilized to evaluate blood flow to the ovaries. COMPARISON:  None. FINDINGS: Uterus Measurements: 6.5 x 4.0 x 3.6 cm. = volume: 49 mL. No fibroids or other mass visualized. Endometrium Thickness: 5 mm.  An IUD is noted in satisfactory position. Right ovary Measurements: 3.4 x 2.5 x 2.4 cm. = volume: 10.7 mL. 1.9 cm complex cystic lesion is noted within the right ovary. This is similar to that seen on prior CT. Small central echogenicity is noted and this likely represents a hemorrhagic cyst. Left ovary Measurements: 2.4 x 1.2 x 1.5 cm. = volume: 2.3 mL. Follicular changes on  the left are noted. Pulsed Doppler evaluation of both ovaries demonstrates normal low-resistance arterial and venous waveforms. Other findings Trace likely physiologic fluid is noted. IMPRESSION: Complex cystic lesion in the right ovary which likely represents a hemorrhagic cyst. IUD in place. No other focal abnormality is noted. Electronically Signed   By: Alcide CleverMark  Lukens M.D.   On: 09/05/2018 16:24   [place] Pelvis Complete  Result Date: 09/05/2018 CLINICAL DATA:  Right lower quadrant pain EXAM: TRANSABDOMINAL AND TRANSVAGINAL ULTRASOUND OF PELVIS DOPPLER ULTRASOUND OF OVARIES TECHNIQUE: Both transabdominal and transvaginal ultrasound examinations of the pelvis were performed. Transabdominal technique was performed for global imaging of the pelvis including uterus, ovaries, adnexal regions, and pelvic cul-de-sac. It was necessary to proceed with endovaginal exam following the transabdominal exam to visualize the ovaries. Color and duplex Doppler ultrasound was utilized to evaluate blood flow to the ovaries. COMPARISON:  None. FINDINGS: Uterus Measurements: 6.5 x 4.0 x 3.6 cm. = volume: 49 mL. No fibroids or other mass visualized. Endometrium Thickness: 5 mm.  An IUD is noted in satisfactory position. Right ovary Measurements: 3.4 x 2.5 x 2.4 cm. = volume: 10.7 mL. 1.9 cm complex cystic lesion is noted within the right ovary. This is similar to that seen on prior CT. Small central echogenicity is noted and this likely represents a hemorrhagic cyst. Left ovary Measurements: 2.4 x 1.2 x 1.5 cm. = volume: 2.3 mL. Follicular changes on the left are noted. Pulsed Doppler evaluation of both ovaries demonstrates normal low-resistance arterial and venous waveforms. Other findings Trace likely physiologic fluid is noted. IMPRESSION: Complex cystic lesion in the right ovary which likely represents a hemorrhagic cyst. IUD in place. No other focal abnormality is noted. Electronically Signed   By: Alcide CleverMark  Lukens M.D.   On: 09/05/2018  16:24   Ct Abdomen Pelvis W Contrast  Result Date: 09/05/2018 CLINICAL DATA:  Lower abdominopelvic pain question appendicitis, has IUD EXAM: CT ABDOMEN AND PELVIS WITH CONTRAST TECHNIQUE: Multidetector CT imaging of the abdomen and pelvis was performed using the standard protocol following bolus administration of intravenous contrast. Sagittal and coronal MPR images reconstructed from axial data set. CONTRAST:  75mL OMNIPAQUE IOHEXOL 300 MG/ML SOLN IV. No oral contrast. COMPARISON:  None FINDINGS: Lower chest: Lung bases clear Hepatobiliary: Gallbladder and liver normal appearance Pancreas: Normal appearance Spleen: Normal appearance. 3.0 x 2.0 x 2.7 cm diameter mass adjacent to the LEFT adrenal gland, medial to the LEFT spleen, with attenuation  measurements similar to splenic tissue, favor splenule. Adrenals/Urinary Tract: Adrenal glands, kidneys, ureters, and bladder normal appearance Stomach/Bowel: Normal appendix, retrocecal. Stomach and bowel loops normal appearance for technique Vascular/Lymphatic: Vascular structures patent and unremarkable. No adenopathy. Reproductive: IUD within expected position within uterus. Unremarkable LEFT ovary. Dominant follicle RIGHT ovary 2.3 x 1.6 cm. Other: No free air or free fluid. Small umbilical hernia containing fat. No inflammatory process. Musculoskeletal: Unremarkable IMPRESSION: Small umbilical hernia containing fat. IUD within expected position within the uterus. 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 No acute intra-abdominal or intrapelvic abnormalities. Electronically Signed   By: Ulyses SouthwardMark  Boles M.D.   On: 09/05/2018 14:33   [place] Art/ven Flow Abd Pelv Doppler  Result Date: 09/05/2018 CLINICAL DATA:  Right lower quadrant pain EXAM: TRANSABDOMINAL AND TRANSVAGINAL ULTRASOUND OF PELVIS DOPPLER ULTRASOUND OF OVARIES TECHNIQUE: Both transabdominal and transvaginal ultrasound examinations of the pelvis were performed.  Transabdominal technique was performed for global imaging of the pelvis including uterus, ovaries, adnexal regions, and pelvic cul-de-sac. It was necessary to proceed with endovaginal exam following the transabdominal exam to visualize the ovaries. Color and duplex Doppler ultrasound was utilized to evaluate blood flow to the ovaries. COMPARISON:  None. FINDINGS: Uterus Measurements: 6.5 x 4.0 x 3.6 cm. = volume: 49 mL. No fibroids or other mass visualized. Endometrium Thickness: 5 mm.  An IUD is noted in satisfactory position. Right ovary Measurements: 3.4 x 2.5 x 2.4 cm. = volume: 10.7 mL. 1.9 cm complex cystic lesion is noted within the right ovary. This is similar to that seen on prior CT. Small central echogenicity is noted and this likely represents a hemorrhagic cyst. Left ovary Measurements: 2.4 x 1.2 x 1.5 cm. = volume: 2.3 mL. Follicular changes on the left are noted. Pulsed Doppler evaluation of both ovaries demonstrates normal low-resistance arterial and venous waveforms. Other findings Trace likely physiologic fluid is noted. IMPRESSION: Complex cystic lesion in the right ovary which likely represents a hemorrhagic cyst. IUD in place. No other focal abnormality is noted. Electronically Signed   By: Alcide CleverMark  Lukens M.D.   On: 09/05/2018 16:24   Ultrasound notable for complex cystic lesion of the R ovary which likely represents hemorrhagic cyst. No other acute abnormalities. No torsion.   Hemorrhagic ovarian cyst likely cause of patient's sxs.  Will give toradol in the ER Prescription for naproxen to take at home as needed.  obgyn follow up.   I discussed results, treatment plan, need for follow-up, and return precautions with the patient. Provided opportunity for questions, patient confirmed understanding and is in agreement with plan.   After initial conversation with the patient & discharge paperwork printed patient informed nursing staff she is having endoscopy tomorrow and states she is  unable to take NSAIDs- toradol not given, hold off on naproxen until after procedure & her discussion with GI. -- She is adamant that she needs something else to take tonight as opposed to tylenol, after thorough discussion with patient will given her a few tablets of norco to take for severe pain.  [place] Cont[place] Sub[place] System queried- discussed she cannot take Norco with xanax as it is overlying sedating. Discharge instructions re-printed.      Cherly Andersonetrucelli, Hazen Brumett R, PA-C 09/05/18 [id number]    Noelie Renfrow R, PA-C 09/05/18 1732    Desmond Lopeetrucelli, Denica Web R, PA-C 09/05/18 1801    Azalia Bilisampos, Kevin, MD 09/05/18 1906

## 2018-09-05 NOTE — ED Triage Notes (Signed)
Pt states she has an IUD in place. Pt states she has been waiting for urology and GI since April. Pt states she has had felt need to void, but will sometimes sit on toilet for 20 minutes without success. Pt c/o lower abd/pelvic pain.

## 2018-09-05 NOTE — Discharge Instructions (Addendum)
You have been diagnosed today with abdominal pain. Your ultrasound showed an ovarian cyst on the right side We are sending you home with norco & naproxen to help with pain. --> please wait until after your procedure tomorrow and discuss safety of this medicine with your GI doctor prior to start the naproxen & make sure they are okay with you taking this.   -Norco-this is a narcotic/controlled substance medication that has potential addicting qualities.  We recommend that you take 1-2 tablets every 6 hours as needed for severe pain.  Do not drive or operate heavy machinery when taking this medicine as it can be sedating. Do not drink alcohol or take other sedating medications when taking this medicine for safety reasons.  Keep this out of reach of small children.  Please be aware this medicine has Tylenol in it (325 mg/tab) do not exceed the maximum dose of Tylenol in a day per over the counter recommendations should you decide to supplement with Tylenol over the counter.   - Naproxen is a nonsteroidal anti-inflammatory medication that will help with pain and swelling. Be sure to take this medication as prescribed with food, 1 pill every 12 hours,  It should be taken with food, as it can cause stomach upset, and more seriously, stomach bleeding. Do not take other nonsteroidal anti-inflammatory medications with this such as Advil, Motrin, Aleve, Mobic, Goodie Powder, or Motrin.    We have prescribed you new medication(s) today. Discuss the medications prescribed today with your pharmacist as they can have adverse effects and interactions with your other medicines including over the counter and prescribed medications. Seek medical evaluation if you start to experience new or abnormal symptoms after taking one of these medicines, seek care immediately if you start to experience difficulty breathing, feeling of your throat closing, facial swelling, or rash as these could be indications of a more serious allergic  reaction  At this time there does not appear to be the presence of an emergent medical condition, however there is always the potential for conditions to change. Please read and follow the below instructions.  Please return to the Emergency Department immediately for any new or worsening symptoms or if your symptoms. Please be sure to follow up with your Primary Care Provider within one week regarding your visit today; please call their office to schedule an appointment even if you are feeling better for a follow-up visit. Please follow up with obgyn within 1 week regarding your ovarian cyst.  Please call your urologist in gastroenterologist tomorrow morning to schedule follow-up appointment regarding your visit today.   Please read the additional information packets attached to your discharge summary.

## 2018-09-05 NOTE — ED Notes (Signed)
Pelvic set up at bedside. Pt is tearful at this time as she is scared being here by herself. RN provided verbal reassurance to patient

## 2018-09-06 ENCOUNTER — Telehealth: Payer: Self-pay | Admitting: Family Medicine

## 2018-09-06 ENCOUNTER — Inpatient Hospital Stay: Admission: RE | Admit: 2018-09-06 | Payer: No Typology Code available for payment source | Source: Ambulatory Visit

## 2018-09-06 DIAGNOSIS — N83201 Unspecified ovarian cyst, right side: Secondary | ICD-10-CM

## 2018-09-06 NOTE — Telephone Encounter (Signed)
Copied from [place] [id number]. Topic: Referral - Request for Referral >> Sep 06, 2018 11:48 AM Yvette Rack wrote: Has patient seen PCP for this complaint? yes  *If NO, is insurance requiring patient see PCP for this issue before PCP can refer them? Referral for which specialty: Gynecologist Preferred provider/office: no specific provider Reason for referral: female issues

## 2018-09-06 NOTE — Telephone Encounter (Signed)
done

## 2018-09-07 ENCOUNTER — Emergency Department (HOSPITAL_COMMUNITY)
Admission: EM | Admit: 2018-09-07 | Discharge: 2018-09-07 | Disposition: A | Discharge status: Home / Self Care | Payer: No Typology Code available for payment source | Attending: Emergency Medicine | Admitting: Emergency Medicine

## 2018-09-07 ENCOUNTER — Other Ambulatory Visit: Payer: Self-pay

## 2018-09-07 ENCOUNTER — Ambulatory Visit (INDEPENDENT_AMBULATORY_CARE_PROVIDER_SITE_OTHER): Payer: No Typology Code available for payment source | Admitting: Family Medicine

## 2018-09-07 VITALS — BP 127/89 | HR 103 | Temp 99.2°F | Ht 62.0 in | Wt 195.2 lb

## 2018-09-07 DIAGNOSIS — R1031 Right lower quadrant pain: Secondary | ICD-10-CM | POA: Diagnosis present

## 2018-09-07 DIAGNOSIS — N83209 Unspecified ovarian cyst, unspecified side: Secondary | ICD-10-CM | POA: Diagnosis not present

## 2018-09-07 DIAGNOSIS — I10 Essential (primary) hypertension: Secondary | ICD-10-CM | POA: Diagnosis not present

## 2018-09-07 DIAGNOSIS — N83201 Unspecified ovarian cyst, right side: Secondary | ICD-10-CM | POA: Insufficient documentation

## 2018-09-07 DIAGNOSIS — Z87891 Personal history of nicotine dependence: Secondary | ICD-10-CM | POA: Insufficient documentation

## 2018-09-07 DIAGNOSIS — Z79899 Other long term (current) drug therapy: Secondary | ICD-10-CM | POA: Insufficient documentation

## 2018-09-07 LAB — WET PREP, GENITAL
Clue Cells Wet Prep HPF POC: NONE SEEN
Sperm: NONE SEEN
Trich, Wet Prep: NONE SEEN
Yeast Wet Prep HPF POC: NONE SEEN

## 2018-09-07 MED ORDER — HYDROMORPHONE HCL 1 MG/ML IJ SOLN
1.0000 mg | Freq: Once | INTRAMUSCULAR | Status: AC
  Administered 2018-09-07: 19:00:00 1 mg via INTRAMUSCULAR
  Filled 2018-09-07: qty 1

## 2018-09-07 MED ORDER — HYDROMORPHONE HCL 1 MG/ML IJ SOLN
1.0000 mg | Freq: Once | INTRAMUSCULAR | Status: AC
  Administered 2018-09-07: 1 mg via INTRAMUSCULAR
  Filled 2018-09-07: qty 1

## 2018-09-07 MED ORDER — OXYCODONE-ACETAMINOPHEN 5-325 MG PO TABS: 1.0000 | ORAL_TABLET | ORAL | 0 refills | Status: DC | PRN

## 2018-09-07 MED ORDER — KETOROLAC TROMETHAMINE 15 MG/ML IJ SOLN
15.0000 mg | Freq: Once | INTRAMUSCULAR | Status: AC
  Administered 2018-09-07: 15 mg via INTRAMUSCULAR

## 2018-09-07 NOTE — ED Notes (Signed)
Pelvic setup at bedside.

## 2018-09-07 NOTE — Progress Notes (Signed)
In ER on 09/05/18 for abdominal pain. A cyst was found at that time on the right ovary. Pain medication that was given is now gone, She states that she cant take Nsaids due to father passing away of [place]. IUD was placed 3 years ago in [place]. Patient states that she is in extreme pain, A UTI was obtained in the ER as well as an Ultra Sound

## 2018-09-07 NOTE — Patient Instructions (Addendum)
GYN referral -cyst noted on ovary

## 2018-09-07 NOTE — ED Notes (Signed)
Had to wake up patient to ask about pain scale no respiratory or acute distress noted call light in reach alert and oriented x 3.

## 2018-09-07 NOTE — ED Triage Notes (Signed)
Pt was seen on Monday for abdominal pain and went to her PCP today. Pt reports the pain medication she was giving did not take away her pain and she needs a GI followup.

## 2018-09-07 NOTE — Discharge Instructions (Addendum)
I reviewed your scans from the other day.  You have a hemorrhagic (bleeding) right ovarian cyst.  Prescription for pain medicine.  Follow-up with OB/GYN.  We will call if there is any abnormal findings from your pelvic cultures

## 2018-09-07 NOTE — ED Provider Notes (Addendum)
[facility]-EMERGENCY DEPT Provider Note   CSN: [id number] Arrival date & time: 09/07/18  1317    History   Chief Complaint Chief Complaint  Patient presents with  . Abdominal Pain    HPI Jennifer Salazar is a [age] y.o. female.     Chief complaint persistent right lower quadrant pain.  Patient was seen in the emergency department on 09/05/2018 with same complaint.  CT abdomen pelvis and pelvic ultrasound were obtained which revealed a right hemorrhagic ovarian cyst.  Pain has persisted.  She has an IUD.  She is married and has had one sexual partner.  No dysuria, hematuria, vaginal discharge, unusual vaginal bleeding.  Severity is moderate.  Palpation makes pain worse.  Gravida 0.     Past Medical History:  Diagnosis Date  . Anxiety   . Hypertension     Patient Active Problem List   Diagnosis Date Noted  . Hemorrhagic cyst of ovary 09/07/2018  . Panic disorder 04/26/2018  . Generalized anxiety disorder 03/26/2018  . Bladder problem 11/06/2016  . Hypertensive disorder 11/06/2016  . Gastroesophageal reflux disease without esophagitis 11/06/2016    Past Surgical History:  Procedure Laterality Date  . WISDOM TOOTH EXTRACTION       OB History   No obstetric history on file.      Home Medications    Prior to Admission medications   Medication Sig Start Date End Date Taking? Authorizing Provider  ALPRAZolam Prudy Feeler(XANAX) 0.5 MG tablet Take 1 tablet (0.5 mg total) by mouth every 6 (six) hours as needed for anxiety. 08/15/18  Yes Pucilowski, Olgierd A, MD  calcium carbonate (TUMS - DOSED IN MG ELEMENTAL CALCIUM) 500 MG chewable tablet Chew 1 tablet by mouth 2 (two) times daily as needed for indigestion or heartburn.   Yes [provider]  HYDROcodone-acetaminophen (NORCO/VICODIN) 5-325 MG tablet Take 1-2 tablets by mouth every 6 (six) hours as needed. Patient taking differently: Take 1-2 tablets by mouth every 6 (six) hours as needed for moderate  pain.  09/05/18  Yes Petrucelli, Pleas KochSamantha R, PA-C  levonorgestrel (MIRENA) 20 MCG/24HR IUD by Intrauterine route.   Yes [provider]  lisinopril (ZESTRIL) 10 MG tablet Take 10 mg by mouth daily.   Yes [provider]  omeprazole (PRILOSEC) 40 MG capsule Take 40 mg by mouth daily as needed (heartburn).   Yes [provider]  sertraline (ZOLOFT) 100 MG tablet Take 1 tablet (100 mg total) by mouth daily. 08/09/18 11/07/18 Yes Pucilowski, Olgierd A, MD  triamcinolone cream (KENALOG) 0.1 % Apply 1 application topically 2 (two) times daily.  01/07/18  Yes [provider]  butalbital-acetaminophen-caffeine (FIORICET, ESGIC) 50-325-40 MG tablet Take 1 tablet by mouth as needed for migraine. 08/24/17   [provider]  cyclobenzaprine (FLEXERIL) 10 MG tablet Take 1 tablet (10 mg total) by mouth 2 (two) times daily as needed for muscle spasms. 08/12/18   Dahlia ByesBast, Traci A, NP  meclizine (ANTIVERT) 25 MG tablet Take 1 tablet (25 mg total) by mouth 3 (three) times daily as needed for dizziness. 07/02/18   Loren RacerYelverton, David, MD  naproxen (NAPROSYN) 500 MG tablet Take 1 tablet (500 mg total) by mouth 2 (two) times daily. Patient not taking: Reported on 09/07/2018 09/05/18   Petrucelli, Pleas KochSamantha R, PA-C  oxyCODONE-acetaminophen (PERCOCET) 5-325 MG tablet Take 1 tablet by mouth every 4 (four) hours as needed. 09/07/18   Donnetta Hutchingook, Esra Frankowski, MD  sucralfate (CARAFATE) 1 g tablet TAKE 1 TABLET BY MOUTH FOUR TIMES DAILY  WITH MEALS AND AT BEDTIME FOR 14 DAYS Patient not taking: Reported on 09/05/2018 06/17/18   Tressia DanasBeavers, Kimberly, MD  pantoprazole (PROTONIX) 40 MG tablet Take 1 tablet (40 mg total) by mouth daily. Patient not taking: Reported on 09/05/2018 06/16/18 09/05/18  Tressia DanasBeavers, Kimberly, MD  potassium chloride SA (K-DUR) 20 MEQ tablet Take 1 tablet (20 mEq total) by mouth daily. Patient not taking: Reported on 09/05/2018 07/03/18 09/05/18  Loren RacerYelverton, David, MD    Family History Family History   Problem Relation Age of Onset  . Hypertension Mother   . COPD Father   . Crohn's disease Father   . Anxiety disorder Father   . Drug abuse Sister   . Anxiety disorder Brother     Social History Social History   Tobacco Use  . Smoking status: Former Smoker    Packs/day: 0.25    Years: 10.00    Pack years: 2.50    Types: Cigarettes    Quit date: 11/08/2017    Years since quitting: 0.8  . Smokeless tobacco: Never Used  Substance Use Topics  . Alcohol use: Not Currently    Frequency: Never  . Drug use: Never     Allergies   Benadryl [diphenhydramine] and Reglan [metoclopramide]   Review of Systems Review of Systems  All other systems reviewed and are negative.    Physical Exam Updated Vital Signs BP (!) 149/81   Pulse 73   Temp 98.7 F (37.1 C) (Oral)   Resp 16   SpO2 99%   Physical Exam Vitals signs and nursing note reviewed.  Constitutional:      Appearance: She is well-developed.  HENT:     Head: Normocephalic and atraumatic.  Eyes:     Conjunctiva/sclera: Conjunctivae normal.  Neck:     Musculoskeletal: Neck supple.  Cardiovascular:     Rate and Rhythm: Normal rate and regular rhythm.  Pulmonary:     Effort: Pulmonary effort is normal.     Breath sounds: Normal breath sounds.  Abdominal:     General: Bowel sounds are normal.     Palpations: Abdomen is soft.     Comments: Tender right lower pelvic area  Genitourinary:    Comments: Pelvic exam: Normal external genitalia.  Slight cervical discharge, but no cervical motion tenderness.  Positive right adnexal tenderness.  Left adnexa nontender. Musculoskeletal: Normal range of motion.  Skin:    General: Skin is warm and dry.  Neurological:     Mental Status: She is alert and oriented to person, place, and time.  Psychiatric:        Behavior: Behavior normal.      ED Treatments / Results  Labs (all labs ordered are listed, but only abnormal results are displayed) Labs Reviewed  WET PREP,  GENITAL - Abnormal; Notable for the following components:      Result Value   WBC, Wet Prep HPF POC FEW (*)    All other components within normal limits  GC/CHLAMYDIA PROBE AMP ([place]    EKG None  Radiology No results found.  Procedures Procedures (including critical care time)  Medications Ordered in ED Medications  HYDROmorphone (DILAUDID) injection 1 mg (1 mg Intramuscular Given 09/07/18 1516)  HYDROmorphone (DILAUDID) injection 1 mg (1 mg Intramuscular Given 09/07/18 1927)     Initial Impression / Assessment and Plan / ED Course  I have reviewed the triage vital signs and the nursing notes.  Pertinent labs & imaging results that were available during my  care of the patient were reviewed by me and considered in my medical decision making (see chart for details).        History and physical most consistent with persistent pain secondary to right hemorrhagic ovarian cyst.  Dilaudid 1 mg IM.  Pelvic exam.  CT and ultrasound reviewed from 2 days ago which revealed a right hemorrhagic ovarian cyst.  Discharge medication Percocet.  Follow-up with OB/GYN.  Final Clinical Impressions(s) / ED Diagnoses   Final diagnoses:  Right ovarian cyst    ED Discharge Orders         Ordered    oxyCODONE-acetaminophen (PERCOCET) 5-325 MG tablet  Every 4 hours PRN     09/07/18 1946           Nat Christen, MD 09/07/18 1515    Nat Christen, MD 09/07/18 [phone]

## 2018-09-08 NOTE — Progress Notes (Signed)
Acute Office Visit  Subjective:    Patient ID: Jennifer Salazar, female    DOB: 16-Jun-1980, 38 y.o.   MRN: [id number]  Chief Complaint  Patient presents with  . Abdominal Pain    sharpe pain lower right     HPI Patient is in today to follow up ER visit on 09/05/18 for abdominal pain. A cyst was found at that time on the right ovary by ultrasound. Pain medication that was given is now gone, She states that she cant take Nsaids due to father passing away from chron's. IUD was placed 3 years ago in [place]. Patient states that she is in extreme pain, A urine was obtained in the ER as well as an Ultra Sound.  Pt took her last norco overnight and is asking for additional pain medication.   Past Medical History:  Diagnosis Date  . Anxiety   . Hypertension     Past Surgical History:  Procedure Laterality Date  . WISDOM TOOTH EXTRACTION      Family History  Problem Relation Age of Onset  . Hypertension Mother   . COPD Father   . Crohn's disease Father   . Anxiety disorder Father   . Drug abuse Sister   . Anxiety disorder Brother     Social History   Socioeconomic History  . Marital status: Married    Spouse name: Otilio Sabertravis smith  . Number of children: 0  . Years of education: Not on file  . Highest education level: Some college, no degree  Occupational History  . Not on file  Social Needs  . Financial resource strain: Not hard at all  . Food insecurity    Worry: Never true    Inability: Never true  . Transportation needs    Medical: No    Non-medical: No  Tobacco Use  . Smoking status: Former Smoker    Packs/day: 0.25    Years: 10.00    Pack years: 2.50    Types: Cigarettes    Quit date: 11/08/2017    Years since quitting: 0.8  . Smokeless tobacco: Never Used  Substance and Sexual Activity  . Alcohol use: Not Currently    Frequency: Never  . Drug use: Never  . Sexual activity: Yes    Birth control/protection: I.U.D.  Lifestyle  . Physical activity   Days per week: 0 days    Minutes per session: 0 min  . Stress: Very much  Relationships  . Social Musicianconnections    Talks on phone: Not on file    Gets together: Not on file    Attends religious service: Never    Active member of club or organization: No    Attends meetings of clubs or organizations: Never    Relationship status: Married  . Intimate partner violence    Fear of current or ex partner: No    Emotionally abused: No    Physically abused: No    Forced sexual activity: No  Other Topics Concern  . Not on file  Social History Narrative  . Not on file    Outpatient Medications Prior to Visit  Medication Sig Dispense Refill  . ALPRAZolam (XANAX) 0.5 MG tablet Take 1 tablet (0.5 mg total) by mouth every 6 (six) hours as needed for anxiety. 120 tablet 0  . butalbital-acetaminophen-caffeine (FIORICET, ESGIC) 50-325-40 MG tablet Take 1 tablet by mouth as needed for migraine.    . calcium carbonate (TUMS - DOSED IN MG ELEMENTAL CALCIUM) 500 MG chewable tablet  Chew 1 tablet by mouth 2 (two) times daily as needed for indigestion or heartburn.    . cyclobenzaprine (FLEXERIL) 10 MG tablet Take 1 tablet (10 mg total) by mouth 2 (two) times daily as needed for muscle spasms. 20 tablet 0  . HYDROcodone-acetaminophen (NORCO/VICODIN) 5-325 MG tablet Take 1-2 tablets by mouth every 6 (six) hours as needed. (Patient taking differently: Take 1-2 tablets by mouth every 6 (six) hours as needed for moderate pain. ) 6 tablet 0  . levonorgestrel (MIRENA) 20 MCG/24HR IUD by Intrauterine route.    Marland Kitchen. lisinopril (ZESTRIL) 10 MG tablet Take 10 mg by mouth daily.    . meclizine (ANTIVERT) 25 MG tablet Take 1 tablet (25 mg total) by mouth 3 (three) times daily as needed for dizziness. 30 tablet 0  . naproxen (NAPROSYN) 500 MG tablet Take 1 tablet (500 mg total) by mouth 2 (two) times daily. (Patient not taking: Reported on 09/07/2018) 10 tablet 0  . omeprazole (PRILOSEC) 40 MG capsule Take 40 mg by mouth daily  as needed (heartburn).    . sertraline (ZOLOFT) 100 MG tablet Take 1 tablet (100 mg total) by mouth daily. 90 tablet 0  . sucralfate (CARAFATE) 1 g tablet TAKE 1 TABLET BY MOUTH FOUR TIMES DAILY WITH MEALS AND AT BEDTIME FOR 14 DAYS (Patient not taking: Reported on 09/05/2018) 360 tablet 1  . triamcinolone cream (KENALOG) 0.1 % Apply 1 application topically 2 (two) times daily.      No facility-administered medications prior to visit.     Allergies  Allergen Reactions  . Benadryl [Diphenhydramine] Other (See Comments)    Internal restlessness  . Reglan [Metoclopramide]     Review of Systems  Constitutional: Negative for chills and malaise/fatigue.  Gastrointestinal: Positive for abdominal pain.  Genitourinary: Negative for dysuria, frequency and urgency.       Objective:    Physical Exam  Constitutional: She appears well-developed and well-nourished. She appears distressed.  Abdominal: Soft. Bowel sounds are normal. She exhibits no distension. There is abdominal tenderness. There is no rebound and no guarding.    BP 127/89 (BP Location: Right Arm, Patient Position: Sitting, Cuff Size: Normal)   Pulse (!) 103   Temp 99.2 F (37.3 C) (Oral)   Ht 5\' 2"  (1.575 m)   Wt 195 lb 3.2 oz (88.5 kg)   SpO2 97%   BMI 35.70 kg/m  Wt Readings from Last 3 Encounters:  09/07/18 195 lb 3.2 oz (88.5 kg)  09/05/18 196 lb (88.9 kg)  07/28/18 195 lb (88.5 kg)    Health Maintenance Due  Topic Date Due  . PAP SMEAR-Modifier  04/05/2001    There are no preventive care reminders to display for this patient.   Lab Results  Component Value Date   TSH 1.700 07/08/2018   Lab Results  Component Value Date   WBC 7.5 09/05/2018   HGB 14.5 09/05/2018   HCT 44.0 09/05/2018   MCV 95.4 09/05/2018   PLT 276 09/05/2018   Lab Results  Component Value Date   NA 139 09/05/2018   K 4.2 09/05/2018   CO2 22 09/05/2018   GLUCOSE 105 (H) 09/05/2018   BUN 8 09/05/2018   CREATININE 0.84  09/05/2018   BILITOT 0.2 (L) 09/05/2018   ALKPHOS 56 09/05/2018   AST 21 09/05/2018   ALT 16 09/05/2018   PROT 6.9 09/05/2018   ALBUMIN 3.7 09/05/2018   CALCIUM 8.8 (L) 09/05/2018   ANIONGAP 8 09/05/2018   Lab Results  Component  Value Date   CHOL 191 03/22/2018   Lab Results  Component Value Date   HDL 46 03/22/2018   Lab Results  Component Value Date   LDLCALC 127 (H) 03/22/2018   Lab Results  Component Value Date   TRIG 92 03/22/2018   Lab Results  Component Value Date   CHOLHDL 4.2 03/22/2018      Assessment & Plan:   Problem List Items Addressed This Visit      Endocrine   Hemorrhagic cyst of ovary - Primary   Relevant Orders   Ambulatory referral to Obstetrics / Gynecology     D/w pt referral to GYN-pt agreed to trial of toradol. If continued pain return to ER as no narcotics available for injection  Meds ordered this encounter  Medications  . ketorolac (TORADOL) 15 MG/ML injection 15 mg     LISA Hannah Beat, MD

## 2018-09-09 LAB — GC/CHLAMYDIA PROBE AMP (~~LOC~~) NOT AT ARMC
Chlamydia: NEGATIVE
Neisseria Gonorrhea: NEGATIVE

## 2018-09-10 ENCOUNTER — Other Ambulatory Visit: Payer: Self-pay

## 2018-09-10 ENCOUNTER — Emergency Department (HOSPITAL_COMMUNITY): Payer: No Typology Code available for payment source

## 2018-09-10 ENCOUNTER — Encounter (HOSPITAL_COMMUNITY): Payer: Self-pay | Admitting: Emergency Medicine

## 2018-09-10 ENCOUNTER — Emergency Department (HOSPITAL_COMMUNITY)
Admission: EM | Admit: 2018-09-10 | Discharge: 2018-09-10 | Disposition: A | Discharge status: Home / Self Care | Payer: No Typology Code available for payment source | Attending: Emergency Medicine | Admitting: Emergency Medicine

## 2018-09-10 DIAGNOSIS — R102 Pelvic and perineal pain: Secondary | ICD-10-CM | POA: Insufficient documentation

## 2018-09-10 DIAGNOSIS — Z79899 Other long term (current) drug therapy: Secondary | ICD-10-CM | POA: Insufficient documentation

## 2018-09-10 DIAGNOSIS — Z87891 Personal history of nicotine dependence: Secondary | ICD-10-CM | POA: Insufficient documentation

## 2018-09-10 DIAGNOSIS — N83201 Unspecified ovarian cyst, right side: Secondary | ICD-10-CM | POA: Diagnosis not present

## 2018-09-10 DIAGNOSIS — I10 Essential (primary) hypertension: Secondary | ICD-10-CM | POA: Diagnosis not present

## 2018-09-10 DIAGNOSIS — N83209 Unspecified ovarian cyst, unspecified side: Secondary | ICD-10-CM

## 2018-09-10 LAB — CBC
HCT: 42.3 % (ref 36.0–46.0)
Hemoglobin: 13.9 g/dL (ref 12.0–15.0)
MCH: 31.4 pg (ref 26.0–34.0)
MCHC: 32.9 g/dL (ref 30.0–36.0)
MCV: 95.5 fL (ref 80.0–100.0)
Platelets: 269 10*3/uL (ref 150–400)
RBC: 4.43 MIL/uL (ref 3.87–5.11)
RDW: 12.1 % (ref 11.5–15.5)
WBC: 6.6 10*3/uL (ref 4.0–10.5)
nRBC: 0 % (ref 0.0–0.2)

## 2018-09-10 LAB — BASIC METABOLIC PANEL
Anion gap: 8 (ref 5–15)
BUN: 12 mg/dL (ref 6–20)
CO2: 24 mmol/L (ref 22–32)
Calcium: 9 mg/dL (ref 8.9–10.3)
Chloride: 108 mmol/L (ref 98–111)
Creatinine, Ser: 0.81 mg/dL (ref 0.44–1.00)
GFR calc Af Amer: >60 mL/min (ref >60)
GFR calc non Af Amer: >60 mL/min (ref >60)
Glucose, Bld: 106 mg/dL — ABNORMAL HIGH (ref 70–99)
Potassium: 4.3 mmol/L (ref 3.5–5.1)
Sodium: 140 mmol/L (ref 135–145)

## 2018-09-10 LAB — I-STAT BETA HCG BLOOD, ED (MC, WL, AP ONLY): I-stat hCG, quantitative: <5 m[IU]/mL (ref <5)

## 2018-09-10 MED ORDER — SODIUM CHLORIDE 0.9 % IV BOLUS
500.0000 mL | Freq: Once | INTRAVENOUS | Status: AC
  Administered 2018-09-10: 500 mL via INTRAVENOUS

## 2018-09-10 MED ORDER — TRAMADOL HCL 50 MG PO TABS: 50.0000 mg | ORAL_TABLET | Freq: Four times a day (QID) | ORAL | 0 refills | Status: DC | PRN

## 2018-09-10 MED ORDER — MORPHINE SULFATE (PF) 4 MG/ML IV SOLN
4.0000 mg | Freq: Once | INTRAVENOUS | Status: AC
  Administered 2018-09-10: 4 mg via INTRAVENOUS
  Filled 2018-09-10: qty 1

## 2018-09-10 MED ORDER — OXYCODONE-ACETAMINOPHEN 5-325 MG PO TABS: 1.0000 | ORAL_TABLET | ORAL | 0 refills | Status: DC | PRN

## 2018-09-10 NOTE — ED Triage Notes (Signed)
Pt c/o pelvic pains that has been going on over week. Reports was told has cyst but pains arent getting any better. Had some spotting few days ago when wiped.

## 2018-09-10 NOTE — Discharge Instructions (Signed)
Please take Tylenol (acetaminophen) to relieve your pain.  You may take tylenol, up to 1,000 mg (two extra strength pills).  Do not take more than 3,000 mg tylenol in a 24 hour period.  Please check all medication labels as many medications such as pain and cold medications may contain tylenol. Please do not drink alcohol while taking this medication.  ° °

## 2018-09-10 NOTE — ED Notes (Signed)
Pt refused discharge vital signs

## 2018-09-10 NOTE — ED Notes (Signed)
US at bedside

## 2018-09-10 NOTE — ED Provider Notes (Signed)
[facility]-EMERGENCY DEPT Provider Note   CSN: [id number] Arrival date & time: 09/10/18  1204    History   Chief Complaint Chief Complaint  Patient presents with   Pelvic Pain    HPI Jennifer Salazar is a 38 y.o. female with a past medical history of hypertension, GERD, who presents today for evaluation of right pelvic pain.  She has been seen on 7/20, 7/22, and today for abdominal pain.  She had a ultrasound and CT on 09/05/2018 showing concern for a right-sided hemorrhagic cyst.  She reports that initially her pain was sharp and intermittent however has since become constant and unbearable.  She states that due to her GERD she is unable to take NSAIDs.  She states that she took the prescribed pain medicine this morning at about 8 however her pain was too severe to go to work.  She denies any trauma or injury.  She denies any vaginal spotting or bleeding.       HPI  Past Medical History:  Diagnosis Date   Anxiety    Hypertension     Patient Active Problem List   Diagnosis Date Noted   Hemorrhagic cyst of ovary 09/07/2018   Panic disorder 04/26/2018   Generalized anxiety disorder 03/26/2018   Bladder problem 11/06/2016   Hypertensive disorder 11/06/2016   Gastroesophageal reflux disease without esophagitis 11/06/2016    Past Surgical History:  Procedure Laterality Date   WISDOM TOOTH EXTRACTION       OB History   No obstetric history on file.      Home Medications    Prior to Admission medications   Medication Sig Start Date End Date Taking? Authorizing Provider  ALPRAZolam Prudy Feeler) 0.5 MG tablet Take 1 tablet (0.5 mg total) by mouth every 6 (six) hours as needed for anxiety. 08/15/18   Pucilowski, Roosvelt Maser, MD  butalbital-acetaminophen-caffeine (FIORICET, ESGIC) 50-325-40 MG tablet Take 1 tablet by mouth as needed for migraine. 08/24/17   [provider]  calcium carbonate (TUMS - DOSED IN MG ELEMENTAL CALCIUM) 500 MG  chewable tablet Chew 1 tablet by mouth 2 (two) times daily as needed for indigestion or heartburn.    [provider]  cyclobenzaprine (FLEXERIL) 10 MG tablet Take 1 tablet (10 mg total) by mouth 2 (two) times daily as needed for muscle spasms. 08/12/18   Dahlia Byes A, NP  HYDROcodone-acetaminophen (NORCO/VICODIN) 5-325 MG tablet Take 1-2 tablets by mouth every 6 (six) hours as needed. Patient taking differently: Take 1-2 tablets by mouth every 6 (six) hours as needed for moderate pain.  09/05/18   Petrucelli, Pleas Koch, PA-C  levonorgestrel (MIRENA) 20 MCG/24HR IUD by Intrauterine route.    [provider]  lisinopril (ZESTRIL) 10 MG tablet Take 10 mg by mouth daily.    [provider]  meclizine (ANTIVERT) 25 MG tablet Take 1 tablet (25 mg total) by mouth 3 (three) times daily as needed for dizziness. 07/02/18   Loren Racer, MD  naproxen (NAPROSYN) 500 MG tablet Take 1 tablet (500 mg total) by mouth 2 (two) times daily. Patient not taking: Reported on 09/07/2018 09/05/18   Petrucelli, Pleas Koch, PA-C  omeprazole (PRILOSEC) 40 MG capsule Take 40 mg by mouth daily as needed (heartburn).    [provider]  oxyCODONE-acetaminophen (PERCOCET) 5-325 MG tablet Take 1 tablet by mouth every 4 (four) hours as needed. 09/07/18   Donnetta Hutching, MD  sertraline (ZOLOFT) 100 MG tablet Take 1 tablet (100 mg total) by mouth daily.  08/09/18 11/07/18  Pucilowski, Olgierd A, MD  sucralfate (CARAFATE) 1 g tablet TAKE 1 TABLET BY MOUTH FOUR TIMES DAILY WITH MEALS AND AT BEDTIME FOR 14 DAYS Patient not taking: Reported on 09/05/2018 06/17/18   Tressia DanasBeavers, Kimberly, MD  triamcinolone cream (KENALOG) 0.1 % Apply 1 application topically 2 (two) times daily.  01/07/18   [provider]  pantoprazole (PROTONIX) 40 MG tablet Take 1 tablet (40 mg total) by mouth daily. Patient not taking: Reported on 09/05/2018 06/16/18 09/05/18  Tressia DanasBeavers, Kimberly, MD  potassium chloride SA (K-DUR) 20 MEQ  tablet Take 1 tablet (20 mEq total) by mouth daily. Patient not taking: Reported on 09/05/2018 07/03/18 09/05/18  Loren RacerYelverton, David, MD    Family History Family History  Problem Relation Age of Onset   Hypertension Mother    COPD Father    Crohn's disease Father    Anxiety disorder Father    Drug abuse Sister    Anxiety disorder Brother     Social History Social History   Tobacco Use   Smoking status: Former Smoker    Packs/day: 0.25    Years: 10.00    Pack years: 2.50    Types: Cigarettes    Quit date: 11/08/2017    Years since quitting: 0.8   Smokeless tobacco: Never Used  Substance Use Topics   Alcohol use: Not Currently    Frequency: Never   Drug use: Never     Allergies   Benadryl [diphenhydramine] and Reglan [metoclopramide]   Review of Systems Review of Systems  Constitutional: Negative for chills and fever.  Respiratory: Negative for shortness of breath.   Cardiovascular: Negative for chest pain, palpitations and leg swelling.  Genitourinary: Positive for pelvic pain. Negative for difficulty urinating, dysuria, flank pain, frequency, genital sores, hematuria, menstrual problem, urgency, vaginal bleeding and vaginal discharge.  Musculoskeletal: Negative for back pain and neck pain.  Neurological: Negative for weakness.  All other systems reviewed and are negative.    Physical Exam Updated Vital Signs BP (!) 140/110 (BP Location: Left Arm)    Pulse 82    Temp 99 F (37.2 C) (Oral)    Resp 18    Wt 88.6 kg    SpO2 97%    BMI 35.74 kg/m   Physical Exam Vitals signs and nursing note reviewed.  Constitutional:      Appearance: She is well-developed. She is not diaphoretic.     Comments: Appears uncomfortable  HENT:     Head: Normocephalic and atraumatic.  Eyes:     General: No scleral icterus.       Right eye: No discharge.        Left eye: No discharge.     Conjunctiva/sclera: Conjunctivae normal.  Neck:     Musculoskeletal: Normal range of  motion.  Cardiovascular:     Rate and Rhythm: Normal rate and regular rhythm.  Pulmonary:     Effort: Pulmonary effort is normal. No respiratory distress.     Breath sounds: No stridor.  Abdominal:     General: Abdomen is flat. There is no distension.     Tenderness: There is abdominal tenderness (Right lower quadrant).  Genitourinary:    Comments: Deferred Musculoskeletal:        General: No deformity.  Skin:    General: Skin is warm and dry.  Neurological:     General: No focal deficit present.     Mental Status: She is alert.     Motor: No abnormal muscle tone.  Psychiatric:  Mood and Affect: Mood normal.        Behavior: Behavior normal.      ED Treatments / Results  Labs (all labs ordered are listed, but only abnormal results are displayed) Labs Reviewed  BASIC METABOLIC PANEL - Abnormal; Notable for the following components:      Result Value   Glucose, Bld 106 (*)    All other components within normal limits  CBC  I-STAT BETA HCG BLOOD, ED ([facility], [facility], [facility] ONLY)    EKG None  Radiology [place] Transvaginal Non-ob  Result Date: 09/10/2018 CLINICAL DATA:  RIGHT-sided pelvic pain for 1 week, worsened pain, changed from intermittent to constant sharp and stabbing over 4-5 days EXAM: TRANSABDOMINAL AND TRANSVAGINAL ULTRASOUND OF PELVIS DOPPLER ULTRASOUND OF OVARIES TECHNIQUE: Both transabdominal and transvaginal ultrasound examinations of the pelvis were performed. Transabdominal technique was performed for global imaging of the pelvis including uterus, ovaries, adnexal regions, and pelvic cul-de-sac. It was necessary to proceed with endovaginal exam following the transabdominal exam to visualize the uterus, endometrium, and ovaries. Color and duplex Doppler ultrasound was utilized to evaluate blood flow to the ovaries. COMPARISON:  09/05/2018 FINDINGS: Uterus Measurements: 7.3 x 3.2 x 4.3 cm = volume: 53 mL. Anteverted. Normal morphology without mass Endometrium Thickness: 9  mm. No endometrial fluid. IUD much better visualized on prior study. Right ovary Measurements: 4.0 x 2.7 x 3.2 cm = volume: 18.3 mL. Complex cyst RIGHT ovary 2.2 x 2.5 x 2.1 cm with appearance most consistent with a hemorrhagic cyst, slightly larger than on the previous exam. Left ovary Measurements: 2.3 x 1.3 x 1.5 cm = volume: 2.2 mL. Less well visualized due to position. Grossly normal morphology without mass. Pulsed Doppler evaluation of both ovaries demonstrates normal low-resistance arterial and venous waveforms. Other findings No free pelvic fluid.  No additional adnexal masses. IMPRESSION: IUD less well visualized than on previous study. Slight increase in size of a likely hemorrhagic cyst of the RIGHT ovary now 2.5 cm in greatest size; short-interval follow up ultrasound in 6-12 weeks is recommended, preferably during the week following the patient's normal menses. Remainder of exam unremarkable. No evidence of ovarian torsion. Electronically Signed   By: Ulyses SouthwardMark  Boles M.D.   On: 09/10/2018 15:17   [place] Pelvis Complete  Result Date: 09/10/2018 CLINICAL DATA:  RIGHT-sided pelvic pain for 1 week, worsened pain, changed from intermittent to constant sharp and stabbing over 4-5 days EXAM: TRANSABDOMINAL AND TRANSVAGINAL ULTRASOUND OF PELVIS DOPPLER ULTRASOUND OF OVARIES TECHNIQUE: Both transabdominal and transvaginal ultrasound examinations of the pelvis were performed. Transabdominal technique was performed for global imaging of the pelvis including uterus, ovaries, adnexal regions, and pelvic cul-de-sac. It was necessary to proceed with endovaginal exam following the transabdominal exam to visualize the uterus, endometrium, and ovaries. Color and duplex Doppler ultrasound was utilized to evaluate blood flow to the ovaries. COMPARISON:  09/05/2018 FINDINGS: Uterus Measurements: 7.3 x 3.2 x 4.3 cm = volume: 53 mL. Anteverted. Normal morphology without mass Endometrium Thickness: 9 mm. No endometrial fluid. IUD  much better visualized on prior study. Right ovary Measurements: 4.0 x 2.7 x 3.2 cm = volume: 18.3 mL. Complex cyst RIGHT ovary 2.2 x 2.5 x 2.1 cm with appearance most consistent with a hemorrhagic cyst, slightly larger than on the previous exam. Left ovary Measurements: 2.3 x 1.3 x 1.5 cm = volume: 2.2 mL. Less well visualized due to position. Grossly normal morphology without mass. Pulsed Doppler evaluation of both ovaries demonstrates normal low-resistance arterial and venous  waveforms. Other findings No free pelvic fluid.  No additional adnexal masses. IMPRESSION: IUD less well visualized than on previous study. Slight increase in size of a likely hemorrhagic cyst of the RIGHT ovary now 2.5 cm in greatest size; short-interval follow up ultrasound in 6-12 weeks is recommended, preferably during the week following the patient's normal menses. Remainder of exam unremarkable. No evidence of ovarian torsion. Electronically Signed   By: Ulyses SouthwardMark  Boles M.D.   On: 09/10/2018 15:17   [place] Art/ven Flow Abd Pelv Doppler  Result Date: 09/10/2018 CLINICAL DATA:  RIGHT-sided pelvic pain for 1 week, worsened pain, changed from intermittent to constant sharp and stabbing over 4-5 days EXAM: TRANSABDOMINAL AND TRANSVAGINAL ULTRASOUND OF PELVIS DOPPLER ULTRASOUND OF OVARIES TECHNIQUE: Both transabdominal and transvaginal ultrasound examinations of the pelvis were performed. Transabdominal technique was performed for global imaging of the pelvis including uterus, ovaries, adnexal regions, and pelvic cul-de-sac. It was necessary to proceed with endovaginal exam following the transabdominal exam to visualize the uterus, endometrium, and ovaries. Color and duplex Doppler ultrasound was utilized to evaluate blood flow to the ovaries. COMPARISON:  09/05/2018 FINDINGS: Uterus Measurements: 7.3 x 3.2 x 4.3 cm = volume: 53 mL. Anteverted. Normal morphology without mass Endometrium Thickness: 9 mm. No endometrial fluid. IUD much better  visualized on prior study. Right ovary Measurements: 4.0 x 2.7 x 3.2 cm = volume: 18.3 mL. Complex cyst RIGHT ovary 2.2 x 2.5 x 2.1 cm with appearance most consistent with a hemorrhagic cyst, slightly larger than on the previous exam. Left ovary Measurements: 2.3 x 1.3 x 1.5 cm = volume: 2.2 mL. Less well visualized due to position. Grossly normal morphology without mass. Pulsed Doppler evaluation of both ovaries demonstrates normal low-resistance arterial and venous waveforms. Other findings No free pelvic fluid.  No additional adnexal masses. IMPRESSION: IUD less well visualized than on previous study. Slight increase in size of a likely hemorrhagic cyst of the RIGHT ovary now 2.5 cm in greatest size; short-interval follow up ultrasound in 6-12 weeks is recommended, preferably during the week following the patient's normal menses. Remainder of exam unremarkable. No evidence of ovarian torsion. Electronically Signed   By: Ulyses SouthwardMark  Boles M.D.   On: 09/10/2018 15:17    Procedures Procedures (including critical care time)  Medications Ordered in ED Medications  sodium chloride 0.9 % bolus 500 mL (500 mLs Intravenous New Bag/Given (Non-Interop) 09/10/18 1344)  morphine 4 MG/ML injection 4 mg (4 mg Intravenous Given 09/10/18 1345)     Initial Impression / Assessment and Plan / ED Course  I have reviewed the triage vital signs and the nursing notes.  Pertinent labs & imaging results that were available during my care of the patient were reviewed by me and considered in my medical decision making (see chart for details).       Patient presents today for evaluation of continuing pelvic pain.  This is her third ED visit in the past 5 days and fourth overall visit.  Chart review shows that she has had abdominal CAT scan, pelvic ultrasound and labs performed previously.  She reports that she was taking her pain medicine at home however her pain was unmanageable today.    Previous imaging had shown concern  for a right-sided hemorrhagic cyst.  She reports she is unable to take NSAIDs due to GERD and concern for gastric ulcer.  On exam she does appear uncomfortable.  She reports a change in her pain that it was initially intermittent however has now become  constant and worsened.  Pelvic exam was not performed as she is not having vaginal bleeding or discharge and had a pelvic exam performed within the past week.  Labs are obtained and reviewed, she is not anemic and does not have any significant hematologic or electrolyte derangements.  hCG is negative.  Given the change in the nature of her pain will obtain repeat pelvic ultrasound as it is possible that she was initially having a intermittent torsion that has become constant and is at overall increased risk of torsion given that she has an ovarian cyst.    At shift change care was transferred to [facility] PA-C who will follow pending studies, re-evaulate and determine disposition.      Final Clinical Impressions(s) / ED Diagnoses   Final diagnoses:  Pelvic pain in female  Hemorrhagic cyst of ovary    ED Discharge Orders    None       Ollen Gross 09/10/18 1524    Lajean Saver, MD 09/10/18 [phone]

## 2018-09-13 ENCOUNTER — Other Ambulatory Visit: Payer: Self-pay

## 2018-09-13 ENCOUNTER — Ambulatory Visit (INDEPENDENT_AMBULATORY_CARE_PROVIDER_SITE_OTHER): Payer: No Typology Code available for payment source | Admitting: Psychiatry

## 2018-09-13 DIAGNOSIS — F410 Panic disorder [episodic paroxysmal anxiety] without agoraphobia: Secondary | ICD-10-CM | POA: Diagnosis not present

## 2018-09-13 DIAGNOSIS — F411 Generalized anxiety disorder: Secondary | ICD-10-CM | POA: Diagnosis not present

## 2018-09-13 MED ORDER — ALPRAZOLAM 0.5 MG PO TABS: 0.5000 mg | ORAL_TABLET | Freq: Four times a day (QID) | ORAL | 0 refills | Status: DC | PRN

## 2018-09-13 NOTE — Progress Notes (Signed)
BH MD/PA/NP OP Progress Note  09/13/2018 11:11 AM Jennifer Salazar  MRN:  [id number] Interview was conducted by phone and I verified that I was speaking with the correct person using two identifiers. I discussed the limitations of evaluation and management by telemedicine and  the availability of in person appointments. Patient expressed understanding and agreed to proceed.  Chief Complaint: Abdominal pain, anxiety.  HPI: 38 yo married female with generalized and panic type anxiety. She still has episodic panic attacks and excessive worrying (about multiple physical ailments/complaints -vertigo,headaches, heartburn). No depression, no SI. Sleep improves when she take alprazolam. Tolerates increase dose of Zoloft(100 mg) well but remains anxious while trying to go down on alprazolam (0.5 mg now, previously 1 mg, tid per panic attacks).  She has hemorrhaging ovarian cyst and has been in pain for the past week. She cannot get to OB-GYN until next week and has been going to ED to get Percocet (for few days only). This causes frustration, anxiety and interferes with sleep. She is unable to go to work at [employer] at this time. Her us of alprazolam temporarily increased.    Visit Diagnosis:    ICD-10-CM   1. Panic disorder  F41.0   2. Generalized anxiety disorder  F41.1     Past Psychiatric History: Please see intake H&P.  Past Medical History:  Past Medical History:  Diagnosis Date  . Anxiety   . Hypertension     Past Surgical History:  Procedure Laterality Date  . WISDOM TOOTH EXTRACTION      Family Psychiatric History: Reviewed.  Family History:  Family History  Problem Relation Age of Onset  . Hypertension Mother   . COPD Father   . Crohn's disease Father   . Anxiety disorder Father   . Drug abuse Sister   . Anxiety disorder Brother     Social History:  Social History   Socioeconomic History  . Marital status: Married    Spouse name: Otilio Sabertravis smith  . Number of  children: 0  . Years of education: Not on file  . Highest education level: Some college, no degree  Occupational History  . Not on file  Social Needs  . Financial resource strain: Not hard at all  . Food insecurity    Worry: Never true    Inability: Never true  . Transportation needs    Medical: No    Non-medical: No  Tobacco Use  . Smoking status: Former Smoker    Packs/day: 0.25    Years: 10.00    Pack years: 2.50    Types: Cigarettes    Quit date: 11/08/2017    Years since quitting: 0.8  . Smokeless tobacco: Never Used  Substance and Sexual Activity  . Alcohol use: Not Currently    Frequency: Never  . Drug use: Never  . Sexual activity: Yes    Birth control/protection: I.U.D.  Lifestyle  . Physical activity    Days per week: 0 days    Minutes per session: 0 min  . Stress: Very much  Relationships  . Social Musicianconnections    Talks on phone: Not on file    Gets together: Not on file    Attends religious service: Never    Active member of club or organization: No    Attends meetings of clubs or organizations: Never    Relationship status: Married  Other Topics Concern  . Not on file  Social History Narrative  . Not on file    Allergies:  Allergies  Allergen Reactions  . Benadryl [Diphenhydramine] Other (See Comments)    Internal restlessness  . Reglan [Metoclopramide]     Metabolic Disorder Labs: No results found for: HGBA1C, MPG No results found for: PROLACTIN Lab Results  Component Value Date   CHOL 191 03/22/2018   TRIG 92 03/22/2018   HDL 46 03/22/2018   CHOLHDL 4.2 03/22/2018   LDLCALC 127 (H) 03/22/2018   Lab Results  Component Value Date   TSH 1.700 07/08/2018   TSH 2.730 03/22/2018    Therapeutic Level Labs: No results found for: LITHIUM No results found for: VALPROATE No components found for:  CBMZ  Current Medications: Current Outpatient Medications  Medication Sig Dispense Refill  . ALPRAZolam (XANAX) 0.5 MG tablet Take 1 tablet  (0.5 mg total) by mouth every 6 (six) hours as needed for anxiety. 120 tablet 0  . butalbital-acetaminophen-caffeine (FIORICET, ESGIC) 50-325-40 MG tablet Take 1 tablet by mouth as needed for migraine.    . calcium carbonate (TUMS - DOSED IN MG ELEMENTAL CALCIUM) 500 MG chewable tablet Chew 1 tablet by mouth 2 (two) times daily as needed for indigestion or heartburn.    . cyclobenzaprine (FLEXERIL) 10 MG tablet Take 1 tablet (10 mg total) by mouth 2 (two) times daily as needed for muscle spasms. (Patient not taking: Reported on 09/10/2018) 20 tablet 0  . HYDROcodone-acetaminophen (NORCO/VICODIN) 5-325 MG tablet Take 1-2 tablets by mouth every 6 (six) hours as needed. (Patient not taking: Reported on 09/10/2018) 6 tablet 0  . levonorgestrel (MIRENA) 20 MCG/24HR IUD by Intrauterine route.    . Lidocaine-Glycerin (PREPARATION H EX) Apply 1 application topically daily as needed (hemorrhoids).    Marland Kitchen lisinopril (ZESTRIL) 10 MG tablet Take 10 mg by mouth daily.    . meclizine (ANTIVERT) 25 MG tablet Take 1 tablet (25 mg total) by mouth 3 (three) times daily as needed for dizziness. 30 tablet 0  . naproxen (NAPROSYN) 500 MG tablet Take 1 tablet (500 mg total) by mouth 2 (two) times daily. (Patient not taking: Reported on 09/07/2018) 10 tablet 0  . omeprazole (PRILOSEC) 40 MG capsule Take 40 mg by mouth daily as needed (heartburn).    Marland Kitchen oxyCODONE-acetaminophen (PERCOCET) 5-325 MG tablet Take 1 tablet by mouth every 4 (four) hours as needed. 10 tablet 0  . sertraline (ZOLOFT) 100 MG tablet Take 1 tablet (100 mg total) by mouth daily. 90 tablet 0  . sucralfate (CARAFATE) 1 g tablet TAKE 1 TABLET BY MOUTH FOUR TIMES DAILY WITH MEALS AND AT BEDTIME FOR 14 DAYS (Patient not taking: Reported on 09/05/2018) 360 tablet 1  . triamcinolone cream (KENALOG) 0.1 % Apply 1 application topically 2 (two) times daily.      No current facility-administered medications for this visit.      Psychiatric Specialty Exam: Review of  Systems  Gastrointestinal: Positive for abdominal pain.  Psychiatric/Behavioral: The patient is nervous/anxious and has insomnia.   All other systems reviewed and are negative.   There were no vitals taken for this visit.There is no height or weight on file to calculate BMI.  General Appearance: NA  Eye Contact:  NA  Speech:  Clear and Coherent and Normal Rate  Volume:  Normal  Mood:  Anxious  Affect:  NA  Thought Process:  Goal Directed  Orientation:  Full (Time, Place, and Person)  Thought Content: Logical   Suicidal Thoughts:  No  Homicidal Thoughts:  No  Memory:  Immediate;   Good Recent;   Good Remote;  Good  Judgement:  Good  Insight:  Good  Psychomotor Activity:  NA  Concentration:  Concentration: Fair  Recall:  Good  Fund of Knowledge: Good  Language: Good  Akathisia:  Negative  Handed:  Right  AIMS (if indicated): not done  Assets:  Communication Skills Desire for Improvement Financial Resources/Insurance Housing Resilience Social Support Talents/Skills  ADL's:  Intact  Cognition: WNL  Sleep:  Fair   Screenings: GAD-7     [facility]  Total GAD-7 Score  11  7  11  11     PHQ2-9     [facility]  PHQ-2 Total Score  0  3  1  0  2  PHQ-9 Total Score  6  12  8   -  8       Assessment and Plan: 38 yo married female with generalized and panic type anxiety. She still has episodic panic attacks and excessive worrying (about multiple physical ailments/complaints -vertigo,headaches, heartburn). No depression, no  SI. Sleep improves when she take alprazolam. Tolerates increase dose of Zoloft(100 mg) well but remains anxious while trying to go down on alprazolam (0.5 mg now, previously 1 mg, tid per panic attacks).  She has hemorrhaging ovarian cyst and has been in pain for the past week. She cannot get to OB-GYN until next week and has been going to ED to get Percocet (for few days only). This causes frustration, anxiety and interferes with sleep. She is unable to go to work at [employer] at this time. Her us of alprazolam temporarily increased.  Plan: Continue alprazolam 0.5 mg prn anxiety/sleep q 6 hr, perhaps we will be able to decrease frequency once her cyst is taken care of.  so she has some room to maneuver. Continue  Zoloft to100 mg daily. The plan was discussed with patient who had an opportunity to ask questions and these were all answered. I spend 25 minutes on the phone with the patient. Next visit in one month.   Magdalene Patricialgierd A Thinh Cuccaro, MD 09/13/2018, 11:11 AM

## 2018-09-19 ENCOUNTER — Ambulatory Visit (INDEPENDENT_AMBULATORY_CARE_PROVIDER_SITE_OTHER): Payer: No Typology Code available for payment source | Admitting: Family Medicine

## 2018-09-19 ENCOUNTER — Encounter: Payer: Self-pay | Admitting: Family Medicine

## 2018-09-19 ENCOUNTER — Other Ambulatory Visit: Payer: Self-pay

## 2018-09-19 VITALS — BP 137/97 | HR 86 | Temp 98.7°F | Wt 193.0 lb

## 2018-09-19 DIAGNOSIS — N83209 Unspecified ovarian cyst, unspecified side: Secondary | ICD-10-CM | POA: Diagnosis not present

## 2018-09-19 MED ORDER — NORETHINDRONE ACETATE 5 MG PO TABS: 5.0000 mg | ORAL_TABLET | Freq: Every day | ORAL | 1 refills | Status: DC

## 2018-09-19 NOTE — Progress Notes (Signed)
   Subjective:    Patient ID: Jennifer Salazar is a 38 y.o. female presenting with No chief complaint on file.  on 09/19/2018  HPI: Reports long h/o ovarian cysts, which improved when she got her Mirena. IUD in x 3.5 years. Then had a subsequent cyst again. She is still having pain. Notes pain is always same. Cyst is always on right and always hemorrhagic. Has long h/o infertility. Could she have endometriosis. She is unclear about future fertility. No cycles right now with her Mirena. Has not had cycles. Has been on OCs, patch, NuvaRing.  Review of Systems  Constitutional: Negative for chills and fever.  Respiratory: Negative for shortness of breath.   Cardiovascular: Negative for chest pain.  Gastrointestinal: Negative for abdominal pain, nausea and vomiting.  Genitourinary: Negative for dysuria.  Skin: Negative for rash.      Objective:    There were no vitals taken for this visit. Physical Exam Constitutional:      General: She is not in acute distress.    Appearance: She is well-developed.  HENT:     Head: Normocephalic and atraumatic.  Eyes:     General: No scleral icterus. Neck:     Musculoskeletal: Neck supple.  Cardiovascular:     Rate and Rhythm: Normal rate.  Pulmonary:     Effort: Pulmonary effort is normal.  Abdominal:     Palpations: Abdomen is soft.  Skin:    General: Skin is warm and dry.  Neurological:     Mental Status: She is alert and oriented to person, place, and time.         Assessment & Plan:   Problem List Items Addressed This Visit      Unprioritized   Hemorrhagic cyst of ovary - Primary    ? Endometriosis, and as such will add Aygestin for pain. Continue IUD. We will repeat her u/s in 6-12 wks, if still there, consider laparoscopy for diagnosis vs. Other. If wants to pursue fertility, would need HSG, IUD removal and possible REI referral.       Relevant Medications   norethindrone (AYGESTIN) 5 MG tablet   Other Relevant Orders   US  PELVIS TRANSVAGINAL NON-OB (TV ONLY)      Total face-to-face time with patient: 20 minutes. Over 50% of encounter was spent on counseling and coordination of care. Return in about 2 months (around 11/19/2018).  Donnamae Jude 09/19/2018 10:50 AM

## 2018-09-19 NOTE — Patient Instructions (Signed)
Ovarian Cyst An ovarian cyst is a fluid-filled sac on an ovary. The ovaries are organs that make eggs in women. Most ovarian cysts go away on their own and are not cancerous (are benign). Some cysts need treatment. Follow these instructions at home:  Take over-the-counter and prescription medicines only as told by your doctor.  Do not drive or use heavy machinery while taking prescription pain medicine.  Get pelvic exams and Pap tests as often as told by your doctor.  Return to your normal activities as told by your doctor. Ask your doctor what activities are safe for you.  Do not use any products that contain nicotine or tobacco, such as cigarettes and e-cigarettes. If you need help quitting, ask your doctor.  Keep all follow-up visits as told by your doctor. This is important. Contact a doctor if:  Your periods are: ? Late. ? Irregular. ? Painful.   Your periods stop.  You have pelvic pain that does not go away.  You have pressure on your bladder.  You have trouble making your bladder empty when you pee (urinate).  You have pain during sex.  You have any of the following in your belly (abdomen): ? A feeling of fullness. ? Pressure. ? Discomfort. ? Pain that does not go away. ? Swelling.  You feel sick most of the time.  You have trouble pooping (have constipation).  You are not as hungry as usual (you lose your appetite).  You get very bad acne.  You start to have more hair on your body and face.  You are gaining weight or losing weight without changing your exercise and eating habits.  You think you may be pregnant. Get help right away if:  You have belly pain that is very bad or gets worse.  You cannot eat or drink without throwing up (vomiting).  You suddenly get a fever.  Your period is a lot heavier than usual. This information is not intended to replace advice given to you by your health care provider. Make sure you discuss any questions you have  with your health care provider. Document Released: 07/22/2007 Document Revised: 01/15/2017 Document Reviewed: 07/07/2015 Elsevier Patient Education  2020 Elsevier Inc.  

## 2018-09-19 NOTE — Assessment & Plan Note (Addendum)
?   Endometriosis, and as such will add Aygestin for pain. Continue IUD. We will repeat her u/s in 6-12 wks, if still there, consider laparoscopy for diagnosis vs. Other. If wants to pursue fertility, would need HSG, IUD removal and possible REI referral.

## 2018-09-28 ENCOUNTER — Other Ambulatory Visit: Payer: Self-pay | Admitting: Family Medicine

## 2018-09-28 NOTE — Telephone Encounter (Signed)
Requested medications are due for refill today?  No  Requested medications are on the active medication list?  No  Last refill n/a  Future visit scheduled?  Yes, 10/28/2018  Notes to clinic- Lisinopril 10 mg listed on med list as historical medication.  Refill request is for Lisinopril 20 mg- not on patient medication.  Last note from Dr. Pamella Pert 08/06/2018 states patient was normotensive off of BP medication.   Requested Prescriptions  Pending Prescriptions Disp Refills   lisinopril (ZESTRIL) 20 MG tablet [Pharmacy Med Name: LISINOPRIL 20 MG TABLET] 180 tablet 1    Sig: TAKE 1 TABLET BY MOUTH TWICE A DAY     Cardiovascular:  ACE Inhibitors Failed - 09/28/2018  2:33 PM      Failed - Last BP in normal range    BP Readings from Last 1 Encounters:  09/19/18 (!) 137/97         Passed - Cr in normal range and within 180 days    Creatinine, Ser  Date Value Ref Range Status  09/10/2018 0.81 0.44 - 1.00 mg/dL Final         Passed - K in normal range and within 180 days    Potassium  Date Value Ref Range Status  09/10/2018 4.3 3.5 - 5.1 mmol/L Final         Passed - Patient is not pregnant      Passed - Valid encounter within last 6 months    Recent Outpatient Visits          3 weeks ago Hemorrhagic cyst of ovary   Primary Care at [facility], Rex Kras, MD   2 months ago Dizziness   [facility], Lilia Argue, MD   2 months ago Dizziness   [facility] at [facility], Ranell Patrick, MD   5 months ago Gastroesophageal reflux disease, esophagitis presence not specified   [facility], Lilia Argue, MD   6 months ago Essential hypertension, benign   [facility] at [facility], Lilia Argue, MD      Future Appointments            In 1 month Rutherford Guys, MD Primary Care at [place], [facility]

## 2018-10-03 ENCOUNTER — Other Ambulatory Visit (HOSPITAL_COMMUNITY): Payer: Self-pay

## 2018-10-03 MED ORDER — ALPRAZOLAM 0.5 MG PO TABS: 0.5000 mg | ORAL_TABLET | Freq: Four times a day (QID) | ORAL | 0 refills | Status: DC | PRN

## 2018-10-09 ENCOUNTER — Emergency Department (HOSPITAL_COMMUNITY)
Admission: EM | Admit: 2018-10-09 | Discharge: 2018-10-09 | Disposition: A | Discharge status: Home / Self Care | Payer: No Typology Code available for payment source | Attending: Emergency Medicine | Admitting: Emergency Medicine

## 2018-10-09 ENCOUNTER — Encounter (HOSPITAL_COMMUNITY): Payer: Self-pay

## 2018-10-09 ENCOUNTER — Other Ambulatory Visit: Payer: Self-pay

## 2018-10-09 ENCOUNTER — Emergency Department (HOSPITAL_COMMUNITY): Payer: No Typology Code available for payment source

## 2018-10-09 DIAGNOSIS — Z87891 Personal history of nicotine dependence: Secondary | ICD-10-CM | POA: Diagnosis not present

## 2018-10-09 DIAGNOSIS — Z79899 Other long term (current) drug therapy: Secondary | ICD-10-CM | POA: Diagnosis not present

## 2018-10-09 DIAGNOSIS — N83201 Unspecified ovarian cyst, right side: Secondary | ICD-10-CM | POA: Diagnosis not present

## 2018-10-09 DIAGNOSIS — N83209 Unspecified ovarian cyst, unspecified side: Secondary | ICD-10-CM

## 2018-10-09 DIAGNOSIS — N898 Other specified noninflammatory disorders of vagina: Secondary | ICD-10-CM | POA: Diagnosis not present

## 2018-10-09 DIAGNOSIS — I10 Essential (primary) hypertension: Secondary | ICD-10-CM | POA: Insufficient documentation

## 2018-10-09 DIAGNOSIS — R1031 Right lower quadrant pain: Secondary | ICD-10-CM | POA: Diagnosis present

## 2018-10-09 DIAGNOSIS — R1030 Lower abdominal pain, unspecified: Secondary | ICD-10-CM | POA: Diagnosis not present

## 2018-10-09 DIAGNOSIS — R52 Pain, unspecified: Secondary | ICD-10-CM

## 2018-10-09 HISTORY — DX: Unspecified ovarian cyst, unspecified side: N83.209

## 2018-10-09 LAB — CBC WITH DIFFERENTIAL/PLATELET
Abs Immature Granulocytes: 0.01 10*3/uL (ref 0.00–0.07)
Basophils Absolute: 0 10*3/uL (ref 0.0–0.1)
Basophils Relative: 1 %
Eosinophils Absolute: 0.2 10*3/uL (ref 0.0–0.5)
Eosinophils Relative: 4 %
HCT: 43.3 % (ref 36.0–46.0)
Hemoglobin: 14.3 g/dL (ref 12.0–15.0)
Immature Granulocytes: 0 %
Lymphocytes Relative: 27 %
Lymphs Abs: 1.5 10*3/uL (ref 0.7–4.0)
MCH: 31.6 pg (ref 26.0–34.0)
MCHC: 33 g/dL (ref 30.0–36.0)
MCV: 95.6 fL (ref 80.0–100.0)
Monocytes Absolute: 0.4 10*3/uL (ref 0.1–1.0)
Monocytes Relative: 7 %
Neutro Abs: 3.4 10*3/uL (ref 1.7–7.7)
Neutrophils Relative %: 61 %
Platelets: 230 10*3/uL (ref 150–400)
RBC: 4.53 MIL/uL (ref 3.87–5.11)
RDW: 12.2 % (ref 11.5–15.5)
WBC: 5.6 10*3/uL (ref 4.0–10.5)
nRBC: 0 % (ref 0.0–0.2)

## 2018-10-09 LAB — URINALYSIS, ROUTINE W REFLEX MICROSCOPIC
Bacteria, UA: NONE SEEN
Bilirubin Urine: NEGATIVE
Glucose, UA: NEGATIVE mg/dL
Ketones, ur: NEGATIVE mg/dL
Leukocytes,Ua: NEGATIVE
Nitrite: NEGATIVE
Protein, ur: NEGATIVE mg/dL
Specific Gravity, Urine: 1.013 (ref 1.005–1.030)
pH: 5 (ref 5.0–8.0)

## 2018-10-09 LAB — BASIC METABOLIC PANEL
Anion gap: 8 (ref 5–15)
BUN: 11 mg/dL (ref 6–20)
CO2: 24 mmol/L (ref 22–32)
Calcium: 8.9 mg/dL (ref 8.9–10.3)
Chloride: 106 mmol/L (ref 98–111)
Creatinine, Ser: 0.88 mg/dL (ref 0.44–1.00)
GFR calc Af Amer: >60 mL/min (ref >60)
GFR calc non Af Amer: >60 mL/min (ref >60)
Glucose, Bld: 112 mg/dL — ABNORMAL HIGH (ref 70–99)
Potassium: 3.8 mmol/L (ref 3.5–5.1)
Sodium: 138 mmol/L (ref 135–145)

## 2018-10-09 LAB — POC URINE PREG, ED: Preg Test, Ur: NEGATIVE

## 2018-10-09 MED ORDER — KETOROLAC TROMETHAMINE 15 MG/ML IJ SOLN
15.0000 mg | Freq: Once | INTRAMUSCULAR | Status: AC
  Administered 2018-10-09: 15 mg via INTRAMUSCULAR
  Filled 2018-10-09: qty 1

## 2018-10-09 MED ORDER — ONDANSETRON HCL 4 MG/2ML IJ SOLN
4.0000 mg | Freq: Once | INTRAMUSCULAR | Status: AC
  Administered 2018-10-09: 4 mg via INTRAVENOUS
  Filled 2018-10-09: qty 2

## 2018-10-09 MED ORDER — MORPHINE SULFATE (PF) 2 MG/ML IV SOLN
2.0000 mg | Freq: Once | INTRAVENOUS | Status: AC
  Administered 2018-10-09: 2 mg via INTRAVENOUS
  Filled 2018-10-09: qty 1

## 2018-10-09 NOTE — Discharge Instructions (Signed)
Please follow-up with an OB/GYN for continued evaluation within the next week.  You may also follow-up with your primary care provider within the next 2 to 5 days for continued evaluation.  Take Tylenol as needed for pain.  Return to the ED immediately for new or worsening symptoms or concerns, such as fevers, new or worsening abdominal pain, burning when you pee, vaginal bleeding or any concerns at all.

## 2018-10-09 NOTE — ED Provider Notes (Signed)
[facility]-EMERGENCY DEPT Provider Note   CSN: [id number] Arrival date & time: 10/09/18  1052     History   Chief Complaint Chief Complaint  Patient presents with   Ovarian Cyst    HPI Jennifer Salazar is a 38 y.o. female.      HPI   38 year old female presents with a one-month history of right lower quadrant pain.  Patient states a history of an ovarian cyst in that area.  She has been seen by OB/GYN for this who told her they were going to do watchful waiting.  She has had multiple ultrasounds.  She states today symptoms significantly worsened and are crampy and sharp pain.  She notes vaginal discharge which she has had since getting her IUD in place.  She denies any increased vaginal discharge or foul odor.  She denies any vaginal bleeding.  She denies any dysuria, urinary urgency, urinary frequency.  She denies any vomiting, fevers.  Past Medical History:  Diagnosis Date   Anxiety    Hypertension    Ovarian cyst     Patient Active Problem List   Diagnosis Date Noted   Hemorrhagic cyst of ovary 09/07/2018   Panic disorder 04/26/2018   Generalized anxiety disorder 03/26/2018   Bladder problem 11/06/2016   Hypertensive disorder 11/06/2016   Gastroesophageal reflux disease without esophagitis 11/06/2016    Past Surgical History:  Procedure Laterality Date   WISDOM TOOTH EXTRACTION       OB History    Gravida  0   Para  0   Term  0   Preterm  0   AB  0   Living  0     SAB  0   TAB  0   Ectopic  0   Multiple  0   Live Births  0            Home Medications    Prior to Admission medications   Medication Sig Start Date End Date Taking? Authorizing Provider  ALPRAZolam Prudy Feeler(XANAX) 0.5 MG tablet Take 1 tablet (0.5 mg total) by mouth every 6 (six) hours as needed for anxiety. 10/03/18  Yes Pucilowski, Olgierd A, MD  butalbital-acetaminophen-caffeine (FIORICET, ESGIC) 50-325-40 MG tablet Take 1 tablet by mouth as  needed for migraine. 08/24/17  Yes [provider]  calcium carbonate (TUMS - DOSED IN MG ELEMENTAL CALCIUM) 500 MG chewable tablet Chew 1 tablet by mouth 2 (two) times daily as needed for indigestion or heartburn.   Yes [provider]  diazepam (VALIUM) 10 MG tablet Take 10 mg by mouth at bedtime as needed for anxiety. vaginally   Yes [provider]  levonorgestrel (MIRENA) 20 MCG/24HR IUD by Intrauterine route.   Yes [provider]  Lidocaine-Glycerin (PREPARATION H EX) Apply 1 application topically daily as needed (hemorrhoids).   Yes [provider]  lisinopril (ZESTRIL) 10 MG tablet Take 10 mg by mouth daily.   Yes [provider]  meclizine (ANTIVERT) 25 MG tablet Take 1 tablet (25 mg total) by mouth 3 (three) times daily as needed for dizziness. 07/02/18  Yes Loren RacerYelverton, David, MD  norethindrone (AYGESTIN) 5 MG tablet Take 1 tablet (5 mg total) by mouth daily. 09/19/18  Yes Reva BoresPratt, Tanya S, MD  omeprazole (PRILOSEC) 40 MG capsule Take 40 mg by mouth daily as needed (heartburn).   Yes [provider]  sertraline (ZOLOFT) 100 MG tablet Take 1 tablet (100 mg total) by mouth daily. 08/09/18 11/07/18 Yes Pucilowski, Olgierd A,  MD  tamsulosin (FLOMAX) 0.4 MG CAPS capsule Take 0.4 mg by mouth daily. 09/15/18  Yes [provider]  triamcinolone cream (KENALOG) 0.1 % Apply 1 application topically 2 (two) times daily as needed (rash on hands).  01/07/18  Yes [provider]  cyclobenzaprine (FLEXERIL) 10 MG tablet Take 1 tablet (10 mg total) by mouth 2 (two) times daily as needed for muscle spasms. Patient not taking: Reported on 09/10/2018 08/12/18   Dahlia ByesBast, Traci A, NP  HYDROcodone-acetaminophen (NORCO/VICODIN) 5-325 MG tablet Take 1-2 tablets by mouth every 6 (six) hours as needed. Patient not taking: Reported on 09/10/2018 09/05/18   Petrucelli, Samantha R, PA-C  lisinopril (ZESTRIL) 20 MG tablet TAKE 1 TABLET BY MOUTH TWICE A  DAY Patient not taking: Reported on 10/09/2018 09/29/18   Myles LippsSantiago, Irma M, MD  naproxen (NAPROSYN) 500 MG tablet Take 1 tablet (500 mg total) by mouth 2 (two) times daily. Patient not taking: Reported on 09/07/2018 09/05/18   Petrucelli, Pleas KochSamantha R, PA-C  oxyCODONE-acetaminophen (PERCOCET) 5-325 MG tablet Take 1 tablet by mouth every 4 (four) hours as needed. Patient not taking: Reported on 09/19/2018 09/10/18   Charlestine NightLawyer, Christopher, PA-C  sucralfate (CARAFATE) 1 g tablet TAKE 1 TABLET BY MOUTH FOUR TIMES DAILY WITH MEALS AND AT BEDTIME FOR 14 DAYS Patient not taking: Reported on 09/05/2018 06/17/18   Tressia DanasBeavers, Kimberly, MD  pantoprazole (PROTONIX) 40 MG tablet Take 1 tablet (40 mg total) by mouth daily. Patient not taking: Reported on 09/05/2018 06/16/18 09/05/18  Tressia DanasBeavers, Kimberly, MD  potassium chloride SA (K-DUR) 20 MEQ tablet Take 1 tablet (20 mEq total) by mouth daily. Patient not taking: Reported on 09/05/2018 07/03/18 09/05/18  Loren RacerYelverton, David, MD    Family History Family History  Problem Relation Age of Onset   Hypertension Mother    COPD Father    Crohn's disease Father    Anxiety disorder Father    Drug abuse Sister    Anxiety disorder Brother     Social History Social History   Tobacco Use   Smoking status: Former Smoker    Packs/day: 0.25    Years: 10.00    Pack years: 2.50    Types: Cigarettes    Quit date: 11/08/2017    Years since quitting: 0.9   Smokeless tobacco: Never Used  Substance Use Topics   Alcohol use: Not Currently    Frequency: Never   Drug use: Never     Allergies   Benadryl [diphenhydramine] and Reglan [metoclopramide]   Review of Systems Review of Systems  Constitutional: Negative for chills and fever.  Respiratory: Negative for shortness of breath.   Cardiovascular: Negative for chest pain.  Gastrointestinal: Positive for abdominal pain. Negative for nausea and vomiting.  Genitourinary: Positive for vaginal discharge. Negative for  dysuria, urgency and vaginal bleeding.     Physical Exam Updated Vital Signs BP (!) 136/107 (BP Location: Right Arm)    Pulse 95    Temp 98.8 F (37.1 C) (Oral)    Resp 15    Ht 5\' 2"  (1.575 m)    Wt 86.2 kg    LMP  (LMP Unknown)    SpO2 97%    BMI 34.75 kg/m   Physical Exam Vitals signs and nursing note reviewed.  Constitutional:      Appearance: She is well-developed.  HENT:     Head: Normocephalic and atraumatic.  Eyes:     Conjunctiva/sclera: Conjunctivae normal.  Neck:     Musculoskeletal: Neck supple.  Cardiovascular:  Rate and Rhythm: Normal rate and regular rhythm.     Heart sounds: Normal heart sounds. No murmur.  Pulmonary:     Effort: Pulmonary effort is normal. No respiratory distress.     Breath sounds: Normal breath sounds. No wheezing or rales.  Abdominal:     General: Bowel sounds are normal. There is no distension.     Palpations: Abdomen is soft.     Tenderness: There is no abdominal tenderness. There is no guarding or rebound. Negative signs include Rovsing's sign, McBurney's sign and psoas sign.    Musculoskeletal: Normal range of motion.        General: No tenderness or deformity.  Skin:    General: Skin is warm and dry.     Findings: No erythema or rash.  Neurological:     Mental Status: She is alert and oriented to person, place, and time.  Psychiatric:        Behavior: Behavior normal.      ED Treatments / Results  Labs (all labs ordered are listed, but only abnormal results are displayed) Labs Reviewed  BASIC METABOLIC PANEL - Abnormal; Notable for the following components:      Result Value   Glucose, Bld 112 (*)    All other components within normal limits  URINALYSIS, ROUTINE W REFLEX MICROSCOPIC - Abnormal; Notable for the following components:   Hgb urine dipstick MODERATE (*)    All other components within normal limits  CBC WITH DIFFERENTIAL/PLATELET  POC URINE PREG, ED    EKG None  Radiology US Pelvis Transvaginal  Non-ob (tv Only)  Result Date: 10/09/2018 CLINICAL DATA:  Right pelvic pain for 1 month EXAM: TRANSABDOMINAL AND TRANSVAGINAL ULTRASOUND OF PELVIS DOPPLER ULTRASOUND OF OVARIES TECHNIQUE: Both transabdominal and transvaginal ultrasound examinations of the pelvis were performed. Transabdominal technique was performed for global imaging of the pelvis including uterus, ovaries, adnexal regions, and pelvic cul-de-sac. It was necessary to proceed with endovaginal exam following the transabdominal exam to visualize the uterus, endometrium, ovaries, and adnexa. Color and duplex Doppler ultrasound was utilized to evaluate blood flow to the ovaries. COMPARISON:  09/10/2018 FINDINGS: Uterus Measurements: 5.5 x 3.4 x 4.8 cm = volume: 47 mL. No fibroids or other mass visualized. Endometrium Thickness: 6 mm.  IUD is present in the endometrial cavity. Right ovary Measurements: 2.8 x 2.7 x 2.4 cm = volume: 9 mL. There are multiple small follicles and the irregular residual of a previously identified hemorrhagic right ovarian cyst. Left ovary Measurements: 2.2 x 1.1 x 1.7 cm = volume: 2 mL. Normal appearance/no adnexal mass. Pulsed Doppler evaluation of both ovaries demonstrates normal low-resistance arterial and venous waveforms. Other findings No abnormal free fluid. IMPRESSION: 1. No ultrasound abnormality of the pelvis to explain right-sided pelvic pain. 2. Arterial and venous Doppler flow is identified to the bilateral ovaries. There are multiple small right ovarian follicles and the irregular residual of a previously identified hemorrhagic right ovarian cyst. No further routine follow-up is required for this resolving hemorrhagic cyst. 2.  IUD is present in the endometrial cavity. Electronically Signed   By: Lauralyn Primes M.D.   On: 10/09/2018 13:15   US Pelvic Doppler (torsion R/o Or Mass Arterial Flow)  Result Date: 10/09/2018 CLINICAL DATA:  Right pelvic pain for 1 month EXAM: TRANSABDOMINAL AND TRANSVAGINAL  ULTRASOUND OF PELVIS DOPPLER ULTRASOUND OF OVARIES TECHNIQUE: Both transabdominal and transvaginal ultrasound examinations of the pelvis were performed. Transabdominal technique was performed for global imaging of the pelvis including uterus,  ovaries, adnexal regions, and pelvic cul-de-sac. It was necessary to proceed with endovaginal exam following the transabdominal exam to visualize the uterus, endometrium, ovaries, and adnexa. Color and duplex Doppler ultrasound was utilized to evaluate blood flow to the ovaries. COMPARISON:  09/10/2018 FINDINGS: Uterus Measurements: 5.5 x 3.4 x 4.8 cm = volume: 47 mL. No fibroids or other mass visualized. Endometrium Thickness: 6 mm.  IUD is present in the endometrial cavity. Right ovary Measurements: 2.8 x 2.7 x 2.4 cm = volume: 9 mL. There are multiple small follicles and the irregular residual of a previously identified hemorrhagic right ovarian cyst. Left ovary Measurements: 2.2 x 1.1 x 1.7 cm = volume: 2 mL. Normal appearance/no adnexal mass. Pulsed Doppler evaluation of both ovaries demonstrates normal low-resistance arterial and venous waveforms. Other findings No abnormal free fluid. IMPRESSION: 1. No ultrasound abnormality of the pelvis to explain right-sided pelvic pain. 2. Arterial and venous Doppler flow is identified to the bilateral ovaries. There are multiple small right ovarian follicles and the irregular residual of a previously identified hemorrhagic right ovarian cyst. No further routine follow-up is required for this resolving hemorrhagic cyst. 2.  IUD is present in the endometrial cavity. Electronically Signed   By: Eddie Candle M.D.   On: 10/09/2018 13:15   US Pelvic Complete W Transvaginal And Torsion R/o  Result Date: 10/09/2018 CLINICAL DATA:  Right pelvic pain for 1 month EXAM: TRANSABDOMINAL AND TRANSVAGINAL ULTRASOUND OF PELVIS DOPPLER ULTRASOUND OF OVARIES TECHNIQUE: Both transabdominal and transvaginal ultrasound examinations of the pelvis were  performed. Transabdominal technique was performed for global imaging of the pelvis including uterus, ovaries, adnexal regions, and pelvic cul-de-sac. It was necessary to proceed with endovaginal exam following the transabdominal exam to visualize the uterus, endometrium, ovaries, and adnexa. Color and duplex Doppler ultrasound was utilized to evaluate blood flow to the ovaries. COMPARISON:  09/10/2018 FINDINGS: Uterus Measurements: 5.5 x 3.4 x 4.8 cm = volume: 47 mL. No fibroids or other mass visualized. Endometrium Thickness: 6 mm.  IUD is present in the endometrial cavity. Right ovary Measurements: 2.8 x 2.7 x 2.4 cm = volume: 9 mL. There are multiple small follicles and the irregular residual of a previously identified hemorrhagic right ovarian cyst. Left ovary Measurements: 2.2 x 1.1 x 1.7 cm = volume: 2 mL. Normal appearance/no adnexal mass. Pulsed Doppler evaluation of both ovaries demonstrates normal low-resistance arterial and venous waveforms. Other findings No abnormal free fluid. IMPRESSION: 1. No ultrasound abnormality of the pelvis to explain right-sided pelvic pain. 2. Arterial and venous Doppler flow is identified to the bilateral ovaries. There are multiple small right ovarian follicles and the irregular residual of a previously identified hemorrhagic right ovarian cyst. No further routine follow-up is required for this resolving hemorrhagic cyst. 2.  IUD is present in the endometrial cavity. Electronically Signed   By: Eddie Candle M.D.   On: 10/09/2018 13:15    Procedures Procedures (including critical care time)  Medications Ordered in ED Medications  ondansetron (ZOFRAN) injection 4 mg (4 mg Intravenous Given 10/09/18 1140)  morphine 2 MG/ML injection 2 mg (2 mg Intravenous Given 10/09/18 1140)     Initial Impression / Assessment and Plan / ED Course  I have reviewed the triage vital signs and the nursing notes.  Pertinent labs & imaging results that were available during my care of  the patient were reviewed by me and considered in my medical decision making (see chart for details).      Patient presents with right  lower quadrant abdominal pain, along the adnexal region.  She has a negative McBurney point tenderness, psoas, obturator, no guarding.  Her abdomen is soft.  Vital signs, initially tachycardic which has resolved without intervention.  She is afebrile.  This [facility] patient's [facility] complaint [facility]  She has had multiple ultrasounds, CT scan, blood work and urine.  Blood work with no acute abnormality.  Urine with no evidence of a UTI.  She had a repeat ultrasound today to rule out ovarian torsion.  Ultrasound shows resolving ovarian cyst.  Patient states she did not like the OB/GYN that she followed up with.  Encouraged follow-up with a different OB/GYN.  Offered further evaluation of patient symptoms with pelvic exam and patient declined.  Patient has had a negative CT scan, no indication for repeat CT today.  She is feeling much better and resting comfortably in bed.  Vital signs stable.  Discussed with patient and she is agreeable with foregoing a CT scan.  She was given return precautions.  She is ready and stable for discharge.   Upon discharge patient is agitated.  She is requesting narcotics for her abdominal pain.  This is patient's fifth visit for this symptom.  She has been prescribed narcotics numerous times.  Patient was informed she will not be getting a prescription for any narcotics.  She was offered a Toradol shot prior to discharge or no medication.  She reluctantly agreed to a Toradol shot but states "I know it will not work."  Patient instructed to follow-up with her OB/GYN again and given return precautions.  Final Clinical Impressions(s) / ED Diagnoses   Final diagnoses:  Pain    ED Discharge Orders    None       Rueben BashKendrick, Averil Digman S, PA-C 10/09/18 1721    Raeford RazorKohut, Stephen, MD 10/10/18 1153

## 2018-10-09 NOTE — ED Notes (Signed)
RN is waiting for ED Provider to inquire as to wether or not patient can be prescribed any pain medication.

## 2018-10-09 NOTE — ED Triage Notes (Signed)
States seen here for ovarian cyst on right side and went to OB/GYN and she states they said to wait and see and states today pain is severe and crying in triage.

## 2018-10-09 NOTE — ED Notes (Signed)
ED Provider at bedside. 

## 2018-10-12 ENCOUNTER — Ambulatory Visit (INDEPENDENT_AMBULATORY_CARE_PROVIDER_SITE_OTHER): Payer: No Typology Code available for payment source | Admitting: Psychiatry

## 2018-10-12 ENCOUNTER — Other Ambulatory Visit: Payer: Self-pay

## 2018-10-12 DIAGNOSIS — F411 Generalized anxiety disorder: Secondary | ICD-10-CM

## 2018-10-12 DIAGNOSIS — F410 Panic disorder [episodic paroxysmal anxiety] without agoraphobia: Secondary | ICD-10-CM

## 2018-10-12 MED ORDER — ALPRAZOLAM 0.5 MG PO TABS: 0.5000 mg | ORAL_TABLET | Freq: Four times a day (QID) | ORAL | 2 refills | Status: DC | PRN

## 2018-10-12 MED ORDER — SERTRALINE HCL 100 MG PO TABS: 100.0000 mg | ORAL_TABLET | Freq: Every day | ORAL | 0 refills | Status: DC

## 2018-10-12 NOTE — Progress Notes (Signed)
BH MD/PA/NP OP Progress Note  10/12/2018 11:17 AM Jennifer LoopSarah Salazar  MRN:  [id number] Interview was conducted by phone and I verified that I was speaking with the correct person using two identifiers. I discussed the limitations of evaluation and management by telemedicine and  the availability of in person appointments. Patient expressed understanding and agreed to proceed.  Chief Complaint: Anxiety.  HPI: 38 yo married female with generalized and panic type anxiety. She has episodic panic attacks and excessive worrying (about multiple physical ailments/complaints -abdominal pain, vertigo,headaches, heartburn). No depression, no SI. Sleep improves when she takes alprazolam. She is on Zoloft(100 mg)as well. In the past on 1 mg tid of alprazolam - now takes 0.5 mg prn up to 4 x daily. She has hemorrhaging ovarian cyst and has been in pain on an off. She has seen a new OB-GYN but no clear plan of action was apparently formulated which Jennifer Salazar finds frustrating. She is now back at work ([employer]) up to 3-4 shifts per week. She has seen urologist who prescribed "bladder exercises" and diazepam intravaginally. She is quite happy with this plan.  Visit Diagnosis:    ICD-10-CM   1. Generalized anxiety disorder  F41.1   2. Panic disorder  F41.0     Past Psychiatric History: Please see intake H&P.  Past Medical History:  Past Medical History:  Diagnosis Date  . Anxiety   . Hypertension   . Ovarian cyst     Past Surgical History:  Procedure Laterality Date  . WISDOM TOOTH EXTRACTION      Family Psychiatric History: Reviewed.  Family History:  Family History  Problem Relation Age of Onset  . Hypertension Mother   . COPD Father   . Crohn's disease Father   . Anxiety disorder Father   . Drug abuse Sister   . Anxiety disorder Brother     Social History:  Social History   Socioeconomic History  . Marital status: Married    Spouse name: Otilio Sabertravis smith  . Number of children: 0  .  Years of education: Not on file  . Highest education level: Some college, no degree  Occupational History  . Not on file  Social Needs  . Financial resource strain: Not hard at all  . Food insecurity    Worry: Never true    Inability: Never true  . Transportation needs    Medical: No    Non-medical: No  Tobacco Use  . Smoking status: Former Smoker    Packs/day: 0.25    Years: 10.00    Pack years: 2.50    Types: Cigarettes    Quit date: 11/08/2017    Years since quitting: 0.9  . Smokeless tobacco: Never Used  Substance and Sexual Activity  . Alcohol use: Not Currently    Frequency: Never  . Drug use: Never  . Sexual activity: Yes    Birth control/protection: I.U.D.  Lifestyle  . Physical activity    Days per week: 0 days    Minutes per session: 0 min  . Stress: Very much  Relationships  . Social Musicianconnections    Talks on phone: Not on file    Gets together: Not on file    Attends religious service: Never    Active member of club or organization: No    Attends meetings of clubs or organizations: Never    Relationship status: Married  Other Topics Concern  . Not on file  Social History Narrative  . Not on file  Allergies:  Allergies  Allergen Reactions  . Benadryl [Diphenhydramine] Other (See Comments)    Internal restlessness  . Reglan [Metoclopramide]     Metabolic Disorder Labs: No results found for: HGBA1C, MPG No results found for: PROLACTIN Lab Results  Component Value Date   CHOL 191 03/22/2018   TRIG 92 03/22/2018   HDL 46 03/22/2018   CHOLHDL 4.2 03/22/2018   LDLCALC 127 (H) 03/22/2018   Lab Results  Component Value Date   TSH 1.700 07/08/2018   TSH 2.730 03/22/2018    Therapeutic Level Labs: No results found for: LITHIUM No results found for: VALPROATE No components found for:  CBMZ  Current Medications: Current Outpatient Medications  Medication Sig Dispense Refill  . ALPRAZolam (XANAX) 0.5 MG tablet Take 1 tablet (0.5 mg total) by  mouth every 6 (six) hours as needed for anxiety. 120 tablet 0  . butalbital-acetaminophen-caffeine (FIORICET, ESGIC) 50-325-40 MG tablet Take 1 tablet by mouth as needed for migraine.    . calcium carbonate (TUMS - DOSED IN MG ELEMENTAL CALCIUM) 500 MG chewable tablet Chew 1 tablet by mouth 2 (two) times daily as needed for indigestion or heartburn.    . cyclobenzaprine (FLEXERIL) 10 MG tablet Take 1 tablet (10 mg total) by mouth 2 (two) times daily as needed for muscle spasms. (Patient not taking: Reported on 09/10/2018) 20 tablet 0  . diazepam (VALIUM) 10 MG tablet Take 10 mg by mouth at bedtime as needed for anxiety. vaginally    . HYDROcodone-acetaminophen (NORCO/VICODIN) 5-325 MG tablet Take 1-2 tablets by mouth every 6 (six) hours as needed. (Patient not taking: Reported on 09/10/2018) 6 tablet 0  . levonorgestrel (MIRENA) 20 MCG/24HR IUD by Intrauterine route.    . Lidocaine-Glycerin (PREPARATION H EX) Apply 1 application topically daily as needed (hemorrhoids).    Marland Kitchen lisinopril (ZESTRIL) 10 MG tablet Take 10 mg by mouth daily.    Marland Kitchen lisinopril (ZESTRIL) 20 MG tablet TAKE 1 TABLET BY MOUTH TWICE A DAY (Patient not taking: Reported on 10/09/2018) 180 tablet 1  . meclizine (ANTIVERT) 25 MG tablet Take 1 tablet (25 mg total) by mouth 3 (three) times daily as needed for dizziness. 30 tablet 0  . naproxen (NAPROSYN) 500 MG tablet Take 1 tablet (500 mg total) by mouth 2 (two) times daily. (Patient not taking: Reported on 09/07/2018) 10 tablet 0  . norethindrone (AYGESTIN) 5 MG tablet Take 1 tablet (5 mg total) by mouth daily. 30 tablet 1  . omeprazole (PRILOSEC) 40 MG capsule Take 40 mg by mouth daily as needed (heartburn).    Marland Kitchen oxyCODONE-acetaminophen (PERCOCET) 5-325 MG tablet Take 1 tablet by mouth every 4 (four) hours as needed. (Patient not taking: Reported on 09/19/2018) 10 tablet 0  . sertraline (ZOLOFT) 100 MG tablet Take 1 tablet (100 mg total) by mouth daily. 90 tablet 0  . sucralfate (CARAFATE) 1  g tablet TAKE 1 TABLET BY MOUTH FOUR TIMES DAILY WITH MEALS AND AT BEDTIME FOR 14 DAYS (Patient not taking: Reported on 09/05/2018) 360 tablet 1  . tamsulosin (FLOMAX) 0.4 MG CAPS capsule Take 0.4 mg by mouth daily.    Marland Kitchen triamcinolone cream (KENALOG) 0.1 % Apply 1 application topically 2 (two) times daily as needed (rash on hands).      No current facility-administered medications for this visit.      Psychiatric Specialty Exam: Review of Systems  Gastrointestinal: Positive for abdominal pain.  Psychiatric/Behavioral: The patient is nervous/anxious.   All other systems reviewed and are negative.  There were no vitals taken for this visit.There is no height or weight on file to calculate BMI.  General Appearance: NA  Eye Contact:  NA  Speech:  Clear and Coherent and Normal Rate  Volume:  Normal  Mood:  Anxious  Affect:  NA  Thought Process:  Goal Directed and Linear  Orientation:  Full (Time, Place, and Person)  Thought Content: Logical   Suicidal Thoughts:  No  Homicidal Thoughts:  No  Memory:  Immediate;   Good Recent;   Good Remote;   Good  Judgement:  Good  Insight:  Good  Psychomotor Activity:  NA  Concentration:  Concentration: Good  Recall:  Good  Fund of Knowledge: Good  Language: Good  Akathisia:  Negative  Handed:  Right  AIMS (if indicated): not done  Assets:  Communication Skills Desire for Improvement Financial Resources/Insurance Housing Resilience Social Support Talents/Skills Transportation  ADL's:  Intact  Cognition: WNL  Sleep:  Good   Screenings: GAD-7     Office Visit from 07/08/2018 in [facility] at [place] from 04/29/2018 in [facility] at [place] from 03/28/2018 in [place] at [place] from 03/22/2018 in [place] at [facility]  Total GAD-7 Score  11  7  11  11     PHQ2-9     Office Visit from 07/28/2018 in [facility] at [place] from 07/08/2018 in [place] at [place] from  04/29/2018 in [place] at [place] from 03/28/2018 in [place] at [place] from 03/22/2018 in [place] at [place]  PHQ-2 Total Score  0  3  1  0  2  PHQ-9 Total Score  6  12  8   -  8       Assessment and Plan: 38 yo married female with generalized and panic type anxiety. She has episodic panic attacks and excessive worrying (about multiple physical ailments/complaints -abdominal pain, vertigo,headaches, heartburn). No depression, no SI. Sleep improves when she takes alprazolam. She is on Zoloft(100 mg)as well. In the past on 1 mg tid of alprazolam - now takes 0.5 mg prn up to 4 x daily. She has hemorrhaging ovarian cyst and has been in pain on an off. She has seen a new OB-GYN but no clear plan of action was apparently formulated which Jennifer Salazar finds frustrating. She is now back at work ([employer]) up to 3-4 shifts per week. She has seen urologist who prescribed "bladder exercises" and diazepam intravaginally. She is quite happy with this plan.  Plan: Continue alprazolam0.5mg prnanxiety/sleepq 6 hr and Zoloft to100 mg daily.The plan was discussed with patient who had an opportunity to ask questions and these were all answered. I spend25 minuteson the phonewith the patient.Next visit in early November - she will visit her mother in October.     Stephanie Acre, MD 10/12/2018, 11:17 AM

## 2018-10-27 ENCOUNTER — Telehealth (HOSPITAL_COMMUNITY): Payer: Self-pay

## 2018-10-27 ENCOUNTER — Other Ambulatory Visit (HOSPITAL_COMMUNITY): Payer: Self-pay | Admitting: Psychiatry

## 2018-10-27 MED ORDER — ALPRAZOLAM 0.5 MG PO TABS: 0.5000 mg | ORAL_TABLET | Freq: Four times a day (QID) | ORAL | 1 refills | Status: DC | PRN

## 2018-10-27 NOTE — Telephone Encounter (Signed)
I called back and spoke with [facility] on [address] regarding patient's Xanax 0.5mg  and they will only do 1 day early to be filled on 11/01/18. Also spoke with patient again. Could it be resent to [facility] on [address] to get it 5 days early instead? Please review and advise? Thank you.

## 2018-10-27 NOTE — Telephone Encounter (Signed)
I think she can have a refill within 5 days of her scheduled one - 10 days like last time was a little too soon. If she had an early refill last time no wonder that she is running out early again.

## 2018-10-27 NOTE — Telephone Encounter (Signed)
Patient called regarding her Alprazolam 0.5mg . I spoke with the pharmacy and it is scheduled to be filled on 9/16. Patient has had recent refills close to 10 days early and the pharmacy is hesitant to do another early refill but the patient is requesting one. Please review and advise. Thank you.

## 2018-10-27 NOTE — Telephone Encounter (Signed)
I did send it to [facility].

## 2018-10-28 ENCOUNTER — Ambulatory Visit: Payer: No Typology Code available for payment source | Admitting: Family Medicine

## 2018-10-30 ENCOUNTER — Other Ambulatory Visit: Payer: Self-pay

## 2018-10-30 ENCOUNTER — Ambulatory Visit (INDEPENDENT_AMBULATORY_CARE_PROVIDER_SITE_OTHER)
Admission: EM | Admit: 2018-10-30 | Discharge: 2018-10-30 | Disposition: A | Discharge status: Home / Self Care | Payer: No Typology Code available for payment source | Source: Home / Self Care

## 2018-10-30 ENCOUNTER — Encounter (HOSPITAL_COMMUNITY): Payer: Self-pay

## 2018-10-30 ENCOUNTER — Emergency Department (HOSPITAL_COMMUNITY)
Admission: EM | Admit: 2018-10-30 | Discharge: 2018-10-31 | Disposition: A | Discharge status: Home / Self Care | Payer: No Typology Code available for payment source | Attending: Emergency Medicine | Admitting: Emergency Medicine

## 2018-10-30 ENCOUNTER — Emergency Department (HOSPITAL_COMMUNITY): Payer: No Typology Code available for payment source

## 2018-10-30 ENCOUNTER — Encounter (HOSPITAL_COMMUNITY): Payer: Self-pay | Admitting: Emergency Medicine

## 2018-10-30 DIAGNOSIS — R102 Pelvic and perineal pain unspecified side: Secondary | ICD-10-CM

## 2018-10-30 DIAGNOSIS — I10 Essential (primary) hypertension: Secondary | ICD-10-CM | POA: Diagnosis not present

## 2018-10-30 DIAGNOSIS — Z79899 Other long term (current) drug therapy: Secondary | ICD-10-CM | POA: Insufficient documentation

## 2018-10-30 DIAGNOSIS — R1031 Right lower quadrant pain: Secondary | ICD-10-CM | POA: Diagnosis not present

## 2018-10-30 DIAGNOSIS — G8929 Other chronic pain: Secondary | ICD-10-CM

## 2018-10-30 DIAGNOSIS — N83201 Unspecified ovarian cyst, right side: Secondary | ICD-10-CM | POA: Insufficient documentation

## 2018-10-30 DIAGNOSIS — Z87891 Personal history of nicotine dependence: Secondary | ICD-10-CM | POA: Diagnosis not present

## 2018-10-30 LAB — CBC
HCT: 43.9 % (ref 36.0–46.0)
Hemoglobin: 14.6 g/dL (ref 12.0–15.0)
MCH: 31.9 pg (ref 26.0–34.0)
MCHC: 33.3 g/dL (ref 30.0–36.0)
MCV: 95.9 fL (ref 80.0–100.0)
Platelets: 289 10*3/uL (ref 150–400)
RBC: 4.58 MIL/uL (ref 3.87–5.11)
RDW: 12 % (ref 11.5–15.5)
WBC: 8.4 10*3/uL (ref 4.0–10.5)
nRBC: 0 % (ref 0.0–0.2)

## 2018-10-30 LAB — COMPREHENSIVE METABOLIC PANEL
ALT: 14 U/L (ref 0–44)
AST: 19 U/L (ref 15–41)
Albumin: 3.7 g/dL (ref 3.5–5.0)
Alkaline Phosphatase: 66 U/L (ref 38–126)
Anion gap: 11 (ref 5–15)
BUN: 8 mg/dL (ref 6–20)
CO2: 24 mmol/L (ref 22–32)
Calcium: 8.8 mg/dL — ABNORMAL LOW (ref 8.9–10.3)
Chloride: 102 mmol/L (ref 98–111)
Creatinine, Ser: 0.92 mg/dL (ref 0.44–1.00)
GFR calc Af Amer: >60 mL/min (ref >60)
GFR calc non Af Amer: >60 mL/min (ref >60)
Glucose, Bld: 103 mg/dL — ABNORMAL HIGH (ref 70–99)
Potassium: 4.3 mmol/L (ref 3.5–5.1)
Sodium: 137 mmol/L (ref 135–145)
Total Bilirubin: 0.6 mg/dL (ref 0.3–1.2)
Total Protein: 6.3 g/dL — ABNORMAL LOW (ref 6.5–8.1)

## 2018-10-30 LAB — URINALYSIS, ROUTINE W REFLEX MICROSCOPIC
Bacteria, UA: NONE SEEN
Bilirubin Urine: NEGATIVE
Glucose, UA: NEGATIVE mg/dL
Ketones, ur: NEGATIVE mg/dL
Leukocytes,Ua: NEGATIVE
Nitrite: NEGATIVE
Protein, ur: NEGATIVE mg/dL
Specific Gravity, Urine: 1.016 (ref 1.005–1.030)
pH: 5 (ref 5.0–8.0)

## 2018-10-30 LAB — I-STAT BETA HCG BLOOD, ED (MC, WL, AP ONLY): I-stat hCG, quantitative: <5 m[IU]/mL (ref <5)

## 2018-10-30 LAB — LIPASE, BLOOD: Lipase: 22 U/L (ref 11–51)

## 2018-10-30 MED ORDER — SODIUM CHLORIDE 0.9% FLUSH: 3.0000 mL | Freq: Once | INTRAVENOUS | Status: DC

## 2018-10-30 MED ORDER — IOHEXOL 300 MG/ML  SOLN
100.0000 mL | Freq: Once | INTRAMUSCULAR | Status: AC | PRN
  Administered 2018-10-30: 23:00:00 100 mL via INTRAVENOUS

## 2018-10-30 MED ORDER — ONDANSETRON HCL 4 MG/2ML IJ SOLN
4.0000 mg | Freq: Once | INTRAMUSCULAR | Status: AC
  Administered 2018-10-30: 4 mg via INTRAVENOUS
  Filled 2018-10-30: qty 2

## 2018-10-30 MED ORDER — MORPHINE SULFATE (PF) 4 MG/ML IV SOLN
4.0000 mg | Freq: Once | INTRAVENOUS | Status: AC
  Administered 2018-10-30: 4 mg via INTRAVENOUS
  Filled 2018-10-30: qty 1

## 2018-10-30 MED ORDER — KETOROLAC TROMETHAMINE 30 MG/ML IJ SOLN
30.0000 mg | Freq: Once | INTRAMUSCULAR | Status: AC
  Administered 2018-10-30: 20:00:00 30 mg via INTRAVENOUS
  Filled 2018-10-30: qty 1

## 2018-10-30 MED ORDER — ONDANSETRON HCL 4 MG PO TABS: 4.0000 mg | ORAL_TABLET | Freq: Four times a day (QID) | ORAL | 0 refills | Status: DC

## 2018-10-30 MED ORDER — HYDROCODONE-ACETAMINOPHEN 5-325 MG PO TABS: 1.0000 | ORAL_TABLET | Freq: Four times a day (QID) | ORAL | 0 refills | Status: DC | PRN

## 2018-10-30 NOTE — ED Notes (Signed)
Called to CT to assess IV site.  +blood return, no difficulty flushing.  Some redness and hives noted above site. Pt had been given morphine and zofran.  Reports redness and hives started after IV push.

## 2018-10-30 NOTE — ED Triage Notes (Addendum)
C/o RLQ pain that has been worse over the last week.  Reports history of hemorrhagic cyst on R ovary that has been "dissipating".  States she is unable to have intercourse without having severe pain.  Pt has IUD.  States she uses vaginal valium.

## 2018-10-30 NOTE — Discharge Instructions (Addendum)
Take Vicodin every 6 hours as needed for severe pain.  Do not drive or operate machinery while taking this medication.  Make sure to take it with some food.  Take Zofran every 6 hours as needed for nausea.  Please follow-up with your OB/GYN this week for further evaluation and treatment of your symptoms.  Please return to the emergency department if you develop any new or worsening symptoms.  Do not drink alcohol, drive, operate machinery or participate in any other potentially dangerous activities while taking opiate pain medication as it may make you sleepy. Do not take this medication with any other sedating medications, either prescription or over-the-counter. If you were prescribed Percocet or Vicodin, do not take these with acetaminophen (Tylenol) as it is already contained within these medications and overdose of Tylenol is dangerous.   This medication is an opiate (or narcotic) pain medication and can be habit forming.  Use it as little as possible to achieve adequate pain control.  Do not use or use it with extreme caution if you have a history of opiate abuse or dependence. This medication is intended for your use only - do not give any to anyone else and keep it in a secure place where nobody else, especially children, have access to it. It will also cause or worsen constipation, so you may want to consider taking an over-the-counter stool softener while you are taking this medication.

## 2018-10-30 NOTE — ED Provider Notes (Signed)
[facility]    CSN: [id number] Arrival date & time: 10/30/18  1721      History   Chief Complaint Chief Complaint  Patient presents with  . Vaginal Pain    HPI Jennifer Salazar is a 38 y.o. female.   Presents with ongoing vaginal pain and lower abdominal pain.  She has been seen for this numerous times at her OB/GYN and in the emergency department.  She states the pain became acute again after having vaginal intercourse with her husband.  She has been treating her symptoms with vaginal diazepam.  She reports her pain 10/10 and describes it as "excruciating".  The history is provided by the patient.    Past Medical History:  Diagnosis Date  . Anxiety   . Hypertension   . Ovarian cyst     Patient Active Problem List   Diagnosis Date Noted  . Hemorrhagic cyst of ovary 09/07/2018  . Panic disorder 04/26/2018  . Generalized anxiety disorder 03/26/2018  . Bladder problem 11/06/2016  . Hypertensive disorder 11/06/2016  . Gastroesophageal reflux disease without esophagitis 11/06/2016    Past Surgical History:  Procedure Laterality Date  . WISDOM TOOTH EXTRACTION      OB History    Gravida  0   Para  0   Term  0   Preterm  0   AB  0   Living  0     SAB  0   TAB  0   Ectopic  0   Multiple  0   Live Births  0            Home Medications    Prior to Admission medications   Medication Sig Start Date End Date Taking? Authorizing Provider  lisinopril (ZESTRIL) 20 MG tablet TAKE 1 TABLET BY MOUTH TWICE A DAY 09/29/18  Yes Myles LippsSantiago, Irma M, MD  ALPRAZolam Prudy Feeler(XANAX) 0.5 MG tablet Take 1 tablet (0.5 mg total) by mouth every 6 (six) hours as needed for anxiety. 10/27/18 01/25/19  Pucilowski, Roosvelt Maserlgierd A, MD  butalbital-acetaminophen-caffeine (FIORICET, ESGIC) 50-325-40 MG tablet Take 1 tablet by mouth as needed for migraine. 08/24/17   [provider]  calcium carbonate (TUMS - DOSED IN MG ELEMENTAL CALCIUM) 500 MG chewable tablet Chew 1 tablet  by mouth 2 (two) times daily as needed for indigestion or heartburn.    [provider]  cyclobenzaprine (FLEXERIL) 10 MG tablet Take 1 tablet (10 mg total) by mouth 2 (two) times daily as needed for muscle spasms. Patient not taking: Reported on 09/10/2018 08/12/18   Dahlia ByesBast, Traci A, NP  diazepam (VALIUM) 10 MG tablet Take 10 mg by mouth at bedtime as needed for anxiety. vaginally    [provider]  HYDROcodone-acetaminophen (NORCO/VICODIN) 5-325 MG tablet Take 1-2 tablets by mouth every 6 (six) hours as needed. Patient not taking: Reported on 09/10/2018 09/05/18   Petrucelli, Pleas KochSamantha R, PA-C  levonorgestrel (MIRENA) 20 MCG/24HR IUD by Intrauterine route.    [provider]  Lidocaine-Glycerin (PREPARATION H EX) Apply 1 application topically daily as needed (hemorrhoids).    [provider]  lisinopril (ZESTRIL) 10 MG tablet Take 10 mg by mouth daily.    [provider]  meclizine (ANTIVERT) 25 MG tablet Take 1 tablet (25 mg total) by mouth 3 (three) times daily as needed for dizziness. 07/02/18   Loren RacerYelverton, David, MD  naproxen (NAPROSYN) 500 MG tablet Take 1 tablet (500 mg total) by mouth 2 (two) times daily. Patient not taking: Reported  on 09/07/2018 09/05/18   Petrucelli, Glynda Jaeger, PA-C  norethindrone (AYGESTIN) 5 MG tablet Take 1 tablet (5 mg total) by mouth daily. 09/19/18   Donnamae Jude, MD  omeprazole (PRILOSEC) 40 MG capsule Take 40 mg by mouth daily as needed (heartburn).    [provider]  oxyCODONE-acetaminophen (PERCOCET) 5-325 MG tablet Take 1 tablet by mouth every 4 (four) hours as needed. Patient not taking: Reported on 09/19/2018 09/10/18   Dalia Heading, PA-C  sertraline (ZOLOFT) 100 MG tablet Take 1 tablet (100 mg total) by mouth daily. 10/12/18 01/10/19  Pucilowski, Olgierd A, MD  sucralfate (CARAFATE) 1 g tablet TAKE 1 TABLET BY MOUTH FOUR TIMES DAILY WITH MEALS AND AT BEDTIME FOR 14 DAYS Patient not taking: Reported on  09/05/2018 06/17/18   Thornton Park, MD  tamsulosin (FLOMAX) 0.4 MG CAPS capsule Take 0.4 mg by mouth daily. 09/15/18   [provider]  triamcinolone cream (KENALOG) 0.1 % Apply 1 application topically 2 (two) times daily as needed (rash on hands).  01/07/18   [provider]  pantoprazole (PROTONIX) 40 MG tablet Take 1 tablet (40 mg total) by mouth daily. Patient not taking: Reported on 09/05/2018 06/16/18 09/05/18  Thornton Park, MD  potassium chloride SA (K-DUR) 20 MEQ tablet Take 1 tablet (20 mEq total) by mouth daily. Patient not taking: Reported on 09/05/2018 07/03/18 09/05/18  Julianne Rice, MD    Family History Family History  Problem Relation Age of Onset  . Hypertension Mother   . COPD Father   . Crohn's disease Father   . Anxiety disorder Father   . Drug abuse Sister   . Anxiety disorder Brother     Social History Social History   Tobacco Use  . Smoking status: Former Smoker    Packs/day: 0.25    Years: 10.00    Pack years: 2.50    Types: Cigarettes    Quit date: 11/08/2017    Years since quitting: 0.9  . Smokeless tobacco: Never Used  Substance Use Topics  . Alcohol use: Not Currently    Frequency: Never  . Drug use: Never     Allergies   Benadryl [diphenhydramine] and Reglan [metoclopramide]   Review of Systems Review of Systems  Constitutional: Negative for chills and fever.  HENT: Negative for ear pain and sore throat.   Eyes: Negative for pain and visual disturbance.  Respiratory: Negative for cough and shortness of breath.   Cardiovascular: Negative for chest pain and palpitations.  Gastrointestinal: Negative for abdominal pain and vomiting.  Genitourinary: Positive for vaginal pain. Negative for dysuria, flank pain, hematuria and vaginal discharge.  Musculoskeletal: Negative for arthralgias and back pain.  Skin: Negative for color change and rash.  Neurological: Negative for seizures and syncope.  All other systems reviewed  and are negative.    Physical Exam Triage Vital Signs ED Triage Vitals  Enc Vitals Group     BP 10/30/18 1805 128/88     Pulse Rate 10/30/18 1805 98     Resp 10/30/18 1805 16     Temp 10/30/18 1805 98.5 F (36.9 C)     Temp Source 10/30/18 1805 Temporal     SpO2 10/30/18 1805 100 %     Weight --      Height --      Head Circumference --      Peak Flow --      Pain Score 10/30/18 1803 10     Pain Loc --  Pain Edu? --      Excl. in GC? --    No data found.  Updated Vital Signs BP 128/88 (BP Location: Right Arm)   Pulse 98   Temp 98.5 F (36.9 C) (Temporal)   Resp 16   SpO2 100%   Visual Acuity Right Eye Distance:   Left Eye Distance:   Bilateral Distance:    Right Eye Near:   Left Eye Near:    Bilateral Near:     Physical Exam Vitals signs and nursing note reviewed.  Constitutional:      General: She is not in acute distress.    Appearance: She is well-developed.     Comments: Tearful throughout visit.    HENT:     Head: Normocephalic and atraumatic.  Eyes:     Conjunctiva/sclera: Conjunctivae normal.  Neck:     Musculoskeletal: Neck supple.  Cardiovascular:     Rate and Rhythm: Normal rate and regular rhythm.     Heart sounds: No murmur.  Pulmonary:     Effort: Pulmonary effort is normal. No respiratory distress.     Breath sounds: Normal breath sounds.  Abdominal:     Palpations: Abdomen is soft.     Tenderness: There is no abdominal tenderness.  Skin:    General: Skin is warm and dry.  Neurological:     Mental Status: She is alert.      UC Treatments / Results  Labs (all labs ordered are listed, but only abnormal results are displayed) Labs Reviewed - No data to display  EKG   Radiology No results found.  Procedures Procedures (including critical care time)  Medications Ordered in UC Medications - No data to display  Initial Impression / Assessment and Plan / UC Course  I have reviewed the triage vital signs and the  nursing notes.  Pertinent labs & imaging results that were available during my care of the patient were reviewed by me and considered in my medical decision making (see chart for details).   Vaginal pain.  Sending patient to the emergency department for evaluation based on her level of pain.  She has been seen multiple times for this issue in the last 2 months but she reports she is having the worst pain of her life which came on acutely after sexual intercourse.     Final Clinical Impressions(s) / UC Diagnoses   Final diagnoses:  Vaginal pain     Discharge Instructions     Go to the emergency department for evaluation of your acute pain.    ED Prescriptions    None     Controlled Substance Prescriptions [place] Controlled Substance Registry consulted? Not Applicable   Mickie Bail, NP 10/30/18 1902

## 2018-10-30 NOTE — ED Triage Notes (Signed)
Patient presents to Urgent Care with complaints of urinary and uterine problems. Patient reports she had a hemorrhagic cyst that has been dissipating and she has been using vaginal valium. Pt had intercourse with her partner last night and her vagina is in excruciating pain. Pt has been seen by her OBGYN and urologist, pt came here because she does not know what else to do.  Pt has IUD, does not know if it is related to her pain.

## 2018-10-30 NOTE — ED Notes (Signed)
Patient transported to CT 

## 2018-10-30 NOTE — Discharge Instructions (Addendum)
Go to the emergency department for evaluation of your acute pain.   °

## 2018-10-30 NOTE — ED Notes (Signed)
Patient transported to Ultrasound 

## 2018-10-30 NOTE — ED Provider Notes (Signed)
[facility]   CSN: [id number] Arrival date & time: 10/30/18  1856     History   Chief Complaint Chief Complaint  Patient presents with  . Ovarian Cyst  . Abdominal Pain    HPI Jennifer Salazar is a 38 y.o. female with history of hypertension, anxiety, ovarian cyst who has been dealing with a right ovarian cyst for the past couple months who presents with worsening pain for the past week.  It got worse after having sexual intercourse.  She has had pain mostly in the right lower side.  She reports having a period last week for the first time in years.  She is unsure if this happened before after sexual intercourse.  She denies any urinary symptoms or abnormal vaginal discharge.  She denies any back pain, fever, chest pain, shortness of breath.  Patient reports the pain feels similar to all of the ovarian cyst pain she has had in the past, but it is much worse.  She reports she is unable to take NSAID medication because of her gastric ulcers.     HPI  Past Medical History:  Diagnosis Date  . Anxiety   . Hypertension   . Ovarian cyst     Patient Active Problem List   Diagnosis Date Noted  . Hemorrhagic cyst of ovary 09/07/2018  . Panic disorder 04/26/2018  . Generalized anxiety disorder 03/26/2018  . Bladder problem 11/06/2016  . Hypertensive disorder 11/06/2016  . Gastroesophageal reflux disease without esophagitis 11/06/2016    Past Surgical History:  Procedure Laterality Date  . WISDOM TOOTH EXTRACTION       OB History    Gravida  0   Para  0   Term  0   Preterm  0   AB  0   Living  0     SAB  0   TAB  0   Ectopic  0   Multiple  0   Live Births  0            Home Medications    Prior to Admission medications   Medication Sig Start Date End Date Taking? Authorizing Provider  acetaminophen (TYLENOL) 500 MG tablet Take 1,000 mg by mouth every 6 (six) hours as needed for headache (pain).    Yes [provider]  ALPRAZolam (XANAX) 0.5 MG tablet Take 1 tablet (0.5 mg total) by mouth every 6 (six) hours as needed for anxiety. 10/27/18 01/25/19 Yes Pucilowski, Roosvelt Maserlgierd A, MD  aspirin-acetaminophen-caffeine (EXCEDRIN MIGRAINE) [phone] MG tablet Take 1 tablet by mouth daily as needed for headache.   Yes [provider]  butalbital-acetaminophen-caffeine (FIORICET, ESGIC) 50-325-40 MG tablet Take 1 tablet by mouth daily as needed for headache or migraine.  08/24/17  Yes [provider]  calcium carbonate (TUMS - DOSED IN MG ELEMENTAL CALCIUM) 500 MG chewable tablet Chew 1 tablet by mouth 2 (two) times daily as needed for indigestion or heartburn.   Yes [provider]  cyclobenzaprine (FLEXERIL) 10 MG tablet Take 1 tablet (10 mg total) by mouth 2 (two) times daily as needed for muscle spasms. 08/12/18  Yes Bast, Traci A, NP  diazepam (VALIUM) 10 MG tablet 10 mg See admin instructions. Insert one tablet (10 mg) vaginally daily at bedtime   Yes [provider]  levonorgestrel (MIRENA) 20 MCG/24HR IUD 1 each by Intrauterine route once. Implanted winter 2017   Yes [provider]  lisinopril (ZESTRIL) 20 MG tablet TAKE 1  TABLET BY MOUTH TWICE A DAY Patient taking differently: Take 10 mg by mouth at bedtime.  09/29/18  Yes Rutherford Guys, MD  meclizine (ANTIVERT) 25 MG tablet Take 1 tablet (25 mg total) by mouth 3 (three) times daily as needed for dizziness. 07/02/18  Yes Julianne Rice, MD  norethindrone (AYGESTIN) 5 MG tablet Take 1 tablet (5 mg total) by mouth daily. Patient taking differently: Take 5 mg by mouth every morning.  09/19/18  Yes Donnamae Jude, MD  omeprazole (PRILOSEC) 40 MG capsule Take 40 mg by mouth daily as needed (heartburn).   Yes [provider]  phenylephrine-shark liver oil-mineral oil-petrolatum (PREPARATION H) 0.25-3-14-71.9 % rectal ointment Place 1 application rectally 2 (two) times daily as needed for  hemorrhoids.   Yes [provider]  sertraline (ZOLOFT) 100 MG tablet Take 1 tablet (100 mg total) by mouth daily. Patient taking differently: Take 100 mg by mouth every morning.  10/12/18 01/10/19 Yes Pucilowski, Olgierd A, MD  tamsulosin (FLOMAX) 0.4 MG CAPS capsule Take 0.4 mg by mouth every morning.  09/15/18  Yes [provider]  triamcinolone cream (KENALOG) 0.1 % Apply 1 application topically 2 (two) times daily as needed (rash on hands).  01/07/18  Yes [provider]  HYDROcodone-acetaminophen (NORCO/VICODIN) 5-325 MG tablet Take 1-2 tablets by mouth every 6 (six) hours as needed for severe pain. 10/30/18   Camerin Jimenez, Bea Graff, PA-C  naproxen (NAPROSYN) 500 MG tablet Take 1 tablet (500 mg total) by mouth 2 (two) times daily. Patient not taking: Reported on 09/07/2018 09/05/18   Petrucelli, Samantha R, PA-C  ondansetron (ZOFRAN) 4 MG tablet Take 1 tablet (4 mg total) by mouth every 6 (six) hours. 10/30/18   Beckett Maden, Bea Graff, PA-C  oxyCODONE-acetaminophen (PERCOCET) 5-325 MG tablet Take 1 tablet by mouth every 4 (four) hours as needed. Patient not taking: Reported on 09/19/2018 09/10/18   Dalia Heading, PA-C  sucralfate (CARAFATE) 1 g tablet TAKE 1 TABLET BY MOUTH FOUR TIMES DAILY WITH MEALS AND AT BEDTIME FOR 14 DAYS Patient not taking: Reported on 09/05/2018 06/17/18   Thornton Park, MD  pantoprazole (PROTONIX) 40 MG tablet Take 1 tablet (40 mg total) by mouth daily. Patient not taking: Reported on 09/05/2018 06/16/18 09/05/18  Thornton Park, MD  potassium chloride SA (K-DUR) 20 MEQ tablet Take 1 tablet (20 mEq total) by mouth daily. Patient not taking: Reported on 09/05/2018 07/03/18 09/05/18  Julianne Rice, MD    Family History Family History  Problem Relation Age of Onset  . Hypertension Mother   . COPD Father   . Crohn's disease Father   . Anxiety disorder Father   . Drug abuse Sister   . Anxiety disorder Brother     Social History Social History    Tobacco Use  . Smoking status: Former Smoker    Packs/day: 0.25    Years: 10.00    Pack years: 2.50    Types: Cigarettes    Quit date: 11/08/2017    Years since quitting: 0.9  . Smokeless tobacco: Never Used  Substance Use Topics  . Alcohol use: Not Currently    Frequency: Never  . Drug use: Never     Allergies   Benadryl [diphenhydramine] and Reglan [metoclopramide]   Review of Systems Review of Systems  Constitutional: Negative for chills and fever.  HENT: Negative for facial swelling and sore throat.   Respiratory: Negative for shortness of breath.   Cardiovascular: Negative for chest pain.  Gastrointestinal: Positive for abdominal pain  and nausea. Negative for vomiting.  Genitourinary: Positive for pelvic pain. Negative for dysuria and vaginal discharge.  Musculoskeletal: Negative for back pain.  Skin: Negative for rash and wound.  Neurological: Negative for headaches.  Psychiatric/Behavioral: The patient is not nervous/anxious.      Physical Exam Updated Vital Signs BP 113/79   Pulse 74   Temp 98.9 F (37.2 C) (Oral)   Resp 17   LMP 10/25/2018   SpO2 98%   Physical Exam Vitals signs and nursing note reviewed. Exam conducted with a chaperone present.  Constitutional:      General: She is not in acute distress.    Appearance: She is well-developed. She is not diaphoretic.     Comments: Tearful  HENT:     Head: Normocephalic and atraumatic.     Mouth/Throat:     Pharynx: No oropharyngeal exudate.  Eyes:     General: No scleral icterus.       Right eye: No discharge.        Left eye: No discharge.     Conjunctiva/sclera: Conjunctivae normal.     Pupils: Pupils are equal, round, and reactive to light.  Neck:     Musculoskeletal: Normal range of motion and neck supple.     Thyroid: No thyromegaly.  Cardiovascular:     Rate and Rhythm: Normal rate and regular rhythm.     Heart sounds: Normal heart sounds. No murmur. No friction rub. No gallop.    Pulmonary:     Effort: Pulmonary effort is normal. No respiratory distress.     Breath sounds: Normal breath sounds. No stridor. No wheezing or rales.  Abdominal:     General: Bowel sounds are normal. There is no distension.     Palpations: Abdomen is soft.     Tenderness: There is no abdominal tenderness. There is no right CVA tenderness, left CVA tenderness, guarding or rebound. Negative signs include McBurney's sign.    Genitourinary:    Vagina: Vaginal discharge (white, could be physiologic) present.     Cervix: No cervical motion tenderness.     Uterus: Normal.      Adnexa:        Right: No tenderness.         Left: No tenderness.       Comments: IUD strings noted at cervical os Lymphadenopathy:     Cervical: No cervical adenopathy.  Skin:    General: Skin is warm and dry.     Coloration: Skin is not pale.     Findings: No rash.  Neurological:     Mental Status: She is alert.     Coordination: Coordination normal.  Psychiatric:        Mood and Affect: Mood is anxious.      ED Treatments / Results  Labs (all labs ordered are listed, but only abnormal results are displayed) Labs Reviewed  COMPREHENSIVE METABOLIC PANEL - Abnormal; Notable for the following components:      Result Value   Glucose, Bld 103 (*)    Calcium 8.8 (*)    Total Protein 6.3 (*)    All other components within normal limits  URINALYSIS, ROUTINE W REFLEX MICROSCOPIC - Abnormal; Notable for the following components:   Hgb urine dipstick SMALL (*)    All other components within normal limits  WET PREP, GENITAL  LIPASE, BLOOD  CBC  I-STAT BETA HCG BLOOD, ED ([facility], [facility], [facility] ONLY)  GC/CHLAMYDIA PROBE AMP ([place]) NOT AT [facility]  EKG None  Radiology Ct Abdomen Pelvis W Contrast  Result Date: 10/30/2018 CLINICAL DATA:  Right lower quadrant abdominal pain EXAM: CT ABDOMEN AND PELVIS WITH CONTRAST TECHNIQUE: Multidetector CT imaging of the abdomen and pelvis was performed using the standard  protocol following bolus administration of intravenous contrast. CONTRAST:  OMNIPAQUE IOHEXOL 300 MG/ML  SOLN COMPARISON:  Ultrasound 10/30/2018 FINDINGS: Lower chest: No acute abnormality. Hepatobiliary: No focal liver abnormality is seen. No gallstones, gallbladder wall thickening, or biliary dilatation. Pancreas: Unremarkable. No pancreatic ductal dilatation or surrounding inflammatory changes. Spleen: Normal in size without focal abnormality. Adrenals/Urinary Tract: Adrenal glands are unremarkable. Kidneys are normal, without renal calculi, focal lesion, or hydronephrosis. Bladder is unremarkable. Stomach/Bowel: Stomach is within normal limits. Appendix appears normal. No evidence of bowel wall thickening, distention, or inflammatory changes. Vascular/Lymphatic: No significant vascular findings are present. No enlarged abdominal or pelvic lymph nodes. Reproductive: IUD within the uterus.  No adnexal mass Other: No abdominal wall hernia or abnormality. No abdominopelvic ascites. Musculoskeletal: No acute or significant osseous findings. IMPRESSION: Negative. No CT evidence for acute intra-abdominal or pelvic abnormality. Negative for acute appendicitis Electronically Signed   By: Jasmine Pang M.D.   On: 10/30/2018 23:41   US Pelvic Complete W Transvaginal And Torsion R/o  Result Date: 10/30/2018 CLINICAL DATA:  Pelvic pain for 1 month EXAM: TRANSABDOMINAL AND TRANSVAGINAL ULTRASOUND OF PELVIS DOPPLER ULTRASOUND OF OVARIES TECHNIQUE: Both transabdominal and transvaginal ultrasound examinations of the pelvis were performed. Transabdominal technique was performed for global imaging of the pelvis including uterus, ovaries, adnexal regions, and pelvic cul-de-sac. It was necessary to proceed with endovaginal exam following the transabdominal exam to visualize the uterus endometrium ovaries. Color and duplex Doppler ultrasound was utilized to evaluate blood flow to the ovaries. COMPARISON:  Ultrasound  10/09/2018, CT 09/05/2018 FINDINGS: Uterus Measurements: 6.5 x 3.2 x 4.3 cm = volume: 46.7 mL. No fibroids or other mass visualized. Endometrium Unable 2 discretely measure due to IUD.  IUD within the endometrium. Right ovary Measurements: 3 x 1.9 x 2 cm = volume: 5.9 mL. Normal appearance/no adnexal mass. Left ovary Measurements: 3.2 x 1.6 x 1.9 cm = volume: 5 mL. Normal appearance/no adnexal mass. Pulsed Doppler evaluation of both ovaries demonstrates normal low-resistance arterial and venous waveforms. Other findings No abnormal free fluid. IMPRESSION: 1. Negative for ovarian torsion or ovarian mass. 2. IUD in place. Electronically Signed   By: Jasmine Pang M.D.   On: 10/30/2018 21:16    Procedures Procedures (including critical care time)  Medications Ordered in ED Medications  sodium chloride flush (NS) 0.9 % injection 3 mL (has no administration in time range)  ketorolac (TORADOL) 30 MG/ML injection 30 mg (30 mg Intravenous Given 10/30/18 2026)  ondansetron (ZOFRAN) injection 4 mg (4 mg Intravenous Given 10/30/18 2232)  morphine 4 MG/ML injection 4 mg (4 mg Intravenous Given 10/30/18 2232)  iohexol (OMNIPAQUE) 300 MG/ML solution 100 mL (100 mLs Intravenous Contrast Given 10/30/18 2324)     Initial Impression / Assessment and Plan / ED Course  I have reviewed the triage vital signs and the nursing notes.  Pertinent labs & imaging results that were available during my care of the patient were reviewed by me and considered in my medical decision making (see chart for details).        Patient presents with ongoing right lower quadrant pain, worse than what she has been experiencing over the past couple months.  She reports she did have an ovarian cyst there, however  it was getting better the last ultrasound she had.  The pain feels similar, but worse.  Labs are unremarkable.  Patient does have pain in the right pelvic region.  Her ultrasound is negative.  There is no free fluid or follicles  noted.  No torsion.  Considering this is negative, CT abdomen pelvis was ordered to rule out appendicitis, which is also negative.  Patient has been told she could have endometriosis in the past, which I feel could be a likely possibility.  Wet prep negative.  GC/chlamydia sent and pending.  Low suspicion of PID at this time.  Patient is unable to take NSAID medication because of gastric ulcers.  Will discharge home with short course of Vicodin.  We will also discharge home with Zofran.  Return precautions discussed.  Follow-up to OB/GYN as soon as possible for further evaluation and treatment.  Patient vitals stable throughout ED course and discharged in satisfactory condition. I discussed patient case with Dr. Anitra Lauth who guided the patient's management and agrees with plan.   Final Clinical Impressions(s) / ED Diagnoses   Final diagnoses:  Right lower quadrant abdominal pain  Pelvic pain in female    ED Discharge Orders         Ordered    HYDROcodone-acetaminophen (NORCO/VICODIN) 5-325 MG tablet  Every 6 hours PRN     10/30/18 2357    ondansetron (ZOFRAN) 4 MG tablet  Every 6 hours     10/30/18 2357           Emi Holes, PA-C 10/31/18 1746    Gwyneth Sprout, MD 10/31/18 1851

## 2018-10-31 ENCOUNTER — Encounter: Payer: Self-pay | Admitting: Family Medicine

## 2018-10-31 ENCOUNTER — Other Ambulatory Visit: Payer: Self-pay

## 2018-10-31 DIAGNOSIS — N83209 Unspecified ovarian cyst, unspecified side: Secondary | ICD-10-CM

## 2018-10-31 LAB — WET PREP, GENITAL
Clue Cells Wet Prep HPF POC: NONE SEEN
Sperm: NONE SEEN
Trich, Wet Prep: NONE SEEN
WBC, Wet Prep HPF POC: NONE SEEN
Yeast Wet Prep HPF POC: NONE SEEN

## 2018-10-31 MED ORDER — NORETHINDRONE ACETATE 5 MG PO TABS: 5.0000 mg | ORAL_TABLET | Freq: Every day | ORAL | 2 refills | Status: DC

## 2018-10-31 NOTE — Telephone Encounter (Signed)
Refill on Norethindrone 5 mg

## 2018-11-01 LAB — GC/CHLAMYDIA PROBE AMP (~~LOC~~) NOT AT ARMC
Chlamydia: NEGATIVE
Neisseria Gonorrhea: NEGATIVE

## 2018-11-22 DIAGNOSIS — R102 Pelvic and perineal pain: Secondary | ICD-10-CM

## 2018-11-23 MED ORDER — ONDANSETRON 4 MG PO TBDP: 4.0000 mg | ORAL_TABLET | Freq: Four times a day (QID) | ORAL | 0 refills | Status: DC | PRN

## 2018-11-23 MED ORDER — TRAMADOL HCL 50 MG PO TABS: 50.0000 mg | ORAL_TABLET | Freq: Four times a day (QID) | ORAL | 0 refills | Status: DC | PRN

## 2018-11-28 ENCOUNTER — Other Ambulatory Visit: Payer: Self-pay | Admitting: *Deleted

## 2018-11-28 DIAGNOSIS — N83209 Unspecified ovarian cyst, unspecified side: Secondary | ICD-10-CM

## 2018-11-28 MED ORDER — NORETHINDRONE ACETATE 5 MG PO TABS: 5.0000 mg | ORAL_TABLET | Freq: Every day | ORAL | 2 refills | Status: DC

## 2018-11-28 NOTE — Progress Notes (Signed)
Received a refill request to changed norethindrone to 90 days- routed to provider Garlin Batdorf,RN

## 2018-12-07 ENCOUNTER — Telehealth (HOSPITAL_COMMUNITY): Payer: Self-pay

## 2018-12-07 ENCOUNTER — Other Ambulatory Visit (HOSPITAL_COMMUNITY): Payer: Self-pay

## 2018-12-07 NOTE — Telephone Encounter (Signed)
Patient called me from a plane on her way to [place], she forgot her medication. Patient would like to know if she can get a one week supply until she gets home.

## 2018-12-12 ENCOUNTER — Telehealth: Payer: Self-pay | Admitting: *Deleted

## 2018-12-12 ENCOUNTER — Other Ambulatory Visit (HOSPITAL_COMMUNITY): Payer: Self-pay | Admitting: Psychiatry

## 2018-12-12 ENCOUNTER — Telehealth (HOSPITAL_COMMUNITY): Payer: Self-pay

## 2018-12-12 DIAGNOSIS — R102 Pelvic and perineal pain: Secondary | ICD-10-CM

## 2018-12-12 MED ORDER — ONDANSETRON 4 MG PO TBDP: 4.0000 mg | ORAL_TABLET | Freq: Four times a day (QID) | ORAL | 0 refills | Status: DC | PRN

## 2018-12-12 NOTE — Telephone Encounter (Signed)
Jennifer Salazar left a voicemessage this am stating she is calling about  Prescription refill and that she is a patient of Dr. Kennon Rounds. States she didn't request it thru her pharmacy because she is out of town. States her Mom had surgery and she is helping her and she is in [place]. States not sure how to get refill. Wants zofran and tramadol but states not sure if tramadol can be sent out of state.  [name],RN

## 2018-12-12 NOTE — Telephone Encounter (Addendum)
Patient called and stated that she's been under a lot of stress while she's been away in [place] taking care of her mom and her grandparents. She stated that she's been doing well when she's at home but not while she's been away. She also stated that she went through more than expected due to the circumstances and she has 2 of her Alprazolam 0.5mg  left. She had been taking 2 tablets every 6 hours instead of 1. She is using [facility] [address] in [place]. Patient has a scheduled appointment on 12/20/18. Please review and advise. Thank you.

## 2018-12-12 NOTE — Telephone Encounter (Signed)
I called Jennifer Salazar and she confirms she has used almost all of her zofran and tramadol. We discussed that Dr. Kennon Rounds may not be able to fill her meds since she is out of state and also that pain med is limited. She reports she is moving around more and not able to rest like usual because she is helping her Mom. I asked her for the local pharmacy there for if we can send in prescription and informed her I will forward her request to Dr. Kennon Rounds and we will get back to her once we hear from Dr. Kennon Rounds. She voices understanding. Sai Zinn,RN

## 2018-12-14 ENCOUNTER — Telehealth (HOSPITAL_COMMUNITY): Payer: Self-pay

## 2018-12-14 MED ORDER — LORAZEPAM 0.5 MG PO TABS: 0.5000 mg | ORAL_TABLET | Freq: Three times a day (TID) | ORAL | 0 refills | Status: DC | PRN

## 2018-12-14 NOTE — Telephone Encounter (Signed)
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 I sent a message to Dr. Montel Culver regarding this patient's situation but she's gotten worse. Her husband has to be tested for Covid tomorrow because he was exposed at work and the patient is very distraught. She mentioned being concerned about panic attacks as well. She has a scheduled appointment with Dr. Montel Culver on 12/20/18 but really needs to talk to a doctor before then. Would it be possible to call her at [phone]? Please review. Thank you.

## 2018-12-14 NOTE — Telephone Encounter (Signed)
I returned patient's phone call.  She is very anxious, tearful and distraught.  She admitted using Xanax more than usual because her anxiety is not under control.  Recently she find out that her husband is exposed to Covid and having symptoms.  Patient went to [place] taking care of her mother and her grandparents and she needed Xanax earlier than her scheduled time.  Not she again ran out as she admitted taking more than usual.  She endorsed poor sleep, increased anxiety, crying spells and headaches.  I discussed she could have withdrawals from benzodiazepines as a last Xanax he took on Monday.  She is also taking Zoloft.  She has upcoming appointment with Dr. Mamie Nick on November 3.  I discussed with patient that it seems like the current Xanax is not working as she has been running out before her time.  We talked about switching her benzodiazepine to either Klonopin or lorazepam.  She recall the Klonopin had made her very sleepy and groggy.  She agreed to give up trial of lorazepam.  I also suggested that she should discuss with Dr. Mamie Nick on her next appointment about increasing the dose of Zoloft.  We will try lorazepam 0.5 mg she can take up to 2-3 times a day as needed for severe anxiety.  Discussed benzodiazepine dependence, tolerance and withdrawal symptoms.  I also suggested that she should call us if symptoms do not improve.  I will call lorazepam 0.5 mg to her local [id number] until she see Dr. Mamie Nick on her appointment.

## 2018-12-16 NOTE — Telephone Encounter (Signed)
I called and notified Levenia we did fill one of the prescriptions that she requested; but not the second. Please call us if you have questions. Linda,RN

## 2018-12-19 ENCOUNTER — Ambulatory Visit (INDEPENDENT_AMBULATORY_CARE_PROVIDER_SITE_OTHER): Payer: No Typology Code available for payment source | Admitting: Psychiatry

## 2018-12-19 ENCOUNTER — Other Ambulatory Visit: Payer: Self-pay

## 2018-12-19 DIAGNOSIS — F410 Panic disorder [episodic paroxysmal anxiety] without agoraphobia: Secondary | ICD-10-CM

## 2018-12-19 DIAGNOSIS — F411 Generalized anxiety disorder: Secondary | ICD-10-CM

## 2018-12-19 MED ORDER — SERTRALINE HCL 100 MG PO TABS: 150.0000 mg | ORAL_TABLET | Freq: Every day | ORAL | 0 refills | Status: DC

## 2018-12-19 MED ORDER — ALPRAZOLAM 1 MG PO TABS: 1.0000 mg | ORAL_TABLET | Freq: Three times a day (TID) | ORAL | 0 refills | Status: DC | PRN

## 2018-12-19 NOTE — Progress Notes (Signed)
BH MD/PA/NP OP Progress Note  12/19/2018 3:16 PM Katha Kuehne  MRN:  [id number] Interview was conducted by phone and I verified that I was speaking with the correct person using two identifiers. I discussed the limitations of evaluation and management by telemedicine and  the availability of in person appointments. Patient expressed understanding and agreed to proceed.  Chief Complaint: Anxiety.  HPI: 38 yo married female with generalized and panic type anxiety. She has episodic panic attacks and excessive worrying (about multiple physical ailments/complaints -abdominal pain, vertigo,headaches, heartburn). No depression, no SI. She is on Zoloft(100 mg)as well. In the past on 1 mg tid of alprazolam - more recently 0.5 mg prn up to 4 x daily.She was in [place] visiting her mother and upon return found out that yer husband tested positive for COVID (has mild symptoms). Anxiety increased - she worries if sh did not pass it to her mother, ruminating about having prodromal sx. She will go to have her test tomorrow and they are both in quarantine. She works for [employer]. She was given a short rx for alprazolam 0.5 mg for anxiety by Dr. Lolly Mustache She finds it less effective than alprazolam though.     Visit Diagnosis:    ICD-10-CM   1. Panic disorder  F41.0   2. Generalized anxiety disorder  F41.1     Past Psychiatric History: Please see intake H&P>  Past Medical History:  Past Medical History:  Diagnosis Date  . Anxiety   . Hypertension   . Ovarian cyst     Past Surgical History:  Procedure Laterality Date  . WISDOM TOOTH EXTRACTION      Family Psychiatric History: Reviewed.  Family History:  Family History  Problem Relation Age of Onset  . Hypertension Mother   . COPD Father   . Crohn's disease Father   . Anxiety disorder Father   . Drug abuse Sister   . Anxiety disorder Brother     Social History:  Social History   Socioeconomic History  . Marital status: Married     Spouse name: Otilio Saber  . Number of children: 0  . Years of education: Not on file  . Highest education level: Some college, no degree  Occupational History  . Not on file  Social Needs  . Financial resource strain: Not hard at all  . Food insecurity    Worry: Never true    Inability: Never true  . Transportation needs    Medical: No    Non-medical: No  Tobacco Use  . Smoking status: Former Smoker    Packs/day: 0.25    Years: 10.00    Pack years: 2.50    Types: Cigarettes    Quit date: 11/08/2017    Years since quitting: 1.1  . Smokeless tobacco: Never Used  Substance and Sexual Activity  . Alcohol use: Not Currently    Frequency: Never  . Drug use: Never  . Sexual activity: Yes    Birth control/protection: I.U.D.  Lifestyle  . Physical activity    Days per week: 0 days    Minutes per session: 0 min  . Stress: Very much  Relationships  . Social Musician on phone: Not on file    Gets together: Not on file    Attends religious service: Never    Active member of club or organization: No    Attends meetings of clubs or organizations: Never    Relationship status: Married  Other Topics Concern  .  Not on file  Social History Narrative  . Not on file    Allergies:  Allergies  Allergen Reactions  . Benadryl [Diphenhydramine] Other (See Comments)    Internal restlessness  . Reglan [Metoclopramide] Other (See Comments)    Hyperactivity, skin crawled    Metabolic Disorder Labs: No results found for: HGBA1C, MPG No results found for: PROLACTIN Lab Results  Component Value Date   CHOL 191 03/22/2018   TRIG 92 03/22/2018   HDL 46 03/22/2018   CHOLHDL 4.2 03/22/2018   LDLCALC 127 (H) 03/22/2018   Lab Results  Component Value Date   TSH 1.700 07/08/2018   TSH 2.730 03/22/2018    Therapeutic Level Labs: No results found for: LITHIUM No results found for: VALPROATE No components found for:  CBMZ  Current Medications: Current Outpatient  Medications  Medication Sig Dispense Refill  . acetaminophen (TYLENOL) 500 MG tablet Take 1,000 mg by mouth every 6 (six) hours as needed for headache (pain).    Marland Kitchen. ALPRAZolam (XANAX) 1 MG tablet Take 1 tablet (1 mg total) by mouth 3 (three) times daily as needed for anxiety. 90 tablet 0  . aspirin-acetaminophen-caffeine (EXCEDRIN MIGRAINE) 250-250-65 MG tablet Take 1 tablet by mouth daily as needed for headache.    . butalbital-acetaminophen-caffeine (FIORICET, ESGIC) 50-325-40 MG tablet Take 1 tablet by mouth daily as needed for headache or migraine.     . calcium carbonate (TUMS - DOSED IN MG ELEMENTAL CALCIUM) 500 MG chewable tablet Chew 1 tablet by mouth 2 (two) times daily as needed for indigestion or heartburn.    . cyclobenzaprine (FLEXERIL) 10 MG tablet Take 1 tablet (10 mg total) by mouth 2 (two) times daily as needed for muscle spasms. 20 tablet 0  . diazepam (VALIUM) 10 MG tablet 10 mg See admin instructions. Insert one tablet (10 mg) vaginally daily at bedtime    . HYDROcodone-acetaminophen (NORCO/VICODIN) 5-325 MG tablet Take 1-2 tablets by mouth every 6 (six) hours as needed for severe pain. 6 tablet 0  . levonorgestrel (MIRENA) 20 MCG/24HR IUD 1 each by Intrauterine route once. Implanted winter 2017    . lisinopril (ZESTRIL) 20 MG tablet TAKE 1 TABLET BY MOUTH TWICE A DAY (Patient taking differently: Take 10 mg by mouth at bedtime. ) 180 tablet 1  . meclizine (ANTIVERT) 25 MG tablet Take 1 tablet (25 mg total) by mouth 3 (three) times daily as needed for dizziness. 30 tablet 0  . naproxen (NAPROSYN) 500 MG tablet Take 1 tablet (500 mg total) by mouth 2 (two) times daily. (Patient not taking: Reported on 09/07/2018) 10 tablet 0  . norethindrone (AYGESTIN) 5 MG tablet Take 1 tablet (5 mg total) by mouth daily. 90 tablet 2  . omeprazole (PRILOSEC) 40 MG capsule Take 40 mg by mouth daily as needed (heartburn).    . ondansetron (ZOFRAN ODT) 4 MG disintegrating tablet Take 1 tablet (4 mg  total) by mouth every 6 (six) hours as needed for nausea. 20 tablet 0  . ondansetron (ZOFRAN) 4 MG tablet Take 1 tablet (4 mg total) by mouth every 6 (six) hours. 12 tablet 0  . oxyCODONE-acetaminophen (PERCOCET) 5-325 MG tablet Take 1 tablet by mouth every 4 (four) hours as needed. (Patient not taking: Reported on 09/19/2018) 10 tablet 0  . phenylephrine-shark liver oil-mineral oil-petrolatum (PREPARATION H) 0.25-3-14-71.9 % rectal ointment Place 1 application rectally 2 (two) times daily as needed for hemorrhoids.    . sertraline (ZOLOFT) 100 MG tablet Take 1.5 tablets (150 mg total)  by mouth daily. 135 tablet 0  . sucralfate (CARAFATE) 1 g tablet TAKE 1 TABLET BY MOUTH FOUR TIMES DAILY WITH MEALS AND AT BEDTIME FOR 14 DAYS (Patient not taking: Reported on 09/05/2018) 360 tablet 1  . tamsulosin (FLOMAX) 0.4 MG CAPS capsule Take 0.4 mg by mouth every morning.     . traMADol (ULTRAM) 50 MG tablet Take 1 tablet (50 mg total) by mouth every 6 (six) hours as needed. 10 tablet 0  . triamcinolone cream (KENALOG) 0.1 % Apply 1 application topically 2 (two) times daily as needed (rash on hands).      No current facility-administered medications for this visit.      Psychiatric Specialty Exam: Review of Systems  HENT: Positive for sore throat.   Psychiatric/Behavioral: The patient is nervous/anxious.     There were no vitals taken for this visit.There is no height or weight on file to calculate BMI.  General Appearance: NA  Eye Contact:  NA  Speech:  Clear and Coherent and Normal Rate  Volume:  Normal  Mood:  Anxious  Affect:  NA  Thought Process:  Goal Directed  Orientation:  Full (Time, Place, and Person)  Thought Content: Rumination   Suicidal Thoughts:  No  Homicidal Thoughts:  No  Memory:  Immediate;   Good Recent;   Good Remote;   Good  Judgement:  Good  Insight:  Fair  Psychomotor Activity:  NA  Concentration:  Concentration: Good  Recall:  Good  Fund of Knowledge: Good  Language:  Good  Akathisia:  Negative  Handed:  Right  AIMS (if indicated): not done  Assets:  Communication Skills Desire for Improvement Financial Resources/Insurance Housing Physical Health Social Support Talents/Skills  ADL's:  Intact  Cognition: WNL  Sleep:  Fair   Screenings: GAD-7     Office Visit from 07/08/2018 in Primary Care at [facility] Office Visit from 04/29/2018 in Primary Care at [facility] Visit from 03/28/2018 in Primary Care at [facility] Visit from 03/22/2018 in Primary Care at [facility]  Total GAD-7 Score  PHQ2-9     Office Visit from 07/28/2018 in Primary Care at [facility] Visit from 07/08/2018 in Primary Care at [facility] Visit from 04/29/2018 in Primary Care at [facility] Visit from 03/28/2018 in Primary Care at [facility] Visit from 03/22/2018 in Primary Care at [place]  PHQ-2 Total Score  0  3  1  0  2  PHQ-9 Total Score  -  8       Assessment and Plan: 38 yo married female with generalized and panic type anxiety. She has episodic panic attacks and excessive worrying (about multiple physical ailments/complaints -abdominal pain, vertigo,headaches, heartburn). No depression, no SI. She is on Zoloft(100 mg)as well. In the past on 1 mg tid of alprazolam - more recently 0.5 mg prn up to 4 x daily.She was in [place] visiting her mother and upon return found out that yer husband tested positive for COVID (has mild symptoms). Anxiety increased - she worries if sh did not pass it to her mother, ruminating about having prodromal sx. She will go to have her test tomorrow and they are both in quarantine. She works for [employer]. She was given a short rx for alprazolam 0.5 mg for anxiety by Dr. Lolly Mustache She finds it less effective than alprazolam though.   Dx: GAD/Panic disorder  Plan: Restart alprazolambut 1 mg tid  prnanxiety/sleepq 6 hr and increaseZoloft to150 mg daily.The plan was discussed with patient who had an opportunity to ask  questions and these were all answered. I spend25 minuteson the phonewith the patient.Next visit in one month.      Stephanie Acre, MD 12/19/2018, 3:16 PM

## 2018-12-20 ENCOUNTER — Other Ambulatory Visit: Payer: Self-pay | Admitting: Family Medicine

## 2018-12-20 ENCOUNTER — Ambulatory Visit (HOSPITAL_COMMUNITY): Payer: No Typology Code available for payment source | Admitting: Psychiatry

## 2018-12-20 DIAGNOSIS — R102 Pelvic and perineal pain: Secondary | ICD-10-CM

## 2019-01-02 ENCOUNTER — Telehealth (HOSPITAL_COMMUNITY): Payer: Self-pay | Admitting: *Deleted

## 2019-01-02 NOTE — Telephone Encounter (Signed)
Pt tested positive for COVID today and is concerned that her anxiety will flare out of control. she is asking if Xanax can be increased or frequency increased. Please review and advise.

## 2019-01-02 NOTE — Telephone Encounter (Signed)
The dose no but she can use it up to 4 x daily. Once she runs out I will change the refill to qid so she can get it ahead of normal time.

## 2019-01-03 ENCOUNTER — Other Ambulatory Visit: Payer: Self-pay | Admitting: Family Medicine

## 2019-01-03 ENCOUNTER — Encounter (INDEPENDENT_AMBULATORY_CARE_PROVIDER_SITE_OTHER): Payer: Self-pay

## 2019-01-03 DIAGNOSIS — R102 Pelvic and perineal pain: Secondary | ICD-10-CM

## 2019-01-03 DIAGNOSIS — U071 COVID-19: Secondary | ICD-10-CM

## 2019-01-03 MED ORDER — TRAMADOL HCL 50 MG PO TABS: 50.0000 mg | ORAL_TABLET | Freq: Four times a day (QID) | ORAL | 0 refills | Status: DC | PRN

## 2019-01-04 ENCOUNTER — Telehealth: Payer: Self-pay | Admitting: Family Medicine

## 2019-01-04 ENCOUNTER — Telehealth: Payer: Self-pay

## 2019-01-04 ENCOUNTER — Encounter (INDEPENDENT_AMBULATORY_CARE_PROVIDER_SITE_OTHER): Payer: Self-pay

## 2019-01-04 ENCOUNTER — Other Ambulatory Visit (HOSPITAL_COMMUNITY): Payer: Self-pay

## 2019-01-04 MED ORDER — ALPRAZOLAM 1 MG PO TABS: 1.0000 mg | ORAL_TABLET | Freq: Four times a day (QID) | ORAL | 0 refills | Status: DC

## 2019-01-04 NOTE — Telephone Encounter (Signed)
Patient advise on vomiting per protocol:   If appetite becomes worse: encourage patient to drink fluids as tolerated, work their way up to bland solid food such as crackers, pretzels, soup, bread or applesauce and boiled starches.   If patient is unable to tolerate any foods or liquids, notify PCP.   IF PATIENT DEVELOPS SEVERE VOMITING (MORE THAN 6 TIMES A DAY AND OR >8 HOURS) AND/OR SEVERE ABDOMINAL PAIN ADVISE PATIENT TO CALL 911 AND SEEK TREATMENT IN ED  Patient states that she vomited on time last night. Patient will continue to hydrate and eat bland foods. Patient verbalized understanding and agrees with plan.

## 2019-01-04 NOTE — Telephone Encounter (Signed)
BPA received for new onset vomiting, loss of appetite via MyChart COVID symptom monitoring. Line busy, will address in [place].

## 2019-01-05 ENCOUNTER — Inpatient Hospital Stay
Admit: 2019-01-05 | Discharge: 2019-01-05 | Disposition: A | Discharge status: Home / Self Care | Payer: No Typology Code available for payment source

## 2019-01-05 ENCOUNTER — Telehealth: Payer: No Typology Code available for payment source

## 2019-01-05 ENCOUNTER — Ambulatory Visit
Admission: RE | Admit: 2019-01-05 | Discharge: 2019-01-05 | Discharge status: Against Medical Advice | Payer: No Typology Code available for payment source | Source: Ambulatory Visit

## 2019-01-05 ENCOUNTER — Ambulatory Visit: Payer: No Typology Code available for payment source | Admitting: Family Medicine

## 2019-01-05 ENCOUNTER — Encounter (INDEPENDENT_AMBULATORY_CARE_PROVIDER_SITE_OTHER): Payer: Self-pay

## 2019-01-05 ENCOUNTER — Encounter (HOSPITAL_COMMUNITY): Payer: Self-pay

## 2019-01-05 ENCOUNTER — Emergency Department (HOSPITAL_COMMUNITY)
Admission: EM | Admit: 2019-01-05 | Discharge: 2019-01-05 | Disposition: A | Discharge status: Home / Self Care | Payer: No Typology Code available for payment source | Attending: Emergency Medicine | Admitting: Emergency Medicine

## 2019-01-05 ENCOUNTER — Other Ambulatory Visit: Payer: Self-pay

## 2019-01-05 ENCOUNTER — Telehealth: Payer: Self-pay

## 2019-01-05 DIAGNOSIS — M791 Myalgia, unspecified site: Secondary | ICD-10-CM | POA: Diagnosis present

## 2019-01-05 DIAGNOSIS — I10 Essential (primary) hypertension: Secondary | ICD-10-CM | POA: Diagnosis not present

## 2019-01-05 DIAGNOSIS — U071 COVID-19: Secondary | ICD-10-CM | POA: Diagnosis not present

## 2019-01-05 DIAGNOSIS — Z79899 Other long term (current) drug therapy: Secondary | ICD-10-CM | POA: Insufficient documentation

## 2019-01-05 DIAGNOSIS — Z87891 Personal history of nicotine dependence: Secondary | ICD-10-CM | POA: Diagnosis not present

## 2019-01-05 MED ORDER — OXYCODONE-ACETAMINOPHEN 5-325 MG PO TABS
1.0000 | ORAL_TABLET | Freq: Once | ORAL | Status: AC
  Administered 2019-01-05: 16:00:00 1 via ORAL
  Filled 2019-01-05: qty 1

## 2019-01-05 MED ORDER — OXYCODONE-ACETAMINOPHEN 5-325 MG PO TABS: 1.0000 | ORAL_TABLET | ORAL | 0 refills | Status: DC | PRN

## 2019-01-05 MED ORDER — DIAZEPAM 5 MG PO TABS
5.0000 mg | ORAL_TABLET | Freq: Once | ORAL | Status: AC
  Administered 2019-01-05: 5 mg via ORAL
  Filled 2019-01-05: qty 1

## 2019-01-05 NOTE — ED Provider Notes (Signed)
[facility]-EMERGENCY DEPT Provider Note   CSN: [id number] Arrival date & time: 01/05/19  1410     History   Chief Complaint Chief Complaint  Patient presents with  . COVID +  . Generalized Body Aches  . Neck Pain    HPI Jennifer Salazar is a 38 y.o. female.     38 year old female with history of anxiety presents with body aches and weakness.  Was recently diagnosed with Covid.  Denies any chest pain or shortness of breath.  Had emesis several days ago but none currently.  Denies any severe diarrhea.  No recent fever or chills.  No photophobia or severe headaches.  States her anxiety has gotten worse and she did contact her doctor to see if she could have her dose of Xanax increased.  No dyspnea on exertion.  No leg pain or swelling.  Called her doctor due to worsening symptoms and was told to come here.     Past Medical History:  Diagnosis Date  . Anxiety   . Hypertension   . Ovarian cyst     Patient Active Problem List   Diagnosis Date Noted  . Hemorrhagic cyst of ovary 09/07/2018  . Panic disorder 04/26/2018  . Generalized anxiety disorder 03/26/2018  . Bladder problem 11/06/2016  . Hypertensive disorder 11/06/2016  . Gastroesophageal reflux disease without esophagitis 11/06/2016    Past Surgical History:  Procedure Laterality Date  . WISDOM TOOTH EXTRACTION       OB History    Gravida  0   Para  0   Term  0   Preterm  0   AB  0   Living  0     SAB  0   TAB  0   Ectopic  0   Multiple  0   Live Births  0            Home Medications    Prior to Admission medications   Medication Sig Start Date End Date Taking? Authorizing Provider  acetaminophen (TYLENOL) 500 MG tablet Take 1,000 mg by mouth every 6 (six) hours as needed for headache (pain).    [provider]  ALPRAZolam Prudy Feeler) 1 MG tablet Take 1 tablet (1 mg total) by mouth 4 (four) times daily. 01/04/19 02/03/19  Pucilowski, Roosvelt Maser, MD   aspirin-acetaminophen-caffeine (EXCEDRIN MIGRAINE) [phone] MG tablet Take 1 tablet by mouth daily as needed for headache.    [provider]  butalbital-acetaminophen-caffeine (FIORICET, ESGIC) 50-325-40 MG tablet Take 1 tablet by mouth daily as needed for headache or migraine.  08/24/17   [provider]  calcium carbonate (TUMS - DOSED IN MG ELEMENTAL CALCIUM) 500 MG chewable tablet Chew 1 tablet by mouth 2 (two) times daily as needed for indigestion or heartburn.    [provider]  cyclobenzaprine (FLEXERIL) 10 MG tablet Take 1 tablet (10 mg total) by mouth 2 (two) times daily as needed for muscle spasms. 08/12/18   Dahlia Byes A, NP  diazepam (VALIUM) 10 MG tablet 10 mg See admin instructions. Insert one tablet (10 mg) vaginally daily at bedtime    [provider]  HYDROcodone-acetaminophen (NORCO/VICODIN) 5-325 MG tablet Take 1-2 tablets by mouth every 6 (six) hours as needed for severe pain. 10/30/18   Emi Holes, PA-C  levonorgestrel (MIRENA) 20 MCG/24HR IUD 1 each by Intrauterine route once. Implanted winter 2017    [provider]  lisinopril (ZESTRIL) 20 MG tablet TAKE 1 TABLET BY MOUTH TWICE A  DAY Patient taking differently: Take 10 mg by mouth at bedtime.  09/29/18   Myles Lipps, MD  meclizine (ANTIVERT) 25 MG tablet Take 1 tablet (25 mg total) by mouth 3 (three) times daily as needed for dizziness. 07/02/18   Loren Racer, MD  naproxen (NAPROSYN) 500 MG tablet Take 1 tablet (500 mg total) by mouth 2 (two) times daily. Patient not taking: Reported on 09/07/2018 09/05/18   Petrucelli, Lelon Mast R, PA-C  norethindrone (AYGESTIN) 5 MG tablet Take 1 tablet (5 mg total) by mouth daily. 11/28/18   Reva Bores, MD  omeprazole (PRILOSEC) 40 MG capsule Take 40 mg by mouth daily as needed (heartburn).    [provider]  ondansetron (ZOFRAN ODT) 4 MG disintegrating tablet Take 1 tablet (4 mg total) by mouth every 6 (six) hours as  needed for nausea. 12/12/18   Reva Bores, MD  ondansetron (ZOFRAN) 4 MG tablet Take 1 tablet (4 mg total) by mouth every 6 (six) hours. 10/30/18   Law, Waylan Boga, PA-C  oxyCODONE-acetaminophen (PERCOCET) 5-325 MG tablet Take 1 tablet by mouth every 4 (four) hours as needed. Patient not taking: Reported on 09/19/2018 09/10/18   Charlestine Night, PA-C  phenylephrine-shark liver oil-mineral oil-petrolatum (PREPARATION H) 0.25-3-14-71.9 % rectal ointment Place 1 application rectally 2 (two) times daily as needed for hemorrhoids.    [provider]  sertraline (ZOLOFT) 100 MG tablet Take 1.5 tablets (150 mg total) by mouth daily. 12/19/18 03/19/19  Pucilowski, Olgierd A, MD  sucralfate (CARAFATE) 1 g tablet TAKE 1 TABLET BY MOUTH FOUR TIMES DAILY WITH MEALS AND AT BEDTIME FOR 14 DAYS Patient not taking: Reported on 09/05/2018 06/17/18   Tressia Danas, MD  tamsulosin (FLOMAX) 0.4 MG CAPS capsule Take 0.4 mg by mouth every morning.  09/15/18   [provider]  traMADol (ULTRAM) 50 MG tablet Take 1 tablet (50 mg total) by mouth every 6 (six) hours as needed. 01/03/19   Reva Bores, MD  triamcinolone cream (KENALOG) 0.1 % Apply 1 application topically 2 (two) times daily as needed (rash on hands).  01/07/18   [provider]  pantoprazole (PROTONIX) 40 MG tablet Take 1 tablet (40 mg total) by mouth daily. Patient not taking: Reported on 09/05/2018 06/16/18 09/05/18  Tressia Danas, MD  potassium chloride SA (K-DUR) 20 MEQ tablet Take 1 tablet (20 mEq total) by mouth daily. Patient not taking: Reported on 09/05/2018 07/03/18 09/05/18  Loren Racer, MD    Family History Family History  Problem Relation Age of Onset  . Hypertension Mother   . COPD Father   . Crohn's disease Father   . Anxiety disorder Father   . Drug abuse Sister   . Anxiety disorder Brother     Social History Social History   Tobacco Use  . Smoking status: Former Smoker    Packs/day: 0.25     Years: 10.00    Pack years: 2.50    Types: Cigarettes    Quit date: 11/08/2017    Years since quitting: 1.1  . Smokeless tobacco: Never Used  Substance Use Topics  . Alcohol use: Not Currently    Frequency: Never  . Drug use: Never     Allergies   Promethazine, Benadryl [diphenhydramine], and Reglan [metoclopramide]   Review of Systems Review of Systems  All other systems reviewed and are negative.    Physical Exam Updated Vital Signs BP (!) 133/93   Pulse (!) 114   Temp 99.1 F (37.3 C) (Oral)  Resp 17   Ht 1.575 m ( )   Wt 83.9 kg   SpO2 97%   BMI 33.84 kg/m   Physical Exam Vitals signs and nursing note reviewed.  Constitutional:      General: She is not in acute distress.    Appearance: Normal appearance. She is well-developed. She is not toxic-appearing.  HENT:     Head: Normocephalic and atraumatic.  Eyes:     General: Lids are normal.     Conjunctiva/sclera: Conjunctivae normal.     Pupils: Pupils are equal, round, and reactive to light.  Neck:     Musculoskeletal: Normal range of motion and neck supple.     Thyroid: No thyroid mass.     Trachea: No tracheal deviation.  Cardiovascular:     Rate and Rhythm: Normal rate and regular rhythm.     Heart sounds: Normal heart sounds. No murmur. No gallop.   Pulmonary:     Effort: Pulmonary effort is normal. No respiratory distress.     Breath sounds: Normal breath sounds. No stridor. No decreased breath sounds, wheezing, rhonchi or rales.  Abdominal:     General: Bowel sounds are normal. There is no distension.     Palpations: Abdomen is soft.     Tenderness: There is no abdominal tenderness. There is no rebound.  Musculoskeletal: Normal range of motion.        General: No tenderness.  Skin:    General: Skin is warm and dry.     Findings: No abrasion or rash.  Neurological:     Mental Status: She is alert and oriented to person, place, and time.     GCS: GCS eye subscore is 4. GCS verbal subscore  is 5. GCS motor subscore is 6.     Cranial Nerves: No cranial nerve deficit.     Sensory: No sensory deficit.  Psychiatric:        Attention and Perception: Attention normal.        Mood and Affect: Mood is anxious.        Speech: Speech normal.        Behavior: Behavior normal.      ED Treatments / Results  Labs (all labs ordered are listed, but only abnormal results are displayed) Labs Reviewed - No data to display  EKG None  Radiology No results found.  Procedures Procedures (including critical care time)  Medications Ordered in ED Medications  diazepam (VALIUM) tablet 5 mg (has no administration in time range)  oxyCODONE-acetaminophen (PERCOCET/ROXICET) 5-325 MG per tablet 1 tablet (has no administration in time range)     Initial Impression / Assessment and Plan / ED Course  I have reviewed the triage vital signs and the nursing notes.  Pertinent labs & imaging results that were available during my care of the patient were reviewed by me and considered in my medical decision making (see chart for details).        Patient denies chest pain or shortness of breath here.  She has been afebrile.  Patient admits to having increased anxiety and when no one is in the room patient's heart rate is in the high 90s but when I come to the room her heart rate goes up to the 120s.  Suspect anxiety is a culprit of this.  She was treated with Percocet and Valium and feels much better.  Her neck discomfort is all musculoskeletal and feels better after medication.  Will prescribe pain medication for her neck discomfort  and return precautions given  Final Clinical Impressions(s) / ED Diagnoses   Final diagnoses:  None    ED Discharge Orders    None       Lacretia Leigh, MD 01/05/19 1659

## 2019-01-05 NOTE — ED Triage Notes (Addendum)
Patient presents after testing positive to COVID 19 on Monday. Patient reports generalized aches as her primary symptom. Patient reports her neck aches- tried tramadol, toradol, and flexeril for pain. Patient reports she "feels dizzy." Patient also endorses nausea, with vomiting in the past. Keeping down fluids today. Patient reports loose stool. Patient denies fever - reports taking tylenol for low grade temp at home. Patient denies CP, states hx of pneumonia and states that her chest "doesn't feel like that." Denies SOB or productive cough. Patient also reports hx of anxiety- states she thinks a lot of her symptoms "might be from that because Im nervous."

## 2019-01-05 NOTE — ED Notes (Signed)
Discharge instructions reviewed with patient. Patient verbalizes understanding. VSS.   

## 2019-01-05 NOTE — ED Notes (Signed)
Patient reports husband dropped her off, so she will not be driving herself home.

## 2019-01-05 NOTE — Telephone Encounter (Signed)
Patient advise on weakness and chang in appetite per protocol:    If appetite becomes worse: encourage patient to drink fluids as tolerated, work their way up to bland solid food such as crackers, pretzels, soup, bread or applesauce and boiled starches.   If patient is unable to tolerate any foods or liquids, notify PCP.   IF PATIENT DEVELOPS SEVERE VOMITING (MORE THAN 6 TIMES A DAY AND OR >8 HOURS) AND/OR SEVERE ABDOMINAL PAIN ADVISE PATIENT TO CALL 911 AND SEEK TREATMENT IN ED   IF PATIENT HAS WORSENING WEAKNESS WITH INABILITY TO STAND OR IF PATIENT HAS TO HOLD ON TO SOMETHING TO GET BALANCE, ADVISE PATIENT TO CALL 911 AND SEEK TREATMENT IN ED  Patient states that she has neck pain. She doesn't really have a appetite but will try to eat some bland foods.

## 2019-01-06 ENCOUNTER — Telehealth: Payer: Self-pay | Admitting: *Deleted

## 2019-01-06 ENCOUNTER — Encounter (INDEPENDENT_AMBULATORY_CARE_PROVIDER_SITE_OTHER): Payer: Self-pay

## 2019-01-06 NOTE — Telephone Encounter (Signed)
Pt called due to receiving a BPA for worsening appetite from the MyChart COVID-19 symptom monitoring questionnaire. Pt states she has really bad anxiety and her prescription for xanax was called in early by her psychiatrist but can not filled until next week. Pt states she thinks that this may be causing her to have a lack of appetite. Pt states she is trying to eat and drink. Pt states she also spoke with the nurse at psychiatrist office regarding her prescription but did not realized they closed at 1pm today.Pt states that the pharmacist she spoke to regarding her medication was mean to her. Patient states that she was told that they should be able to override the medication but she has not received a call. Pt states she is currently on Zoloft and Xanax which is being tapered down.  Pt states she is extremely anxious all day and I think that is part of her not having an appetite.Panic attack earlier not bad just kind of crying. Pt tearful during the call. Pt states that she did talk to someone on the crisis line regarding her panic attacks today. Pt advised to eat and drink as tolerated. Pt advised to contact the crisis line again if symptoms continue regarding her panic attacks and anxiety continues. Pt verbalized understanding.

## 2019-01-07 ENCOUNTER — Other Ambulatory Visit: Payer: Self-pay

## 2019-01-07 ENCOUNTER — Emergency Department (HOSPITAL_COMMUNITY)
Admission: EM | Admit: 2019-01-07 | Discharge: 2019-01-07 | Disposition: A | Discharge status: Home / Self Care | Payer: No Typology Code available for payment source | Attending: Emergency Medicine | Admitting: Emergency Medicine

## 2019-01-07 ENCOUNTER — Emergency Department (HOSPITAL_COMMUNITY): Payer: No Typology Code available for payment source

## 2019-01-07 ENCOUNTER — Encounter (HOSPITAL_COMMUNITY): Payer: Self-pay

## 2019-01-07 DIAGNOSIS — U071 COVID-19: Secondary | ICD-10-CM

## 2019-01-07 DIAGNOSIS — Z20828 Contact with and (suspected) exposure to other viral communicable diseases: Secondary | ICD-10-CM | POA: Diagnosis not present

## 2019-01-07 DIAGNOSIS — F419 Anxiety disorder, unspecified: Secondary | ICD-10-CM | POA: Diagnosis not present

## 2019-01-07 DIAGNOSIS — R42 Dizziness and giddiness: Secondary | ICD-10-CM | POA: Insufficient documentation

## 2019-01-07 DIAGNOSIS — Z79899 Other long term (current) drug therapy: Secondary | ICD-10-CM | POA: Insufficient documentation

## 2019-01-07 DIAGNOSIS — I10 Essential (primary) hypertension: Secondary | ICD-10-CM | POA: Insufficient documentation

## 2019-01-07 DIAGNOSIS — Z87891 Personal history of nicotine dependence: Secondary | ICD-10-CM | POA: Insufficient documentation

## 2019-01-07 LAB — CBC WITH DIFFERENTIAL/PLATELET
Abs Immature Granulocytes: 0.02 10*3/uL (ref 0.00–0.07)
Basophils Absolute: 0 10*3/uL (ref 0.0–0.1)
Basophils Relative: 0 %
Eosinophils Absolute: 0 10*3/uL (ref 0.0–0.5)
Eosinophils Relative: 0 %
HCT: 40 % (ref 36.0–46.0)
Hemoglobin: 13.2 g/dL (ref 12.0–15.0)
Immature Granulocytes: 0 %
Lymphocytes Relative: 14 %
Lymphs Abs: 1.2 10*3/uL (ref 0.7–4.0)
MCH: 31.3 pg (ref 26.0–34.0)
MCHC: 33 g/dL (ref 30.0–36.0)
MCV: 94.8 fL (ref 80.0–100.0)
Monocytes Absolute: 0.4 10*3/uL (ref 0.1–1.0)
Monocytes Relative: 5 %
Neutro Abs: 7.1 10*3/uL (ref 1.7–7.7)
Neutrophils Relative %: 81 %
Platelets: 293 10*3/uL (ref 150–400)
RBC: 4.22 MIL/uL (ref 3.87–5.11)
RDW: 11.9 % (ref 11.5–15.5)
WBC: 8.8 10*3/uL (ref 4.0–10.5)
nRBC: 0 % (ref 0.0–0.2)

## 2019-01-07 LAB — BASIC METABOLIC PANEL
Anion gap: 9 (ref 5–15)
BUN: 7 mg/dL (ref 6–20)
CO2: 22 mmol/L (ref 22–32)
Calcium: 8.3 mg/dL — ABNORMAL LOW (ref 8.9–10.3)
Chloride: 111 mmol/L (ref 98–111)
Creatinine, Ser: 0.76 mg/dL (ref 0.44–1.00)
GFR calc Af Amer: >60 mL/min (ref >60)
GFR calc non Af Amer: >60 mL/min (ref >60)
Glucose, Bld: 105 mg/dL — ABNORMAL HIGH (ref 70–99)
Potassium: 3.8 mmol/L (ref 3.5–5.1)
Sodium: 142 mmol/L (ref 135–145)

## 2019-01-07 MED ORDER — LORAZEPAM 1 MG PO TABS
1.0000 mg | ORAL_TABLET | Freq: Once | ORAL | Status: AC
  Administered 2019-01-07: 1 mg via ORAL
  Filled 2019-01-07: qty 1

## 2019-01-07 MED ORDER — LORAZEPAM 1 MG PO TABS
1.0000 mg | ORAL_TABLET | Freq: Once | ORAL | Status: AC
  Administered 2019-01-07: 17:00:00 1 mg via ORAL
  Filled 2019-01-07: qty 1

## 2019-01-07 MED ORDER — ALBUTEROL SULFATE HFA 108 (90 BASE) MCG/ACT IN AERS: 1.0000 | INHALATION_SPRAY | Freq: Four times a day (QID) | RESPIRATORY_TRACT | 0 refills | Status: AC | PRN

## 2019-01-07 MED ORDER — MECLIZINE HCL 25 MG PO TABS: 25.0000 mg | ORAL_TABLET | Freq: Three times a day (TID) | ORAL | 0 refills | Status: AC | PRN

## 2019-01-07 MED ORDER — DIAZEPAM 5 MG PO TABS: 5.0000 mg | ORAL_TABLET | Freq: Once | ORAL | Status: DC

## 2019-01-07 MED ORDER — ACETAMINOPHEN 325 MG PO TABS
650.0000 mg | ORAL_TABLET | Freq: Once | ORAL | Status: AC
  Administered 2019-01-07: 650 mg via ORAL
  Filled 2019-01-07: qty 2

## 2019-01-07 MED ORDER — SODIUM CHLORIDE 0.9 % IV BOLUS
1000.0000 mL | Freq: Once | INTRAVENOUS | Status: AC
  Administered 2019-01-07: 1000 mL via INTRAVENOUS

## 2019-01-07 MED ORDER — MECLIZINE HCL 25 MG PO TABS
25.0000 mg | ORAL_TABLET | Freq: Once | ORAL | Status: AC
  Administered 2019-01-07: 25 mg via ORAL
  Filled 2019-01-07: qty 1

## 2019-01-07 NOTE — ED Provider Notes (Signed)
[facility]-EMERGENCY DEPT Provider Note   CSN: [id number] Arrival date & time: 01/07/19  3500     History   Chief Complaint Chief Complaint  Patient presents with  . Dizziness  . Anxiety  . COVID POSITIVE    HPI Jennifer Salazar is a 38 y.o. female with past medical history significant for anxiety, hypertension presents to emergency department today with chief complaint of dizziness and anxiety x 3 days. She report symptoms have progressively worsened in the last 24 hours. She has not tried any medications for her symptoms prior to arrival. Pt was recently diagnosed with COVID 01/02/19. She has had very little PO intake in the last week because she hasn't been hungry. She describes the dizziness as feeling like the room is spinning. Symptoms worse when changing positions. Also admits to history of vertigo and has prescription for meclizine at home.  Patient states her anxiety has significantly increased since being diagnosed with Covid.  She was attempting to decrease Xanax use however had to increase it instead.  She ran out of her Xanax 4 days ago.  She called her psychiatrist who sent prescription to the pharmacy.  She states she tried to pick it up but was unable to because it was too early.  She states the office was closed and she was unable to contact psychiatrist to discuss further.  She denies fever, chills, cough, congestion, shortness of breath, chest pain, abdominal pain, nausea, vomiting, urinary symptoms, diarrhea.   Past Medical History:  Diagnosis Date  . Anxiety   . Hypertension   . Ovarian cyst     Patient Active Problem List   Diagnosis Date Noted  . Hemorrhagic cyst of ovary 09/07/2018  . Panic disorder 04/26/2018  . Generalized anxiety disorder 03/26/2018  . Bladder problem 11/06/2016  . Hypertensive disorder 11/06/2016  . Gastroesophageal reflux disease without esophagitis 11/06/2016    Past Surgical History:  Procedure Laterality  Date  . WISDOM TOOTH EXTRACTION       OB History    Gravida  0   Para  0   Term  0   Preterm  0   AB  0   Living  0     SAB  0   TAB  0   Ectopic  0   Multiple  0   Live Births  0            Home Medications    Prior to Admission medications   Medication Sig Start Date End Date Taking? Authorizing Provider  ALPRAZolam Prudy Feeler) 1 MG tablet Take 1 tablet (1 mg total) by mouth 4 (four) times daily. Patient taking differently: Take 1 mg by mouth 4 (four) times daily as needed for anxiety.  01/04/19 02/03/19 Yes Pucilowski, Olgierd A, MD  calcium carbonate (TUMS - DOSED IN MG ELEMENTAL CALCIUM) 500 MG chewable tablet Chew 1 tablet by mouth 2 (two) times daily as needed for indigestion or heartburn.   Yes [provider]  levonorgestrel (MIRENA) 20 MCG/24HR IUD 1 each by Intrauterine route once. Implanted winter 2017   Yes [provider]  lisinopril (ZESTRIL) 20 MG tablet TAKE 1 TABLET BY MOUTH TWICE A DAY Patient taking differently: Take 10 mg by mouth every morning.  09/29/18  Yes Myles Lipps, MD  oxyCODONE-acetaminophen (PERCOCET/ROXICET) 5-325 MG tablet Take 1-2 tablets by mouth every 4 (four) hours as needed for severe pain. 01/05/19  Yes Lorre Nick, MD  sertraline (ZOLOFT) 100 MG tablet Take  1.5 tablets (150 mg total) by mouth daily. 12/19/18 03/19/19 Yes Pucilowski, Olgierd A, MD  tamsulosin (FLOMAX) 0.4 MG CAPS capsule Take 0.4 mg by mouth every morning.  09/15/18  Yes [provider]  traMADol (ULTRAM) 50 MG tablet Take 1 tablet (50 mg total) by mouth every 6 (six) hours as needed. Patient taking differently: Take 50 mg by mouth every 6 (six) hours as needed for moderate pain.  01/03/19  Yes Donnamae Jude, MD  ondansetron (ZOFRAN ODT) 4 MG disintegrating tablet Take 1 tablet (4 mg total) by mouth every 6 (six) hours as needed for nausea. Patient not taking: Reported on 01/07/2019 12/12/18   Donnamae Jude, MD  pantoprazole  (PROTONIX) 40 MG tablet Take 1 tablet (40 mg total) by mouth daily. Patient not taking: Reported on 09/05/2018 06/16/18 09/05/18  Thornton Park, MD  potassium chloride SA (K-DUR) 20 MEQ tablet Take 1 tablet (20 mEq total) by mouth daily. Patient not taking: Reported on 09/05/2018 07/03/18 09/05/18  Julianne Rice, MD    Family History Family History  Problem Relation Age of Onset  . Hypertension Mother   . COPD Father   . Crohn's disease Father   . Anxiety disorder Father   . Drug abuse Sister   . Anxiety disorder Brother     Social History Social History   Tobacco Use  . Smoking status: Former Smoker    Packs/day: 0.25    Years: 10.00    Pack years: 2.50    Types: Cigarettes    Quit date: 11/08/2017    Years since quitting: 1.1  . Smokeless tobacco: Never Used  Substance Use Topics  . Alcohol use: Not Currently    Frequency: Never  . Drug use: Never     Allergies   Promethazine, Benadryl [diphenhydramine], and Reglan [metoclopramide]   Review of Systems Review of Systems  Constitutional: Negative for chills and fever.  HENT: Negative for congestion, ear discharge, ear pain, sinus pressure, sinus pain and sore throat.   Eyes: Negative for pain and redness.  Respiratory: Negative for cough and shortness of breath.   Cardiovascular: Negative for chest pain.  Gastrointestinal: Negative for abdominal pain, constipation, diarrhea, nausea and vomiting.  Genitourinary: Negative for dysuria and hematuria.  Musculoskeletal: Negative for back pain and neck pain.  Skin: Negative for wound.  Neurological: Positive for dizziness. Negative for weakness, numbness and headaches.     Physical Exam Updated Vital Signs BP (!) 146/100   Pulse (!) 103   Temp 98.1 F (36.7 C) (Oral)   Resp 18   SpO2 98%   Physical Exam Vitals signs and nursing note reviewed.  Constitutional:      General: She is not in acute distress.    Appearance: She is not ill-appearing.  HENT:      Head: Normocephalic and atraumatic.     Comments: No tenderness to palpation of skull. No deformities or crepitus noted. No open wounds, abrasions or lacerations.    Right Ear: Tympanic membrane and external ear normal.     Left Ear: Tympanic membrane and external ear normal.     Nose: Nose normal.     Mouth/Throat:     Mouth: Mucous membranes are moist.     Pharynx: Oropharynx is clear.  Eyes:     General: No scleral icterus.       Right eye: No discharge.        Left eye: No discharge.     Extraocular Movements: Extraocular movements intact.  Conjunctiva/sclera: Conjunctivae normal.     Pupils: Pupils are equal, round, and reactive to light.  Neck:     Musculoskeletal: Normal range of motion.     Vascular: No JVD.  Cardiovascular:     Rate and Rhythm: Normal rate and regular rhythm.     Pulses: Normal pulses.          Radial pulses are 2+ on the right side and 2+ on the left side.     Heart sounds: Normal heart sounds.  Pulmonary:     Comments: Lungs clear to auscultation in all fields. Symmetric chest rise. No wheezing, rales, or rhonchi. Abdominal:     Comments: Abdomen is soft, non-distended, and non-tender in all quadrants. No rigidity, no guarding. No peritoneal signs.  Musculoskeletal: Normal range of motion.  Skin:    General: Skin is warm and dry.     Capillary Refill: Capillary refill takes less than 2 seconds.  Neurological:     Mental Status: She is oriented to person, place, and time.     GCS: GCS eye subscore is 4. GCS verbal subscore is 5. GCS motor subscore is 6.     Comments: Speech is clear and goal oriented, follows commands CN III-XII intact, no facial droop Normal strength in upper and lower extremities bilaterally including dorsiflexion and plantar flexion, strong and equal grip strength Sensation normal to light and sharp touch Moves extremities without ataxia, coordination intact Normal finger to nose and rapid alternating movements Normal gait and  balance  Psychiatric:        Mood and Affect: Mood is anxious.        Behavior: Behavior normal.      ED Treatments / Results  Labs (all labs ordered are listed, but only abnormal results are displayed) Labs Reviewed  BASIC METABOLIC PANEL - Abnormal; Notable for the following components:      Result Value   Glucose, Bld 105 (*)    Calcium 8.3 (*)    All other components within normal limits  CBC WITH DIFFERENTIAL/PLATELET    EKG None  Radiology Dg Chest Portable 1 View  Result Date: 01/07/2019 CLINICAL DATA:  COVID positive, weakness. EXAM: PORTABLE CHEST 1 VIEW COMPARISON:  CT abdomen pelvis dated 10/30/2018 FINDINGS: The heart size and mediastinal contours are within normal limits. Both lungs are clear. The visualized skeletal structures are unremarkable. IMPRESSION: No active disease. Electronically Signed   By: Romona Curls M.D.   On: 01/07/2019 15:28    Procedures Procedures (including critical care time)  Medications Ordered in ED Medications  sodium chloride 0.9 % bolus 1,000 mL (0 mLs Intravenous Stopped 01/07/19 1404)  LORazepam (ATIVAN) tablet 1 mg (1 mg Oral Given 01/07/19 1157)  meclizine (ANTIVERT) tablet 25 mg (25 mg Oral Given 01/07/19 1413)     Initial Impression / Assessment and Plan / ED Course  I have reviewed the triage vital signs and the nursing notes.  Pertinent labs & imaging results that were available during my care of the patient were reviewed by me and considered in my medical decision making (see chart for details).  Patient seen and examined. She is afebrile, but tachycardic to 103 on arrival. Lungs are clear to station all fields.  Normal work of breathing.  Symmetric chest rise.  Abdomen is nontender, no peritoneal signs.  Neuro exam unremarkable.  Patient appears dehydrated, mucous membranes are dry and she has significant decreased p.o. intake over the last week.  Her dizziness seems more consistent with  lightheadedness, is worse when  she changes position.  Will give IV fluids and reassess.  Chart review shows that she has a positive Covid test on 01/02/2019 at a [facility] facility. Will not repeat Covid test today.  Reassessment patient continues to have lightheadedness when changing positions.  She states she cannot walk or get around because her symptoms are so severe.  Discussed case with ED attending Dr. Rubin PayorPickering and agree with plan to check basic labs and chest x-ray given her Covid diagnosis.  Patient also given 1 mg of p.o. Ativan.  She is out of her Xanax prescription and has a prescription pending at the pharmacy.  She was unable to pick it up as the pharmacist states it was not due yet.  Lengthy discussion with patient regarding Xanax use, she is aware that we will not prescribe Xanax from the emergency department.  Labs today show no leukocytosis, no anemia no severe electrolyte derangement, no renal insufficiency.  Chest x-ray viewed by me is negative for any signs of infiltrate, no acute findings.  Patient care transferred to Malachi CarlH, Khatri at the end of my shift pending reassessment. Patient presentation, ED course, and plan of care discussed with review of all pertinent labs and imaging. Please see her note for further details regarding further ED course and disposition.  If symptoms do not improve, will consider admission.   Debroah LoopSarah Redcay was evaluated in Emergency Department on 01/07/2019 for the symptoms described in the history of present illness. She was evaluated in the context of the global COVID-19 pandemic, which necessitated consideration that the patient might be at risk for infection with the SARS-CoV-2 virus that causes COVID-19. Institutional protocols and algorithms that pertain to the evaluation of patients at risk for COVID-19 are in a state of rapid change based on information released by regulatory bodies including the CDC and federal and state organizations. These policies and algorithms were followed  during the patient's care in the ED.    Final Clinical Impressions(s) / ED Diagnoses   Final diagnoses:  None    ED Discharge Orders    None       Kathyrn Lasslbrizze, Teasha Murrillo E, PA-C 01/07/19 1549    Benjiman CorePickering, Nathan, MD 01/08/19 1514

## 2019-01-07 NOTE — ED Triage Notes (Signed)
Pt states she is having severe vertigo and anxiety. Pt Dx with Coved positive 01/02/19. Pt denies SOB, fever, or other outward Covid symptoms. Pt states she needed refill for Xanax but was unable to fill it.

## 2019-01-07 NOTE — ED Provider Notes (Signed)
  Physical Exam  BP (!) 120/94   Pulse 97   Temp 98.1 F (36.7 C) (Oral)   Resp (!) 26   SpO2 94%   Physical Exam Vitals signs and nursing note reviewed.  Constitutional:      General: She is not in acute distress.    Appearance: She is well-developed. She is not diaphoretic.  HENT:     Head: Normocephalic and atraumatic.  Eyes:     General: No scleral icterus.    Conjunctiva/sclera: Conjunctivae normal.  Neck:     Musculoskeletal: Normal range of motion.  Pulmonary:     Effort: Pulmonary effort is normal. No respiratory distress.  Skin:    Findings: No rash.  Neurological:     Mental Status: She is alert.     ED Course/Procedures     Procedures  MDM  Care handed off from previous provider PA Albrizze.  Please see their note for further detail.  Briefly, patient is a 38 year old female with recent positive Covid test, anxiety on Xanax, hypertension presenting to the ED for dizziness and anxiety for the past 3days.  Has a history of vertigo and has meclizine at home.  Mostly she is anxious about her recent diagnosis with Covid and not being able to take her Xanax for the past 4 days.  She receives this regularly from her psychiatrist apparently was unable to get the prescription filled early as she received a 30-day supply on 12/19/2018.  Work-up here including lab work and chest x-ray are unremarkable.  Her vital signs are within normal limits.  Her tachycardia improves with rest and fluids.  I spoke to the patient regarding her work-up.  Patient is tearful, anxious stating that she feels that her anxiety is not controlled, as well as her diffuse bodyaches and shortness of breath related to COVID.  I offered to speak to the hospitalist regarding possible admission. Hospitalist does not feel that admission is this area at this time based on her reassuring work-up, vital signs and overall appearance.  Again informed patient that we are unable to fill her Xanax with that she should get  in touch with her psychiatrist and PCP.   Patient is hemodynamically stable, in NAD, and able to ambulate in the ED. Evaluation does not show pathology that would require ongoing emergent intervention or inpatient treatment. I explained the diagnosis to the patient. Pain has been managed and has no complaints prior to discharge. Patient is comfortable with above plan and is stable for discharge at this time. All questions were answered prior to disposition. Strict return precautions for returning to the ED were discussed. Encouraged follow up with PCP.   An After Visit Summary was printed and given to the patient.   Portions of this note were generated with [employer]. Dictation errors may occur despite best attempts at proofreading.        Delia Heady, PA-C 01/07/19 [place], MD 01/09/19 1007

## 2019-01-09 ENCOUNTER — Other Ambulatory Visit (HOSPITAL_COMMUNITY): Payer: Self-pay | Admitting: *Deleted

## 2019-01-09 NOTE — Telephone Encounter (Signed)
She will not get an early refill if she used up a whole one month supply in one week!!! No matter how high her anxiety is this is unacceptable and dangerously high dose per day - over 15 mg!

## 2019-01-09 NOTE — Telephone Encounter (Signed)
Agree. Will call pt and attempt more education.

## 2019-01-09 NOTE — Telephone Encounter (Signed)
Pt called asking to refill her Alprazolam 1mg  due to having been diagnosed with COVID 19 last week and needing to use extra. Writer explained medication was called in on 11/18 for 120 to be taken qid. Pt states that she only has a few left and her anxiety is increasing. Pt has spoken with [facility] and they advised calling MD. Please review and advise.

## 2019-01-10 ENCOUNTER — Encounter: Payer: Self-pay | Admitting: Family Medicine

## 2019-01-10 ENCOUNTER — Encounter (INDEPENDENT_AMBULATORY_CARE_PROVIDER_SITE_OTHER): Payer: Self-pay

## 2019-01-11 ENCOUNTER — Encounter (INDEPENDENT_AMBULATORY_CARE_PROVIDER_SITE_OTHER): Payer: Self-pay

## 2019-01-11 NOTE — Telephone Encounter (Signed)
Pt needs virtual only appt for Dr. Pamella Pert

## 2019-01-16 ENCOUNTER — Encounter: Payer: Self-pay | Admitting: Family Medicine

## 2019-01-16 ENCOUNTER — Other Ambulatory Visit: Payer: Self-pay

## 2019-01-16 ENCOUNTER — Telehealth (INDEPENDENT_AMBULATORY_CARE_PROVIDER_SITE_OTHER): Payer: No Typology Code available for payment source | Admitting: Family Medicine

## 2019-01-16 VITALS — BP 119/98 | Temp 97.6°F

## 2019-01-16 DIAGNOSIS — U071 COVID-19: Secondary | ICD-10-CM

## 2019-01-16 NOTE — Progress Notes (Signed)
Virtual Visit Note  I connected with patient on 01/16/19 at 234pm by phone and verified that I am speaking with the correct person using two identifiers. Jennifer Salazar is currently located at home and patient is currently with them during visit. The provider, Myles Lipps, MD is located in their office at time of visit.  I discussed the limitations, risks, security and privacy concerns of performing an evaluation and management service by telephone and the availability of in person appointments. I also discussed with the patient that there may be a patient responsible charge related to this service. The patient expressed understanding and agreed to proceed.   CC: covid positive  HPI Patient tested positive nov 16th, started having mild sx 3-4 days prior to test Her husband tested positive nov 1st Patient reports mild presentation other than getting dehydrated due to lack of appetite Seen in ER for vertigo a week ago She has been recovering well Denies fevers, URI sx, SOB Gets a bit nauseous when she eats a bit too much Has been out of work since her husband tested positive Anxiety better - sees psych on dec 2nd   ?   Allergies  Allergen Reactions  . Promethazine Other (See Comments)  . Benadryl [Diphenhydramine] Other (See Comments)    Internal restlessness  . Reglan [Metoclopramide] Other (See Comments)    Hyperactivity, skin crawled    Prior to Admission medications   Medication Sig Start Date End Date Taking? Authorizing Provider  ALPRAZolam Prudy Feeler) 1 MG tablet Take 1 tablet (1 mg total) by mouth 4 (four) times daily. Patient taking differently: Take 1 mg by mouth 4 (four) times daily as needed for anxiety.  01/04/19 02/03/19 Yes Pucilowski, Olgierd A, MD  calcium carbonate (TUMS - DOSED IN MG ELEMENTAL CALCIUM) 500 MG chewable tablet Chew 1 tablet by mouth 2 (two) times daily as needed for indigestion or heartburn.   Yes [provider]  levonorgestrel  (MIRENA) 20 MCG/24HR IUD 1 each by Intrauterine route once. Implanted winter 2017   Yes [provider]  lisinopril (ZESTRIL) 20 MG tablet TAKE 1 TABLET BY MOUTH TWICE A DAY Patient taking differently: Take 10 mg by mouth every morning.  09/29/18  Yes Myles Lipps, MD  meclizine (ANTIVERT) 25 MG tablet Take 1 tablet (25 mg total) by mouth 3 (three) times daily as needed for dizziness. 01/07/19  Yes Khatri, Hina, PA-C  sertraline (ZOLOFT) 100 MG tablet Take 1.5 tablets (150 mg total) by mouth daily. 12/19/18 03/19/19 Yes Pucilowski, Olgierd A, MD  tamsulosin (FLOMAX) 0.4 MG CAPS capsule Take 0.4 mg by mouth every morning.  09/15/18  Yes [provider]  albuterol (VENTOLIN HFA) 108 (90 Base) MCG/ACT inhaler Inhale 1 puff into the lungs every 6 (six) hours as needed for wheezing or shortness of breath. Patient not taking: Reported on 01/16/2019 01/07/19   Dietrich Pates, PA-C  oxyCODONE-acetaminophen (PERCOCET/ROXICET) 5-325 MG tablet Take 1-2 tablets by mouth every 4 (four) hours as needed for severe pain. Patient not taking: Reported on 01/16/2019 01/05/19   Lorre Nick, MD  traMADol (ULTRAM) 50 MG tablet Take 1 tablet (50 mg total) by mouth every 6 (six) hours as needed. Patient not taking: Reported on 01/16/2019 01/03/19   Reva Bores, MD  pantoprazole (PROTONIX) 40 MG tablet Take 1 tablet (40 mg total) by mouth daily. Patient not taking: Reported on 09/05/2018 06/16/18 09/05/18  Tressia Danas, MD  potassium chloride SA (K-DUR) 20 MEQ tablet Take 1 tablet (20  mEq total) by mouth daily. Patient not taking: Reported on 09/05/2018 07/03/18 09/05/18  Julianne Rice, MD    Past Medical History:  Diagnosis Date  . Anxiety   . Hypertension   . Ovarian cyst     Past Surgical History:  Procedure Laterality Date  . WISDOM TOOTH EXTRACTION      Social History   Tobacco Use  . Smoking status: Former Smoker    Packs/day: 0.25    Years: 10.00    Pack years: 2.50     Types: Cigarettes    Quit date: 11/08/2017    Years since quitting: 1.1  . Smokeless tobacco: Never Used  Substance Use Topics  . Alcohol use: Not Currently    Frequency: Never    Family History  Problem Relation Age of Onset  . Hypertension Mother   . COPD Father   . Crohn's disease Father   . Anxiety disorder Father   . Drug abuse Sister   . Anxiety disorder Brother     ROS Per hpi  Objective  Vitals as reported by the patient: none Gen: AAOx3, NAD Speaking in full sentences, breathing comfortably   ASSESSMENT and PLAN  1. Lab test positive for detection of COVID-19 virus Doing well. Afebrile. 14 day quarantine completed today. Able to return to work without restrictions  FOLLOW-UP: prn   The above assessment and management plan was discussed with the patient. The patient verbalized understanding of and has agreed to the management plan. Patient is aware to call the clinic if symptoms persist or worsen. Patient is aware when to return to the clinic for a follow-up visit. Patient educated on when it is appropriate to go to the emergency department.    I provided 15 minutes of non-face-to-face time during this encounter.  Rutherford Guys, MD [facility] [place], [place] [address] Ph.  [phone] Fax [phone]

## 2019-01-16 NOTE — Patient Instructions (Signed)
° ° ° °  If you have lab work done today you will be contacted with your lab results within the next 2 weeks.  If you have not heard from us then please contact us. The fastest way to get your results is to register for My Chart. ° ° °IF you received an x-ray today, you will receive an invoice from [facility]. Please contact [facility] at [phone] with questions or concerns regarding your invoice.  ° °IF you received labwork today, you will receive an invoice from [facility]. Please contact [facility] at [phone] with questions or concerns regarding your invoice.  ° °Our billing staff will not be able to assist you with questions regarding bills from these companies. ° °You will be contacted with the lab results as soon as they are available. The fastest way to get your results is to activate your My Chart account. Instructions are located on the last page of this paperwork. If you have not heard from us regarding the results in 2 weeks, please contact this office. °  ° ° ° °

## 2019-01-16 NOTE — Progress Notes (Signed)
POS for COVID- (husband nov 1st and 11/16 pt was pos) cabin fever (late October quarantine)  Sx now are tired and  Sore muscles.   What is the next step? Does she need to take another to be cleared to go back to work. (Works at [employer]) okay with going back to work slowly.

## 2019-01-18 ENCOUNTER — Other Ambulatory Visit: Payer: Self-pay

## 2019-01-18 ENCOUNTER — Ambulatory Visit (INDEPENDENT_AMBULATORY_CARE_PROVIDER_SITE_OTHER): Payer: No Typology Code available for payment source | Admitting: Psychiatry

## 2019-01-18 DIAGNOSIS — F411 Generalized anxiety disorder: Secondary | ICD-10-CM

## 2019-01-18 DIAGNOSIS — F410 Panic disorder [episodic paroxysmal anxiety] without agoraphobia: Secondary | ICD-10-CM | POA: Diagnosis not present

## 2019-01-18 MED ORDER — ALPRAZOLAM 1 MG PO TABS: 1.0000 mg | ORAL_TABLET | Freq: Three times a day (TID) | ORAL | 0 refills | Status: DC | PRN

## 2019-01-18 NOTE — Progress Notes (Signed)
BH MD/PA/NP OP Progress Note  01/18/2019 10:54 AM Jennifer LoopSarah Salazar  MRN:  [id number] Interview was conducted by phone and I verified that I was speaking with the correct person using two identifiers. I discussed the limitations of evaluation and management by telemedicine and  the availability of in person appointments. Patient expressed understanding and agreed to proceed.  Chief Complaint: Anxiety (but lower).  HPI: 38 yo married female with generalized and panic type anxiety. She has episodic panic attacks and excessive worrying (about multiple physical ailments/complaints -abdominal pain,vertigo,headaches, heartburn). No depression, no SI. She is onZoloft(150 mg)as well. In the past on 1 mg tid of alprazolam - more recently 0.5 mg prn up to 4 x daily.She was in [place] visiting her mother and upon return found out that yer husband tested positive for COVID (has mild symptoms) and then she tested positive as well. 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 Anxiety has subsided now and she is slowly decreasing dose of alprazolam. We plan to start more aggressive tapering down in January.  Visit Diagnosis:    ICD-10-CM   1. Generalized anxiety disorder  F41.1   2. Panic disorder  F41.0     Past Psychiatric History: Please see intake H&P.  Past Medical History:  Past Medical History:  Diagnosis Date  . Anxiety   . Hypertension   . Ovarian cyst     Past Surgical History:  Procedure Laterality Date  . WISDOM TOOTH EXTRACTION      Family Psychiatric History: Reviewed.  Family History:  Family History  Problem Relation Age of Onset  . Hypertension Mother   . COPD Father   . Crohn's disease Father   . Anxiety disorder Father   . Drug abuse Sister   . Anxiety disorder Brother     Social History:  Social History   Socioeconomic History  . Marital status: Married    Spouse name: Otilio Sabertravis smith  . Number of children: 0  . Years of education:  Not on file  . Highest education level: Some college, no degree  Occupational History  . Not on file  Social Needs  . Financial resource strain: Not hard at all  . Food insecurity    Worry: Never true    Inability: Never true  . Transportation needs    Medical: No    Non-medical: No  Tobacco Use  . Smoking status: Former Smoker    Packs/day: 0.25    Years: 10.00    Pack years: 2.50    Types: Cigarettes    Quit date: 11/08/2017    Years since quitting: 1.1  . Smokeless tobacco: Never Used  Substance and Sexual Activity  . Alcohol use: Not Currently    Frequency: Never  . Drug use: Never  . Sexual activity: Yes    Birth control/protection: I.U.D.  Lifestyle  . Physical activity    Days per week: 0 days    Minutes per session: 0 min  . Stress: Very much  Relationships  . Social Musicianconnections    Talks on phone: Not on file    Gets together: Not on file    Attends religious service: Never    Active member of club or organization: No    Attends meetings of clubs or organizations: Never    Relationship status: Married  Other Topics Concern  . Not on file  Social History Narrative  . Not on file    Allergies:  Allergies  Allergen Reactions  . Promethazine Other (See Comments)  . Benadryl [Diphenhydramine] Other (See Comments)    Internal restlessness  . Reglan [Metoclopramide] Other (See Comments)    Hyperactivity, skin crawled    Metabolic Disorder Labs: No results found for: HGBA1C, MPG No results found for: PROLACTIN Lab Results  Component Value Date   CHOL 191 03/22/2018   TRIG 92 03/22/2018   HDL 46 03/22/2018   CHOLHDL 4.2 03/22/2018   LDLCALC 127 (H) 03/22/2018   Lab Results  Component Value Date   TSH 1.700 07/08/2018   TSH 2.730 03/22/2018    Therapeutic Level Labs: No results found for: LITHIUM No results found for: VALPROATE No components found for:  CBMZ  Current Medications: Current Outpatient Medications  Medication Sig Dispense  Refill  . albuterol (VENTOLIN HFA) 108 (90 Base) MCG/ACT inhaler Inhale 1 puff into the lungs every 6 (six) hours as needed for wheezing or shortness of breath. (Patient not taking: Reported on 01/16/2019) 8 g 0  . [START ON 02/08/2019] ALPRAZolam (XANAX) 1 MG tablet Take 1 tablet (1 mg total) by mouth 3 (three) times daily as needed for anxiety. May take 1-2 extra per week. 90 tablet 0  . calcium carbonate (TUMS - DOSED IN MG ELEMENTAL CALCIUM) 500 MG chewable tablet Chew 1 tablet by mouth 2 (two) times daily as needed for indigestion or heartburn.    . levonorgestrel (MIRENA) 20 MCG/24HR IUD 1 each by Intrauterine route once. Implanted winter 2017    . lisinopril (ZESTRIL) 20 MG tablet TAKE 1 TABLET BY MOUTH TWICE A DAY (Patient taking differently: Take 10 mg by mouth every morning. ) 180 tablet 1  . meclizine (ANTIVERT) 25 MG tablet Take 1 tablet (25 mg total) by mouth 3 (three) times daily as needed for dizziness. 30 tablet 0  . oxyCODONE-acetaminophen (PERCOCET/ROXICET) 5-325 MG tablet Take 1-2 tablets by mouth every 4 (four) hours as needed for severe pain. (Patient not taking: Reported on 01/16/2019) 15 tablet 0  . sertraline (ZOLOFT) 100 MG tablet Take 1.5 tablets (150 mg total) by mouth daily. 135 tablet 0  . tamsulosin (FLOMAX) 0.4 MG CAPS capsule Take 0.4 mg by mouth every morning.     . traMADol (ULTRAM) 50 MG tablet Take 1 tablet (50 mg total) by mouth every 6 (six) hours as needed. (Patient not taking: Reported on 01/16/2019) 10 tablet 0   No current facility-administered medications for this visit.       Psychiatric Specialty Exam: Review of Systems  Psychiatric/Behavioral: The patient is nervous/anxious.   All other systems reviewed and are negative.   There were no vitals taken for this visit.There is no height or weight on file to calculate BMI.  General Appearance: NA  Eye Contact:  NA  Speech:  Clear and Coherent and Normal Rate  Volume:  Normal  Mood:  Anxious   Affect:  NA  Thought Process:  Goal Directed and Linear  Orientation:  Full (Time, Place, and Person)  Thought Content: Logical   Suicidal Thoughts:  No  Homicidal Thoughts:  No  Memory:  Immediate;   Good Recent;   Good Remote;   Good  Judgement:  Good  Insight:  Good  Psychomotor Activity:  NA  Concentration:  Concentration: Good  Recall:  Good  Fund of Knowledge: Good  Language: Good  Akathisia:  Negative  Handed:  Right  AIMS (if indicated): not done  Assets:  Communication Skills Desire for Improvement Financial Resources/Insurance Housing Resilience Social Support Talents/Skills  ADL's:  Intact  Cognition: WNL  Sleep:  Fair   Screenings: GAD-7     Office Visit from 07/08/2018 in [facility] [place] from 04/29/2018 in [place] at [place] from 03/28/2018 in [place] at [place] from 03/22/2018 in [place] at [facility]  Total GAD-7 Score  11  7  11  11     PHQ2-9     Office Visit from 07/28/2018 in [facility] at [place] from 07/08/2018 in [place] at [place] from 04/29/2018 in [place] at [place] from 03/28/2018 in [place] at [place] from 03/22/2018 in [place] at [facility] Total Score  0  3  1  0  2  PHQ-9 Total Score  6  12  8   -  8       Assessment and Plan: 38 yo married female with generalized and panic type anxiety. She has episodic panic attacks and excessive worrying (about multiple physical ailments/complaints -abdominal pain,vertigo,headaches, heartburn). No depression, no SI. She is onZoloft(150 mg)as well. In the past on 1 mg tid of alprazolam - more recently 0.5 mg prn up to 4 x daily.She was in [place] visiting her mother and upon return found out that yer husband tested positive for COVID (has mild symptoms) and then she tested positive as well. 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 Anxiety has subsided now  and she is slowly decreasing dose of alprazolam. We plan to start more aggressive tapering down in January.  Dx: GAD/Panic disorder  Plan: Continue alprazolam1 mg tid prnanxiety/sleepandZoloft to150 mg daily.The plan was discussed with patient who had an opportunity to ask questions and these were all answered. I spend25 minuteson the phonewith the patient.Next visit in6 weeks. The plan was discussed with patient who had an opportunity to ask questions and these were all answered. I spend 25 minutes in phone consultation with the patient.     Stephanie Acre, MD 01/18/2019, 10:54 AM

## 2019-02-01 ENCOUNTER — Other Ambulatory Visit: Payer: Self-pay

## 2019-02-01 ENCOUNTER — Encounter (HOSPITAL_COMMUNITY): Payer: Self-pay

## 2019-02-01 ENCOUNTER — Emergency Department (HOSPITAL_COMMUNITY)
Admission: EM | Admit: 2019-02-01 | Discharge: 2019-02-01 | Disposition: A | Discharge status: Home / Self Care | Payer: No Typology Code available for payment source | Attending: Emergency Medicine | Admitting: Emergency Medicine

## 2019-02-01 DIAGNOSIS — I10 Essential (primary) hypertension: Secondary | ICD-10-CM | POA: Insufficient documentation

## 2019-02-01 DIAGNOSIS — U071 COVID-19: Secondary | ICD-10-CM | POA: Insufficient documentation

## 2019-02-01 DIAGNOSIS — N3010 Interstitial cystitis (chronic) without hematuria: Secondary | ICD-10-CM | POA: Insufficient documentation

## 2019-02-01 DIAGNOSIS — F419 Anxiety disorder, unspecified: Secondary | ICD-10-CM | POA: Diagnosis not present

## 2019-02-01 DIAGNOSIS — Z87891 Personal history of nicotine dependence: Secondary | ICD-10-CM | POA: Insufficient documentation

## 2019-02-01 DIAGNOSIS — Z79899 Other long term (current) drug therapy: Secondary | ICD-10-CM | POA: Insufficient documentation

## 2019-02-01 DIAGNOSIS — R339 Retention of urine, unspecified: Secondary | ICD-10-CM | POA: Diagnosis present

## 2019-02-01 LAB — URINALYSIS, ROUTINE W REFLEX MICROSCOPIC
Bilirubin Urine: NEGATIVE
Glucose, UA: NEGATIVE mg/dL
Ketones, ur: NEGATIVE mg/dL
Leukocytes,Ua: NEGATIVE
Nitrite: NEGATIVE
Protein, ur: NEGATIVE mg/dL
Specific Gravity, Urine: 1.006 (ref 1.005–1.030)
pH: 5 (ref 5.0–8.0)

## 2019-02-01 LAB — POC URINE PREG, ED: Preg Test, Ur: NEGATIVE

## 2019-02-01 MED ORDER — PHENAZOPYRIDINE HCL 200 MG PO TABS: 200.0000 mg | ORAL_TABLET | Freq: Three times a day (TID) | ORAL | 0 refills | Status: AC | PRN

## 2019-02-01 MED ORDER — PHENAZOPYRIDINE HCL 100 MG PO TABS
200.0000 mg | ORAL_TABLET | Freq: Once | ORAL | Status: AC
  Administered 2019-02-01: 12:00:00 200 mg via ORAL
  Filled 2019-02-01: qty 2

## 2019-02-01 MED ORDER — TAMSULOSIN HCL 0.4 MG PO CAPS: 0.4000 mg | ORAL_CAPSULE | Freq: Every day | ORAL | 0 refills | Status: AC

## 2019-02-01 MED ORDER — LORAZEPAM 1 MG PO TABS
1.0000 mg | ORAL_TABLET | Freq: Once | ORAL | Status: AC
  Administered 2019-02-01: 14:00:00 1 mg via ORAL
  Filled 2019-02-01: qty 1

## 2019-02-01 NOTE — ED Triage Notes (Signed)
Patient complains of urinary retention describes as feeling like she isnt emptying her bladder. States hx of bladder problems and trying to over hydrate after having covid 1 month ago

## 2019-02-01 NOTE — ED Provider Notes (Signed)
[facility] EMERGENCY DEPARTMENT Provider Note   CSN: [id number] Arrival date & time: 02/01/19  1027     History No chief complaint on file.   Jennifer Salazar is a 38 y.o. female.  38 year old female with past medical history including hypertension, anxiety, bladder problems who presents with urinary retention.  Patient states that she has a longstanding history of urinary problems including difficulty voiding, voiding small amounts, and trouble urinating.  She denies any dysuria or hematuria.  She saw a urologist in the past who thought that she had interstitial cystitis.  She later followed with [facility] and they felt that her condition was potentially something different.  They have told her that symptoms are likely connected to anxiety and she agrees that symptoms are often worse when she is feeling more anxious.  In the past she has been on intravaginal Valium and Flomax but is not currently on these medications.  She took Pyridium a couple of days ago.  She reports that last month she had COVID-19 and her father died recently.  Since these events, she has had worsening anxiety which has also led to worsening bladder problems.  She started back to work recently which has also contributed to symptoms.  She notes that sometimes she has to sit on the toilet for 10 to 15 minutes before being able to empty her bladder.  She has chronic back pain.  She has occasional vomiting but this is not new.  No fevers.  The history is provided by the patient.       Past Medical History:  Diagnosis Date  . Anxiety   . Hypertension   . Ovarian cyst     Patient Active Problem List   Diagnosis Date Noted  . Hemorrhagic cyst of ovary 09/07/2018  . Panic disorder 04/26/2018  . Generalized anxiety disorder 03/26/2018  . Bladder problem 11/06/2016  . Hypertensive disorder 11/06/2016  . Gastroesophageal reflux disease without esophagitis 11/06/2016    Past Surgical  History:  Procedure Laterality Date  . WISDOM TOOTH EXTRACTION       OB History    Gravida  0   Para  0   Term  0   Preterm  0   AB  0   Living  0     SAB  0   TAB  0   Ectopic  0   Multiple  0   Live Births  0           Family History  Problem Relation Age of Onset  . Hypertension Mother   . COPD Father   . Crohn's disease Father   . Anxiety disorder Father   . Drug abuse Sister   . Anxiety disorder Brother     Social History   Tobacco Use  . Smoking status: Former Smoker    Packs/day: 0.25    Years: 10.00    Pack years: 2.50    Types: Cigarettes    Quit date: 11/08/2017    Years since quitting: 1.2  . Smokeless tobacco: Never Used  Substance Use Topics  . Alcohol use: Not Currently  . Drug use: Never    Home Medications Prior to Admission medications   Medication Sig Start Date End Date Taking? Authorizing Provider  albuterol (VENTOLIN HFA) 108 (90 Base) MCG/ACT inhaler Inhale 1 puff into the lungs every 6 (six) hours as needed for wheezing or shortness of breath. 01/07/19  Yes Khatri, Hina, PA-C  ALPRAZolam (XANAX) 1 MG  tablet Take 1 tablet (1 mg total) by mouth 3 (three) times daily as needed for anxiety. May take 1-2 extra per week. 02/08/19 03/10/19 Yes Pucilowski, Olgierd A, MD  calcium carbonate (TUMS - DOSED IN MG ELEMENTAL CALCIUM) 500 MG chewable tablet Chew 1 tablet by mouth 2 (two) times daily as needed for indigestion or heartburn.   Yes [provider]  lisinopril (ZESTRIL) 20 MG tablet TAKE 1 TABLET BY MOUTH TWICE A DAY Patient taking differently: Take 10 mg by mouth every morning.  09/29/18  Yes Rutherford Guys, MD  meclizine (ANTIVERT) 25 MG tablet Take 1 tablet (25 mg total) by mouth 3 (three) times daily as needed for dizziness. 01/07/19  Yes Khatri, Hina, PA-C  sertraline (ZOLOFT) 100 MG tablet Take 1.5 tablets (150 mg total) by mouth daily. 12/19/18 03/19/19 Yes Pucilowski, Marchia Bond, MD  levonorgestrel (MIRENA) 20  MCG/24HR IUD 1 each by Intrauterine route once. Implanted winter 2017    [provider]  phenazopyridine (PYRIDIUM) 200 MG tablet Take 1 tablet (200 mg total) by mouth 3 (three) times daily as needed for pain. 02/01/19   Coleta Grosshans, Wenda Overland, MD  tamsulosin (FLOMAX) 0.4 MG CAPS capsule Take 1 capsule (0.4 mg total) by mouth daily. 02/01/19   Alyss Granato, Wenda Overland, MD  pantoprazole (PROTONIX) 40 MG tablet Take 1 tablet (40 mg total) by mouth daily. Patient not taking: Reported on 09/05/2018 06/16/18 09/05/18  Thornton Park, MD  potassium chloride SA (K-DUR) 20 MEQ tablet Take 1 tablet (20 mEq total) by mouth daily. Patient not taking: Reported on 09/05/2018 07/03/18 09/05/18  Julianne Rice, MD    Allergies    Promethazine, Benadryl [diphenhydramine], and Reglan [metoclopramide]  Review of Systems   Review of Systems All other systems reviewed and are negative except that which was mentioned in HPI  Physical Exam Updated Vital Signs BP (!) 154/101 (BP Location: Right Arm)   Pulse (!) 107   Temp 98.1 F (36.7 C) (Oral)   Resp 18   Ht 5\' 2"  (1.575 m)   Wt 83 kg   SpO2 95%   BMI 33.47 kg/m   Physical Exam Vitals and nursing note reviewed.  Constitutional:      General: She is not in acute distress.    Appearance: She is well-developed.  HENT:     Head: Normocephalic and atraumatic.  Eyes:     Conjunctiva/sclera: Conjunctivae normal.  Cardiovascular:     Rate and Rhythm: Regular rhythm. Tachycardia present.  Pulmonary:     Effort: Pulmonary effort is normal.  Abdominal:     General: Abdomen is flat. There is no distension.     Palpations: Abdomen is soft.     Tenderness: There is abdominal tenderness (suprapubic).  Musculoskeletal:     Cervical back: Neck supple.  Skin:    General: Skin is warm and dry.  Neurological:     Mental Status: She is alert and oriented to person, place, and time.  Psychiatric:     Comments: Severely anxious and tearful     ED  Results / Procedures / Treatments   Labs (all labs ordered are listed, but only abnormal results are displayed) Labs Reviewed  URINALYSIS, ROUTINE W REFLEX MICROSCOPIC - Abnormal; Notable for the following components:      Result Value   Color, Urine STRAW (*)    APPearance HAZY (*)    Hgb urine dipstick MODERATE (*)    Bacteria, UA RARE (*)    All other components within  normal limits  URINE CULTURE  POC URINE PREG, ED    EKG None  Radiology No results found.  Procedures Procedures (including critical care time)  Medications Ordered in ED Medications  phenazopyridine (PYRIDIUM) tablet 200 mg (200 mg Oral Given 02/01/19 1221)  LORazepam (ATIVAN) tablet 1 mg (1 mg Oral Given 02/01/19 1356)    ED Course  I have reviewed the triage vital signs and the nursing notes.  Pertinent labs & imaging results that were available during my care of the patient were reviewed by me and considered in my medical decision making (see chart for details).    MDM Rules/Calculators/A&P                      Pt very anxious on exam. Post-void residual ~11680ml. Given that symptoms are chronic and she is often eventually able to empty her bladder, I recommended against indwelling Foley catheter given risk for infection and instead recommended restarting flomax and f/u with urology.  UA has no evidence of infection.  She understands reasons to return to ED including inability to urinate.  She also understands the importance of contacting urology clinic for close follow-up. Final Clinical Impression(s) / ED Diagnoses Final diagnoses:  Interstitial cystitis  Anxiety    Rx / DC Orders ED Discharge Orders         Ordered    tamsulosin (FLOMAX) 0.4 MG CAPS capsule  Daily     02/01/19 1342    phenazopyridine (PYRIDIUM) 200 MG tablet  3 times daily PRN     02/01/19 1342           Mcguire Gasparyan, Ambrose Finlandachel Morgan, MD 02/01/19 1611

## 2019-02-02 LAB — URINE CULTURE

## 2019-02-28 ENCOUNTER — Telehealth (HOSPITAL_COMMUNITY): Payer: Self-pay | Admitting: *Deleted

## 2019-02-28 ENCOUNTER — Other Ambulatory Visit (HOSPITAL_COMMUNITY): Payer: Self-pay | Admitting: Psychiatry

## 2019-02-28 MED ORDER — LORAZEPAM 1 MG PO TABS: 1.0000 mg | ORAL_TABLET | Freq: Three times a day (TID) | ORAL | 0 refills | Status: DC | PRN

## 2019-02-28 MED ORDER — ALPRAZOLAM 1 MG PO TABS: 1.0000 mg | ORAL_TABLET | Freq: Three times a day (TID) | ORAL | 0 refills | Status: DC | PRN

## 2019-02-28 NOTE — Telephone Encounter (Signed)
Writer spoke with pt regarding Xanax refill for 03/04/19. Pt began crying and hyperventilating stating "I can't do this". Pt said that she took last one last night thinking it would refill today. Pt cannot take Benadryl or Vistaril she says as she has a paradoxical effect with both. Writer spoke with Dr. Hinton Dyer who authorized Rx for Ativan 1mg  PO TiD times 10 days. Pt verbalizes understanding and was very appreciative and relieved.

## 2019-02-28 NOTE — Telephone Encounter (Signed)
Writer spoke with pt who is requesting a refill on the Xanax 1mg  TID may take 1 or 2 extra per week, last filled 02/08/19. Pt has an appointment on 03/07/19 and says she won't have enough to last until appointment. Pt was given 90 tabs on last refill. Please review.

## 2019-02-28 NOTE — Telephone Encounter (Signed)
She really should have enough to last her till her appointment but I ordered a refill one week earlier.

## 2019-03-02 ENCOUNTER — Other Ambulatory Visit: Payer: Self-pay | Admitting: Family Medicine

## 2019-03-02 ENCOUNTER — Other Ambulatory Visit: Payer: Self-pay

## 2019-03-02 NOTE — Telephone Encounter (Signed)
Forwarding medication refill request to the clinical pool for review. 

## 2019-03-07 ENCOUNTER — Ambulatory Visit (INDEPENDENT_AMBULATORY_CARE_PROVIDER_SITE_OTHER): Payer: No Typology Code available for payment source | Admitting: Psychiatry

## 2019-03-07 ENCOUNTER — Other Ambulatory Visit: Payer: Self-pay

## 2019-03-07 DIAGNOSIS — F411 Generalized anxiety disorder: Secondary | ICD-10-CM

## 2019-03-07 DIAGNOSIS — F410 Panic disorder [episodic paroxysmal anxiety] without agoraphobia: Secondary | ICD-10-CM | POA: Diagnosis not present

## 2019-03-07 MED ORDER — SERTRALINE HCL 100 MG PO TABS: 200.0000 mg | ORAL_TABLET | Freq: Every day | ORAL | 0 refills | Status: DC

## 2019-03-07 MED ORDER — LORAZEPAM 1 MG PO TABS: 1.0000 mg | ORAL_TABLET | Freq: Three times a day (TID) | ORAL | 0 refills | Status: DC | PRN

## 2019-03-07 NOTE — Progress Notes (Signed)
[facility] MD/PA/NP OP Progress Note  03/07/2019 10:47 AM Jennifer Salazar  MRN:  [id number] Interview was conducted by phone and I verified that I was speaking with the correct person using two identifiers. I discussed the limitations of evaluation and management by telemedicine and  the availability of in person appointments. Patient expressed understanding and agreed to proceed.  Chief Complaint: Anxiety.  HPI: 39 yo married female with generalized and panic type anxiety. She has episodic panic attacks and excessive worrying (about multiple physical ailments/complaints -abdominal pain,vertigo,headaches, heartburn). No depression, no SI. She is onZoloft(150 mg)as well. In the past on 1 mg tid of alprazolam -more recently0.5 mg prn up to 4 x daily.Shewas in [place] visiting her mother and upon return found out that yer husband tested positive for COVID (has mild symptoms) and then she tested positive as well. 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 She works Tuesdays and Wednesdays only for the time being. Anxiety has increased two weeks ago (aniversary of her father's passing) and she run out early of alprazolam. I send a short Rx for lorazepam and she feels it is more effective than Xanax particularly in helping her sleep longer.   Visit Diagnosis:    ICD-10-CM   1. Generalized anxiety disorder  F41.1   2. Panic disorder  F41.0     Past Psychiatric History: Please see intake H&P.  Past Medical History:  Past Medical History:  Diagnosis Date  . Anxiety   . Hypertension   . Ovarian cyst     Past Surgical History:  Procedure Laterality Date  . WISDOM TOOTH EXTRACTION      Family Psychiatric History: Reviewed.  Family History:  Family History  Problem Relation Age of Onset  . Hypertension Mother   . COPD Father   . Crohn's disease Father   . Anxiety disorder Father   . Drug abuse Sister   . Anxiety disorder Brother     Social History:   Social History   Socioeconomic History  . Marital status: Married    Spouse name: Tamala Ser  . Number of children: 0  . Years of education: Not on file  . Highest education level: Some college, no degree  Occupational History  . Not on file  Tobacco Use  . Smoking status: Former Smoker    Packs/day: 0.25    Years: 10.00    Pack years: 2.50    Types: Cigarettes    Quit date: 11/08/2017    Years since quitting: 1.3  . Smokeless tobacco: Never Used  Substance and Sexual Activity  . Alcohol use: Not Currently  . Drug use: Never  . Sexual activity: Yes    Birth control/protection: I.U.D.  Other Topics Concern  . Not on file  Social History Narrative  . Not on file   Social Determinants of Health   Financial Resource Strain: Low Risk   . Difficulty of Paying Living Expenses: Not hard at all  Food Insecurity: No Food Insecurity  . Worried About Charity fundraiser in the Last Year: Never true  . Ran Out of Food in the Last Year: Never true  Transportation Needs: No Transportation Needs  . Lack of Transportation (Medical): No  . Lack of Transportation (Non-Medical): No  Physical Activity: Inactive  . Days of Exercise per Week: 0 days  . Minutes of Exercise per Session: 0 min  Stress: Stress Concern Present  . Feeling of Stress : Very much  Social Connections: Unknown  . Frequency of Communication with Friends and Family: Not on file  . Frequency of Social Gatherings with Friends and Family: Not on file  . Attends Religious Services: Never  . Active Member of Clubs or Organizations: No  . Attends Banker Meetings: Never  . Marital Status: Married    Allergies:  Allergies  Allergen Reactions  . Promethazine Other (See Comments)  . Benadryl [Diphenhydramine] Other (See Comments)    Internal restlessness  . Reglan [Metoclopramide] Other (See Comments)    Hyperactivity, skin crawled    Metabolic Disorder Labs: No results found for: HGBA1C, MPG No  results found for: PROLACTIN Lab Results  Component Value Date   CHOL 191 03/22/2018   TRIG 92 03/22/2018   HDL 46 03/22/2018   CHOLHDL 4.2 03/22/2018   LDLCALC 127 (H) 03/22/2018   Lab Results  Component Value Date   TSH 1.700 07/08/2018   TSH 2.730 03/22/2018    Therapeutic Level Labs: No results found for: LITHIUM No results found for: VALPROATE No components found for:  CBMZ  Current Medications: Current Outpatient Medications  Medication Sig Dispense Refill  . albuterol (VENTOLIN HFA) 108 (90 Base) MCG/ACT inhaler Inhale 1 puff into the lungs every 6 (six) hours as needed for wheezing or shortness of breath. 8 g 0  . calcium carbonate (TUMS - DOSED IN MG ELEMENTAL CALCIUM) 500 MG chewable tablet Chew 1 tablet by mouth 2 (two) times daily as needed for indigestion or heartburn.    . levonorgestrel (MIRENA) 20 MCG/24HR IUD 1 each by Intrauterine route once. Implanted winter 2017    . lisinopril (ZESTRIL) 20 MG tablet TAKE 1 TABLET BY MOUTH TWICE A DAY 180 tablet 1  . LORazepam (ATIVAN) 1 MG tablet Take 1 tablet (1 mg total) by mouth every 8 (eight) hours as needed for up to 10 days for anxiety. 30 tablet 0  . meclizine (ANTIVERT) 25 MG tablet Take 1 tablet (25 mg total) by mouth 3 (three) times daily as needed for dizziness. 30 tablet 0  . phenazopyridine (PYRIDIUM) 200 MG tablet Take 1 tablet (200 mg total) by mouth 3 (three) times daily as needed for pain. 10 tablet 0  . sertraline (ZOLOFT) 100 MG tablet Take 1.5 tablets (150 mg total) by mouth daily. 135 tablet 0  . tamsulosin (FLOMAX) 0.4 MG CAPS capsule Take 1 capsule (0.4 mg total) by mouth daily. 14 capsule 0   No current facility-administered medications for this visit.    Psychiatric Specialty Exam: Review of Systems  Psychiatric/Behavioral: Positive for sleep disturbance. The patient is nervous/anxious.   All other systems reviewed and are negative.   There were no vitals taken for this visit.There is no height  or weight on file to calculate BMI.  General Appearance: NA  Eye Contact:  NA  Speech:  Clear and Coherent and Normal Rate  Volume:  Normal  Mood:  Anxious  Affect:  NA  Thought Process:  Goal Directed and Linear  Orientation:  Full (Time, Place, and Person)  Thought Content: Rumination   Suicidal Thoughts:  No  Homicidal Thoughts:  No  Memory:  Immediate;   Good Recent;   Good Remote;   Good  Judgement:  Good  Insight:  Good  Psychomotor Activity:  NA  Concentration:  Concentration: Good  Recall:  Good  Fund of Knowledge: Good  Language: Good  Akathisia:  Negative  Handed:  Right  AIMS (if indicated): not done  Assets:  Communication  Skills Desire for Improvement Financial Resources/Insurance Housing Resilience Social Support Talents/Skills  ADL's:  Intact  Cognition: WNL  Sleep:  Fair   Screenings: GAD-7     Office Visit from 07/08/2018 in Primary Care at [facility] Office Visit from 04/29/2018 in Primary Care at [facility] Visit from 03/28/2018 in Primary Care at [facility] Visit from 03/22/2018 in Primary Care at [facility]  Total GAD-7 Score  11  7  11  11     PHQ2-9     Office Visit from 07/28/2018 in Primary Care at [facility] Visit from 07/08/2018 in Primary Care at [facility] Visit from 04/29/2018 in Primary Care at [facility] Visit from 03/28/2018 in Primary Care at [facility] Visit from 03/22/2018 in Primary Care at [facility] Total Score  0  3  1  0  2  PHQ-9 Total Score  6  12  8   -  8       Assessment and Plan: 39 yo married female with generalized and panic type anxiety. She has episodic panic attacks and excessive worrying (about multiple physical ailments/complaints -abdominal pain,vertigo,headaches, heartburn). No depression, no SI. She is onZoloft(150 mg)as well. In the past on 1 mg tid of alprazolam -more recently0.5 mg prn up to 4 x daily.Shewas in visiting her mother and upon return found out that yer husband tested positive  for COVID (has mild symptoms) and then she tested positive as well. He is back at work and she can be relased to work 20) today. She works Tuesdays and Wednesdays only for the time being. Anxiety has increased two weeks ago (aniversary of her father's passing) and she run out early of alprazolam. I send a short Rx for lorazepam and she feels it is more effective than Xanax particularly in helping her sleep longer.   Dx: GAD/Panic disorder  Plan:Change alprazolamto lorazepam 1 mg tidprnanxiety/sleepat the time when next refill is due andincrease Zoloft to200 mg daily.The plan was discussed with patient who had an opportunity to ask questions and these were all answered. I spend25 minuteson the phonewith the patient.Next visit in2 months.The plan was discussed with patient who had an opportunity to ask questions and these were all answered. I spend 25 minutes in phone consultation with the patient.    [id number], MD 03/07/2019, 10:47 AM

## 2019-03-23 ENCOUNTER — Other Ambulatory Visit (HOSPITAL_COMMUNITY): Payer: Self-pay | Admitting: Psychiatry

## 2019-03-23 ENCOUNTER — Telehealth (HOSPITAL_COMMUNITY): Payer: Self-pay | Admitting: *Deleted

## 2019-03-23 MED ORDER — LORAZEPAM 1 MG PO TABS: 1.0000 mg | ORAL_TABLET | Freq: Three times a day (TID) | ORAL | 1 refills | Status: DC | PRN

## 2019-03-23 NOTE — Telephone Encounter (Signed)
It was for 30 but if she uses it  tid she soul be out by now. I reordered it.

## 2019-03-23 NOTE — Telephone Encounter (Signed)
Pt called requesting refill of Ativan 1mg . Pt states it's helping her more than the Xanax did. RX was last filled on 03/10/19. Pt says it was for #15 however I'm showing #30 on the last script filled on 03/10/19. Looked like maybe one was pended but pharmacy said there is not. Pt sig is to take one q8 hours prn. So if she did get #30 it is too early. Please review and advise.

## 2019-04-04 ENCOUNTER — Other Ambulatory Visit: Payer: Self-pay | Admitting: Family Medicine

## 2019-04-04 DIAGNOSIS — R102 Pelvic and perineal pain: Secondary | ICD-10-CM

## 2019-04-10 ENCOUNTER — Ambulatory Visit (INDEPENDENT_AMBULATORY_CARE_PROVIDER_SITE_OTHER): Payer: No Typology Code available for payment source | Admitting: Family Medicine

## 2019-04-10 ENCOUNTER — Encounter: Payer: Self-pay | Admitting: Family Medicine

## 2019-04-10 ENCOUNTER — Other Ambulatory Visit: Payer: Self-pay

## 2019-04-10 VITALS — BP 120/80 | HR 107 | Wt 193.6 lb

## 2019-04-10 DIAGNOSIS — Z1151 Encounter for screening for human papillomavirus (HPV): Secondary | ICD-10-CM | POA: Diagnosis not present

## 2019-04-10 DIAGNOSIS — N329 Bladder disorder, unspecified: Secondary | ICD-10-CM

## 2019-04-10 DIAGNOSIS — Z01419 Encounter for gynecological examination (general) (routine) without abnormal findings: Secondary | ICD-10-CM

## 2019-04-10 DIAGNOSIS — Z124 Encounter for screening for malignant neoplasm of cervix: Secondary | ICD-10-CM | POA: Diagnosis not present

## 2019-04-10 DIAGNOSIS — R102 Pelvic and perineal pain: Secondary | ICD-10-CM

## 2019-04-10 DIAGNOSIS — N83209 Unspecified ovarian cyst, unspecified side: Secondary | ICD-10-CM

## 2019-04-10 MED ORDER — GABAPENTIN 100 MG PO CAPS: 100.0000 mg | ORAL_CAPSULE | Freq: Three times a day (TID) | ORAL | 1 refills | Status: DC

## 2019-04-10 NOTE — Progress Notes (Signed)
   Subjective:    Patient ID: Jennifer Salazar is a 39 y.o. female presenting with pelvic pain on 04/10/2019  HPI: Long h/o pelvic pain and dysmenorrhea. Presumed endometriosis with primary MD. Does not want kids. Has Mirena. Had added aygestin and she took that for about 1-2 months and did not notice any difference. No longer taking this. Notes that she has long h/o severe pain with cycles. Pain is improved with IUD and no cycles. Now she is having cysts on her ovaries. Has IUD x 3.5-4 years. Also reports dyspareunia,   Review of Systems  Constitutional: Negative for chills and fever.  Respiratory: Negative for shortness of breath.   Cardiovascular: Negative for chest pain.  Gastrointestinal: Negative for abdominal pain, nausea and vomiting.  Genitourinary: Negative for dysuria.  Skin: Negative for rash.      Objective:    BP 120/80   Pulse (!) 107   Wt 193 lb 9.6 oz (87.8 kg)   BMI 35.41 kg/m  Physical Exam Constitutional:      General: She is not in acute distress.    Appearance: She is well-developed.  HENT:     Head: Normocephalic and atraumatic.  Eyes:     General: No scleral icterus. Cardiovascular:     Rate and Rhythm: Normal rate.  Pulmonary:     Effort: Pulmonary effort is normal.  Abdominal:     Palpations: Abdomen is soft.  Genitourinary:    Comments: BUS normal, vagina is pink and rugated, cervix is nulliparous without lesion, IUD strings noted uterus is small and anteverted, no adnexal mass, minimal tenderness.  Musculoskeletal:     Cervical back: Neck supple.  Skin:    General: Skin is warm and dry.  Neurological:     Mental Status: She is alert and oriented to person, place, and time.         Assessment & Plan:   Problem List Items Addressed This Visit      Unprioritized   Bladder problem    Has been diagnosed with interstitial cystitis and her Urologist here states it is pelvic floor pain/dysfunction and is going to pelvic PT      Hemorrhagic cyst of ovary    Resolved on last u/s      Pelvic pain    Offered diagnostic laparoscopy for definitive diagnosis, and discussed hysterectomy, Orilissa, and gabapentin. After consideration of everything, she elects for gabapentin. Will start low dose at hs, then increase. Once tolerating well, will increase to 300 mg up to tid prn. Still considering change out of her IUD, which worked better when 1st put in.       Relevant Medications   gabapentin (NEURONTIN) 100 MG capsule    Other Visit Diagnoses    Well woman exam with routine gynecological exam    -  Primary   Relevant Orders   Cytology - PAP( [facility])      Total face-to-face time with patient: 30 minutes. Over 50% of encounter was spent on counseling and coordination of care. Return in about 3 months (around 07/08/2019) for  virtual, a follow-up.  Reva Bores 04/10/2019 9:19 AM

## 2019-04-10 NOTE — Assessment & Plan Note (Signed)
Resolved on last u/s

## 2019-04-10 NOTE — Patient Instructions (Signed)
Preventive Care 21-39 Years Old, Female Preventive care refers to visits with your health care provider and lifestyle choices that can promote health and wellness. This includes:  A yearly physical exam. This may also be called an annual well check.  Regular dental visits and eye exams.  Immunizations.  Screening for certain conditions.  Healthy lifestyle choices, such as eating a healthy diet, getting regular exercise, not using drugs or products that contain nicotine and tobacco, and limiting alcohol use. What can I expect for my preventive care visit? Physical exam Your health care provider will check your:  Height and weight. This may be used to calculate body mass index (BMI), which tells if you are at a healthy weight.  Heart rate and blood pressure.  Skin for abnormal spots. Counseling Your health care provider may ask you questions about your:  Alcohol, tobacco, and drug use.  Emotional well-being.  Home and relationship well-being.  Sexual activity.  Eating habits.  Work and work environment.  Method of birth control.  Menstrual cycle.  Pregnancy history. What immunizations do I need?  Influenza (flu) vaccine  This is recommended every year. Tetanus, diphtheria, and pertussis (Tdap) vaccine  You may need a Td booster every 10 years. Varicella (chickenpox) vaccine  You may need this if you have not been vaccinated. Human papillomavirus (HPV) vaccine  If recommended by your health care provider, you may need three doses over 6 months. Measles, mumps, and rubella (MMR) vaccine  You may need at least one dose of MMR. You may also need a second dose. Meningococcal conjugate (MenACWY) vaccine  One dose is recommended if you are age 19-21 years and a first-year college student living in a residence hall, or if you have one of several medical conditions. You may also need additional booster doses. Pneumococcal conjugate (PCV13) vaccine  You may need  this if you have certain conditions and were not previously vaccinated. Pneumococcal polysaccharide (PPSV23) vaccine  You may need one or two doses if you smoke cigarettes or if you have certain conditions. Hepatitis A vaccine  You may need this if you have certain conditions or if you travel or work in places where you may be exposed to hepatitis A. Hepatitis B vaccine  You may need this if you have certain conditions or if you travel or work in places where you may be exposed to hepatitis B. Haemophilus influenzae type b (Hib) vaccine  You may need this if you have certain conditions. You may receive vaccines as individual doses or as more than one vaccine together in one shot (combination vaccines). Talk with your health care provider about the risks and benefits of combination vaccines. What tests do I need?  Blood tests  Lipid and cholesterol levels. These may be checked every 5 years starting at age 20.  Hepatitis C test.  Hepatitis B test. Screening  Diabetes screening. This is done by checking your blood sugar (glucose) after you have not eaten for a while (fasting).  Sexually transmitted disease (STD) testing.  BRCA-related cancer screening. This may be done if you have a family history of breast, ovarian, tubal, or peritoneal cancers.  Pelvic exam and Pap test. This may be done every 3 years starting at age 21. Starting at age 30, this may be done every 5 years if you have a Pap test in combination with an HPV test. Talk with your health care provider about your test results, treatment options, and if necessary, the need for more tests.   Follow these instructions at home: Eating and drinking   Eat a diet that includes fresh fruits and vegetables, whole grains, lean protein, and low-fat dairy.  Take vitamin and mineral supplements as recommended by your health care provider.  Do not drink alcohol if: ? Your health care provider tells you not to drink. ? You are  pregnant, may be pregnant, or are planning to become pregnant.  If you drink alcohol: ? Limit how much you have to 0-1 drink a day. ? Be aware of how much alcohol is in your drink. In the [place] one drink equals one 12 oz bottle of beer (355 mL), one 5 oz glass of wine (148 mL), or one 1 oz glass of hard liquor (44 mL). Lifestyle  Take daily care of your teeth and gums.  Stay active. Exercise for at least 30 minutes on 5 or more days each week.  Do not use any products that contain nicotine or tobacco, such as cigarettes, e-cigarettes, and chewing tobacco. If you need help quitting, ask your health care provider.  If you are sexually active, practice safe sex. Use a condom or other form of birth control (contraception) in order to prevent pregnancy and STIs (sexually transmitted infections). If you plan to become pregnant, see your health care provider for a preconception visit. What's next?  Visit your health care provider once a year for a well check visit.  Ask your health care provider how often you should have your eyes and teeth checked.  Stay up to date on all vaccines. This information is not intended to replace advice given to you by your health care provider. Make sure you discuss any questions you have with your health care provider. Document Revised: 10/14/2017 Document Reviewed: 10/14/2017 Elsevier Patient Education  2020 [employer].

## 2019-04-10 NOTE — Assessment & Plan Note (Signed)
Has been diagnosed with interstitial cystitis and her Urologist here states it is pelvic floor pain/dysfunction and is going to pelvic PT

## 2019-04-10 NOTE — Assessment & Plan Note (Addendum)
Offered diagnostic laparoscopy for definitive diagnosis, and discussed hysterectomy, Orilissa, and gabapentin. After consideration of everything, she elects for gabapentin. Will start low dose at hs, then increase. Once tolerating well, will increase to 300 mg up to tid prn. Still considering change out of her IUD, which worked better when 1st put in.

## 2019-04-12 ENCOUNTER — Other Ambulatory Visit: Payer: Self-pay | Admitting: Family Medicine

## 2019-04-12 LAB — CYTOLOGY - PAP
Comment: NEGATIVE
Diagnosis: NEGATIVE
High risk HPV: NEGATIVE

## 2019-04-12 MED ORDER — FLUCONAZOLE 150 MG PO TABS: 150.0000 mg | ORAL_TABLET | Freq: Every day | ORAL | 2 refills | Status: DC

## 2019-04-13 ENCOUNTER — Telehealth (HOSPITAL_COMMUNITY): Payer: Self-pay | Admitting: *Deleted

## 2019-04-13 NOTE — Telephone Encounter (Signed)
I am OK with that - juts I wish she can be more specific how many she would need?

## 2019-04-13 NOTE — Telephone Encounter (Signed)
Pt called asking if we could call in a few Xanax for her as she is in [place]; her best friend died. While the Ativan is working great she says at this moment she feels she needs something more to get through this situation. Please review and advise.

## 2019-04-14 ENCOUNTER — Other Ambulatory Visit (HOSPITAL_COMMUNITY): Payer: Self-pay | Admitting: *Deleted

## 2019-04-14 MED ORDER — ALPRAZOLAM 1 MG PO TABS: 0.5000 mg | ORAL_TABLET | Freq: Three times a day (TID) | ORAL | 0 refills | Status: DC | PRN

## 2019-04-14 MED ORDER — ALPRAZOLAM 1 MG PO TABS: 0.5000 mg | ORAL_TABLET | Freq: Three times a day (TID) | ORAL | 0 refills | Status: AC | PRN

## 2019-04-18 ENCOUNTER — Telehealth (HOSPITAL_COMMUNITY): Payer: Self-pay | Admitting: *Deleted

## 2019-04-18 NOTE — Telephone Encounter (Signed)
Patient should have 3 more weeks of alprazolam left on current prescription - way too early to get "extra"

## 2019-04-18 NOTE — Telephone Encounter (Signed)
Pt left message for this nurse asking if we would call in "a few" Xanax as the Rx I called in for Xanax 1mg , #9 to [facility] in on 04/14/19 was unable to be filled she says. Pt stated that [facility] "hates me" and "what am I doing wrong"? Pt is currently taking Ativan 1mg  prn. FYI pt has called previously asking for additional medication.

## 2019-05-01 ENCOUNTER — Other Ambulatory Visit: Payer: Self-pay

## 2019-05-01 DIAGNOSIS — R102 Pelvic and perineal pain: Secondary | ICD-10-CM

## 2019-05-01 MED ORDER — TRAMADOL HCL 50 MG PO TABS: 50.0000 mg | ORAL_TABLET | Freq: Four times a day (QID) | ORAL | 0 refills | Status: DC | PRN

## 2019-05-01 MED ORDER — GABAPENTIN 100 MG PO CAPS: 100.0000 mg | ORAL_CAPSULE | Freq: Three times a day (TID) | ORAL | 1 refills | Status: DC

## 2019-05-04 ENCOUNTER — Other Ambulatory Visit: Payer: Self-pay

## 2019-05-04 ENCOUNTER — Ambulatory Visit (INDEPENDENT_AMBULATORY_CARE_PROVIDER_SITE_OTHER): Payer: No Typology Code available for payment source | Admitting: Psychiatry

## 2019-05-04 DIAGNOSIS — F411 Generalized anxiety disorder: Secondary | ICD-10-CM | POA: Diagnosis not present

## 2019-05-04 DIAGNOSIS — F410 Panic disorder [episodic paroxysmal anxiety] without agoraphobia: Secondary | ICD-10-CM

## 2019-05-04 MED ORDER — LORAZEPAM 1 MG PO TABS: 1.0000 mg | ORAL_TABLET | Freq: Three times a day (TID) | ORAL | 1 refills | Status: DC | PRN

## 2019-05-04 NOTE — Progress Notes (Signed)
[facility] MD/PA/NP OP Progress Note  05/04/2019 9:47 AM Jennifer Salazar  MRN:  [id number] Interview was conducted by phone and I verified that I was speaking with the correct person using two identifiers. I discussed the limitations of evaluation and management by telemedicine and  the availability of in person appointments. Patient expressed understanding and agreed to proceed.  Chief Complaint: Anxiety, occasional insomnia.  HPI: 39 yo married female with generalized and panic type anxiety. She has episodic panic attacks and excessive worrying (about multiple physical ailments/complaints -abdominal pain,vertigo,headaches, heartburn). No depression, no SI. She is onZoloft(200 mg)as well. In the past on 1 mg tid of alprazolam -recentlychanged to 1 mg tid of lorazepam as it is less sedating yet helps more with occasional insomnia. She is also prescribed low dose of gabapentin for pain by her OB-GYN which should also help with anxiety and sleep. She is back at work at [employer] 4 days a week.     Visit Diagnosis:    ICD-10-CM   1. Panic disorder  F41.0   2. Generalized anxiety disorder  F41.1     Past Psychiatric History: Please see intake H&P.  Past Medical History:  Past Medical History:  Diagnosis Date  . Anxiety   . Hypertension   . Ovarian cyst     Past Surgical History:  Procedure Laterality Date  . WISDOM TOOTH EXTRACTION      Family Psychiatric History: Reviewed.  Family History:  Family History  Problem Relation Age of Onset  . Hypertension Mother   . COPD Father   . Crohn's disease Father   . Anxiety disorder Father   . Drug abuse Sister   . Anxiety disorder Brother     Social History:  Social History   Socioeconomic History  . Marital status: Married    Spouse name: Tamala Ser  . Number of children: 0  . Years of education: Not on file  . Highest education level: Some college, no degree  Occupational History  . Not on file  Tobacco Use  .  Smoking status: Former Smoker    Packs/day: 0.25    Years: 10.00    Pack years: 2.50    Types: Cigarettes    Quit date: 11/08/2017    Years since quitting: 1.4  . Smokeless tobacco: Never Used  Substance and Sexual Activity  . Alcohol use: Not Currently  . Drug use: Never  . Sexual activity: Yes    Birth control/protection: I.U.D.  Other Topics Concern  . Not on file  Social History Narrative  . Not on file   Social Determinants of Health   Financial Resource Strain:   . Difficulty of Paying Living Expenses:   Food Insecurity:   . Worried About Charity fundraiser in the Last Year:   . Arboriculturist in the Last Year:   Transportation Needs:   . Film/video editor (Medical):   Marland Kitchen Lack of Transportation (Non-Medical):   Physical Activity:   . Days of Exercise per Week:   . Minutes of Exercise per Session:   Stress:   . Feeling of Stress :   Social Connections:   . Frequency of Communication with Friends and Family:   . Frequency of Social Gatherings with Friends and Family:   . Attends Religious Services:   . Active Member of Clubs or Organizations:   . Attends Archivist Meetings:   Marland Kitchen Marital Status:     Allergies:  Allergies  Allergen Reactions  .  Promethazine Other (See Comments)  . Benadryl [Diphenhydramine] Other (See Comments)    Internal restlessness  . Reglan [Metoclopramide] Other (See Comments)    Hyperactivity, skin crawled    Metabolic Disorder Labs: No results found for: HGBA1C, MPG No results found for: PROLACTIN Lab Results  Component Value Date   CHOL 191 03/22/2018   TRIG 92 03/22/2018   HDL 46 03/22/2018   CHOLHDL 4.2 03/22/2018   LDLCALC 127 (H) 03/22/2018   Lab Results  Component Value Date   TSH 1.700 07/08/2018   TSH 2.730 03/22/2018    Therapeutic Level Labs: No results found for: LITHIUM No results found for: VALPROATE No components found for:  CBMZ  Current Medications: Current Outpatient Medications   Medication Sig Dispense Refill  . albuterol (VENTOLIN HFA) 108 (90 Base) MCG/ACT inhaler Inhale 1 puff into the lungs every 6 (six) hours as needed for wheezing or shortness of breath. (Patient not taking: Reported on 04/10/2019) 8 g 0  . ALPRAZolam (XANAX) 1 MG tablet Take 0.5-1 tablets (0.5-1 mg total) by mouth 3 (three) times daily as needed for anxiety or sleep. 9 tablet 0  . calcium carbonate (TUMS - DOSED IN MG ELEMENTAL CALCIUM) 500 MG chewable tablet Chew 1 tablet by mouth 2 (two) times daily as needed for indigestion or heartburn.    . fluconazole (DIFLUCAN) 150 MG tablet Take 1 tablet (150 mg total) by mouth daily. Repeat in 24 hours if needed 2 tablet 2  . gabapentin (NEURONTIN) 100 MG capsule Take 1 capsule (100 mg total) by mouth 3 (three) times daily. May use 2 at bedtime 180 capsule 1  . levonorgestrel (MIRENA) 20 MCG/24HR IUD 1 each by Intrauterine route once. Implanted winter 2017    . lisinopril (ZESTRIL) 20 MG tablet TAKE 1 TABLET BY MOUTH TWICE A DAY 180 tablet 1  . [START ON 05/21/2019] LORazepam (ATIVAN) 1 MG tablet Take 1 tablet (1 mg total) by mouth every 8 (eight) hours as needed for anxiety. 90 tablet 1  . meclizine (ANTIVERT) 25 MG tablet Take 1 tablet (25 mg total) by mouth 3 (three) times daily as needed for dizziness. (Patient not taking: Reported on 04/10/2019) 30 tablet 0  . phenazopyridine (PYRIDIUM) 200 MG tablet Take 1 tablet (200 mg total) by mouth 3 (three) times daily as needed for pain. 10 tablet 0  . sertraline (ZOLOFT) 100 MG tablet Take 2 tablets (200 mg total) by mouth daily. 180 tablet 0  . tamsulosin (FLOMAX) 0.4 MG CAPS capsule Take 1 capsule (0.4 mg total) by mouth daily. 14 capsule 0  . traMADol (ULTRAM) 50 MG tablet Take 1 tablet (50 mg total) by mouth every 6 (six) hours as needed. 10 tablet 0   No current facility-administered medications for this visit.     Psychiatric Specialty Exam: Review of Systems  Gastrointestinal: Positive for abdominal  pain.  Psychiatric/Behavioral: Positive for sleep disturbance. The patient is nervous/anxious.   All other systems reviewed and are negative.   There were no vitals taken for this visit.There is no height or weight on file to calculate BMI.  General Appearance: NA  Eye Contact:  NA  Speech:  Clear and Coherent and Normal Rate  Volume:  Normal  Mood:  Anxious  Affect:  NA  Thought Process:  Goal Directed and Linear  Orientation:  Full (Time, Place, and Person)  Thought Content: Rumination   Suicidal Thoughts:  No  Homicidal Thoughts:  No  Memory:  Immediate;   Good Recent;  Good Remote;   Good  Judgement:  Good  Insight:  Fair  Psychomotor Activity:  NA  Concentration:  Concentration: Good  Recall:  Good  Fund of Knowledge: Good  Language: Good  Akathisia:  Negative  Handed:  Right  AIMS (if indicated): not done  Assets:  Communication Skills Desire for Improvement Financial Resources/Insurance Housing Social Support Talents/Skills  ADL's:  Intact  Cognition: WNL  Sleep:  Fair   Screenings: GAD-7     Office Visit from 04/10/2019 in Center for [facility] Office Visit from 07/08/2018 in Primary Care at [facility] Visit from 04/29/2018 in Primary Care at [facility] Visit from 03/28/2018 in Primary Care at [facility] Visit from 03/22/2018 in Primary Care at [facility]  Total GAD-7 Score  7  11  7  11  11     PHQ2-9     Office Visit from 04/10/2019 in Center for [facility] Office Visit from 07/28/2018 in Primary Care at [facility] Visit from 07/08/2018 in Primary Care at [facility] Visit from 04/29/2018 in Primary Care at [facility] Visit from 03/28/2018 in Primary Care at [facility] Total Score  2  0  3  1  0  PHQ-9 Total Score  5  6  12  8   --       Assessment and Plan: 39 yo married female with generalized and panic type anxiety. She has episodic panic attacks and excessive worrying (about multiple physical  ailments/complaints -abdominal pain,vertigo,headaches, heartburn). No depression, no SI. She is onZoloft(200 mg)as well. In the past on 1 mg tid of alprazolam -recentlychanged to 1 mg tid of lorazepam as it is less sedating yet helps more with occasional insomnia. She is also prescribed low dose of gabapentin for pain by her OB-GYN which should also help with anxiety and sleep. She is back at work at 4 days a week.    Dx: GAD/Panic disorder  Plan:Continue lorazepam 1 mg tidprnanxiety/sleepat the time and Zoloft 200 mg daily.The plan was discussed with patient who had an opportunity to ask questions and these were all answered. I spend20 minuteson the phonewith the patient.Next visit in2 months.The plan was discussed with patient who had an opportunity to ask questions and these were all answered. I spend20 minutes inphone consultation with the patient.    24, MD 05/04/2019, 9:47 AM

## 2019-05-08 ENCOUNTER — Ambulatory Visit: Payer: No Typology Code available for payment source | Admitting: Family Medicine

## 2019-05-08 ENCOUNTER — Encounter: Payer: Self-pay | Admitting: Family Medicine

## 2019-05-08 NOTE — Progress Notes (Signed)
Patient did not keep appointment today. She may call to reschedule.  

## 2019-05-22 ENCOUNTER — Other Ambulatory Visit: Payer: Self-pay | Admitting: Family Medicine

## 2019-05-22 DIAGNOSIS — R102 Pelvic and perineal pain: Secondary | ICD-10-CM

## 2019-05-26 ENCOUNTER — Other Ambulatory Visit: Payer: Self-pay

## 2019-05-26 DIAGNOSIS — R102 Pelvic and perineal pain: Secondary | ICD-10-CM

## 2019-05-30 ENCOUNTER — Encounter: Payer: Self-pay | Admitting: *Deleted

## 2019-05-30 MED ORDER — GABAPENTIN 300 MG PO CAPS: 300.0000 mg | ORAL_CAPSULE | Freq: Three times a day (TID) | ORAL | 2 refills | Status: DC

## 2019-05-30 MED ORDER — TRAMADOL HCL 50 MG PO TABS: 50.0000 mg | ORAL_TABLET | Freq: Four times a day (QID) | ORAL | 0 refills | Status: DC | PRN

## 2019-06-02 ENCOUNTER — Other Ambulatory Visit (HOSPITAL_COMMUNITY): Payer: Self-pay | Admitting: Psychiatry

## 2019-06-15 ENCOUNTER — Other Ambulatory Visit: Payer: Self-pay

## 2019-06-15 DIAGNOSIS — R102 Pelvic and perineal pain: Secondary | ICD-10-CM

## 2019-06-15 MED ORDER — TRAMADOL HCL 50 MG PO TABS: 50.0000 mg | ORAL_TABLET | Freq: Four times a day (QID) | ORAL | 0 refills | Status: DC | PRN

## 2019-06-29 ENCOUNTER — Telehealth (INDEPENDENT_AMBULATORY_CARE_PROVIDER_SITE_OTHER): Payer: No Typology Code available for payment source | Admitting: Psychiatry

## 2019-06-29 ENCOUNTER — Other Ambulatory Visit: Payer: Self-pay

## 2019-06-29 DIAGNOSIS — F411 Generalized anxiety disorder: Secondary | ICD-10-CM

## 2019-06-29 DIAGNOSIS — F410 Panic disorder [episodic paroxysmal anxiety] without agoraphobia: Secondary | ICD-10-CM | POA: Diagnosis not present

## 2019-06-29 MED ORDER — ALPRAZOLAM 0.5 MG PO TABS: 0.5000 mg | ORAL_TABLET | Freq: Two times a day (BID) | ORAL | 2 refills | Status: DC | PRN

## 2019-06-29 MED ORDER — SERTRALINE HCL 100 MG PO TABS: 200.0000 mg | ORAL_TABLET | Freq: Every day | ORAL | 0 refills | Status: DC

## 2019-06-29 NOTE — Progress Notes (Signed)
[facility] MD/PA/NP OP Progress Note  06/29/2019 8:41 AM Jennifer Salazar  MRN:  [id number] Interview was conducted by phone and I verified that I was speaking with the correct person using two identifiers. I discussed the limitations of evaluation and management by telemedicine and  the availability of in person appointments. Patient expressed understanding and agreed to proceed.  Chief Complaint: "I am doing great".  HPI: 39 yo married female with generalized and panic type anxiety. She has episodic panic attacks and excessive worrying (about multiple physical ailments/complaints -abdominal pain,vertigo,headaches, heartburn). No depression, no SI. She is onZoloft(200 mg)as well. In the past on 1 mg tid of alprazolam -recentlychanged to 1 mg tid of lorazepam as it is less sedating yet helps more with occasional insomnia. She is also prescribed low dose of gabapentin for pain by her OB-GYN which should also help with anxiety and sleep. She is back at work at [employer] full time. She feels less anxious, keeps diet, lost 20 lbs. She would like to go back to alprazolam as it worked faster and wants to try a lower dose. Goal would be to gradually come off all her antianxiety meds.    Visit Diagnosis:    ICD-10-CM   1. Panic disorder  F41.0   2. Generalized anxiety disorder  F41.1     Past Psychiatric History: Please see intake H&P.  Past Medical History:  Past Medical History:  Diagnosis Date  . Anxiety   . Hypertension   . Ovarian cyst     Past Surgical History:  Procedure Laterality Date  . WISDOM TOOTH EXTRACTION      Family Psychiatric History: Reviewed.  Family History:  Family History  Problem Relation Age of Onset  . Hypertension Mother   . COPD Father   . Crohn's disease Father   . Anxiety disorder Father   . Drug abuse Sister   . Anxiety disorder Brother     Social History:  Social History   Socioeconomic History  . Marital status: Married    Spouse name:  Tamala Ser  . Number of children: 0  . Years of education: Not on file  . Highest education level: Some college, no degree  Occupational History  . Not on file  Tobacco Use  . Smoking status: Former Smoker    Packs/day: 0.25    Years: 10.00    Pack years: 2.50    Types: Cigarettes    Quit date: 11/08/2017    Years since quitting: 1.6  . Smokeless tobacco: Never Used  Substance and Sexual Activity  . Alcohol use: Not Currently  . Drug use: Never  . Sexual activity: Yes    Birth control/protection: I.U.D.  Other Topics Concern  . Not on file  Social History Narrative  . Not on file   Social Determinants of Health   Financial Resource Strain:   . Difficulty of Paying Living Expenses:   Food Insecurity:   . Worried About Charity fundraiser in the Last Year:   . Arboriculturist in the Last Year:   Transportation Needs:   . Film/video editor (Medical):   Marland Kitchen Lack of Transportation (Non-Medical):   Physical Activity:   . Days of Exercise per Week:   . Minutes of Exercise per Session:   Stress:   . Feeling of Stress :   Social Connections:   . Frequency of Communication with Friends and Family:   . Frequency of Social Gatherings with Friends and Family:   .  Attends Religious Services:   . Active Member of Clubs or Organizations:   . Attends Banker Meetings:   Marland Kitchen Marital Status:     Allergies:  Allergies  Allergen Reactions  . Promethazine Other (See Comments)  . Benadryl [Diphenhydramine] Other (See Comments)    Internal restlessness  . Reglan [Metoclopramide] Other (See Comments)    Hyperactivity, skin crawled    Metabolic Disorder Labs: No results found for: HGBA1C, MPG No results found for: PROLACTIN Lab Results  Component Value Date   CHOL 191 03/22/2018   TRIG 92 03/22/2018   HDL 46 03/22/2018   CHOLHDL 4.2 03/22/2018   LDLCALC 127 (H) 03/22/2018   Lab Results  Component Value Date   TSH 1.700 07/08/2018   TSH 2.730 03/22/2018     Therapeutic Level Labs: No results found for: LITHIUM No results found for: VALPROATE No components found for:  CBMZ  Current Medications: Current Outpatient Medications  Medication Sig Dispense Refill  . albuterol (VENTOLIN HFA) 108 (90 Base) MCG/ACT inhaler Inhale 1 puff into the lungs every 6 (six) hours as needed for wheezing or shortness of breath. (Patient not taking: Reported on 04/10/2019) 8 g 0  . ALPRAZolam (XANAX) 0.5 MG tablet Take 1 tablet (0.5 mg total) by mouth 2 (two) times daily as needed for anxiety. 60 tablet 2  . calcium carbonate (TUMS - DOSED IN MG ELEMENTAL CALCIUM) 500 MG chewable tablet Chew 1 tablet by mouth 2 (two) times daily as needed for indigestion or heartburn.    . fluconazole (DIFLUCAN) 150 MG tablet Take 1 tablet (150 mg total) by mouth daily. Repeat in 24 hours if needed 2 tablet 2  . gabapentin (NEURONTIN) 300 MG capsule Take 1 capsule (300 mg total) by mouth 3 (three) times daily. May use 2 at bedtime 180 capsule 2  . levonorgestrel (MIRENA) 20 MCG/24HR IUD 1 each by Intrauterine route once. Implanted winter 2017    . lisinopril (ZESTRIL) 20 MG tablet TAKE 1 TABLET BY MOUTH TWICE A DAY 180 tablet 1  . meclizine (ANTIVERT) 25 MG tablet Take 1 tablet (25 mg total) by mouth 3 (three) times daily as needed for dizziness. (Patient not taking: Reported on 04/10/2019) 30 tablet 0  . phenazopyridine (PYRIDIUM) 200 MG tablet Take 1 tablet (200 mg total) by mouth 3 (three) times daily as needed for pain. 10 tablet 0  . sertraline (ZOLOFT) 100 MG tablet Take 2 tablets (200 mg total) by mouth daily. 180 tablet 0  . tamsulosin (FLOMAX) 0.4 MG CAPS capsule Take 1 capsule (0.4 mg total) by mouth daily. 14 capsule 0  . traMADol (ULTRAM) 50 MG tablet Take 1 tablet (50 mg total) by mouth every 6 (six) hours as needed. 10 tablet 0   No current facility-administered medications for this visit.     Psychiatric Specialty Exam: Review of Systems  Neurological: Seizures:  Less anxious.  Psychiatric/Behavioral: The patient is nervous/anxious.   All other systems reviewed and are negative.   There were no vitals taken for this visit.There is no height or weight on file to calculate BMI.  General Appearance: NA  Eye Contact:  NA  Speech:  Clear and Coherent and Normal Rate  Volume:  Normal  Mood:  Less anxious.  Affect:  NA  Thought Process:  Goal Directed and Linear  Orientation:  Full (Time, Place, and Person)  Thought Content: Logical   Suicidal Thoughts:  No  Homicidal Thoughts:  No  Memory:  Immediate;   Good  Recent;   Good Remote;   Good  Judgement:  Good  Insight:  Good  Psychomotor Activity:  NA  Concentration:  Concentration: Good  Recall:  Good  Fund of Knowledge: Good  Language: Good  Akathisia:  Negative  Handed:  Right  AIMS (if indicated): not done  Assets:  Desire for Improvement Financial Resources/Insurance Housing Intimacy Social Support Talents/Skills  ADL's:  Intact  Cognition: WNL  Sleep:  Fair   Screenings: GAD-7     Office Visit from 04/10/2019 in Center for [facility] Office Visit from 07/08/2018 in Primary Care at [facility] Visit from 04/29/2018 in Primary Care at [facility] Visit from 03/28/2018 in Primary Care at [facility] Visit from 03/22/2018 in Primary Care at [facility]  Total GAD-7 Score  7  11  7  11  11     PHQ2-9     Office Visit from 04/10/2019 in Center for [facility] Office Visit from 07/28/2018 in Primary Care at [facility] Visit from 07/08/2018 in Primary Care at [facility] Visit from 04/29/2018 in Primary Care at [facility] Visit from 03/28/2018 in Primary Care at [facility] Total Score  2  0  3  1  0  PHQ-9 Total Score  5  6  12  8   --       Assessment and Plan: 39 yo married female with generalized and panic type anxiety. She has episodic panic attacks and excessive worrying (about multiple physical ailments/complaints -abdominal  pain,vertigo,headaches, heartburn). No depression, no SI. She is onZoloft(200 mg)as well. In the past on 1 mg tid of alprazolam -recentlychanged to 1 mg tid of lorazepam as it is less sedating yet helps more with occasional insomnia. She is also prescribed low dose of gabapentin for pain by her OB-GYN which should also help with anxiety and sleep. She is back at work at full time. She feels less anxious, keeps diet, lost 20 lbs. She would like to go back to alprazolam as it worked faster and wants to try a lower dose. Goal would be to gradually come off all her antianxiety meds.   Dx: GAD/Panic disorder  Plan:Continue Zoloft 200 mg daily and change lorazepam to alprazolam 0.5 mg bid prn anxiety/sleep.The plan was discussed with patient who had an opportunity to ask questions and these were all answered. I spend20 minuteson the phonewith the patient.Next visit in2 months.The plan was discussed with patient who had an opportunity to ask questions and these were all answered.    24, MD 06/29/2019, 8:41 AM

## 2019-07-03 ENCOUNTER — Telehealth: Payer: Self-pay | Admitting: Medical

## 2019-07-03 NOTE — Telephone Encounter (Signed)
Attempted to reach patient about needing to change her appointment due to changes made in the office. Left a voicemail so we could explain the changes.

## 2019-07-07 ENCOUNTER — Ambulatory Visit: Payer: No Typology Code available for payment source | Admitting: Family Medicine

## 2019-08-04 ENCOUNTER — Ambulatory Visit: Payer: No Typology Code available for payment source | Admitting: Medical

## 2019-08-07 ENCOUNTER — Encounter (HOSPITAL_COMMUNITY): Payer: Self-pay | Admitting: Emergency Medicine

## 2019-08-07 ENCOUNTER — Emergency Department (HOSPITAL_COMMUNITY)
Admission: EM | Admit: 2019-08-07 | Discharge: 2019-08-08 | Disposition: A | Discharge status: Home / Self Care | Payer: No Typology Code available for payment source | Attending: Emergency Medicine | Admitting: Emergency Medicine

## 2019-08-07 DIAGNOSIS — R202 Paresthesia of skin: Secondary | ICD-10-CM | POA: Diagnosis present

## 2019-08-07 DIAGNOSIS — I10 Essential (primary) hypertension: Secondary | ICD-10-CM | POA: Insufficient documentation

## 2019-08-07 DIAGNOSIS — Z79899 Other long term (current) drug therapy: Secondary | ICD-10-CM | POA: Diagnosis not present

## 2019-08-07 DIAGNOSIS — G5603 Carpal tunnel syndrome, bilateral upper limbs: Secondary | ICD-10-CM | POA: Diagnosis not present

## 2019-08-07 LAB — I-STAT CHEM 8, ED
BUN: 9 mg/dL (ref 6–20)
Calcium, Ion: 1.13 mmol/L — ABNORMAL LOW (ref 1.15–1.40)
Chloride: 107 mmol/L (ref 98–111)
Creatinine, Ser: 0.7 mg/dL (ref 0.44–1.00)
Glucose, Bld: 123 mg/dL — ABNORMAL HIGH (ref 70–99)
HCT: 42 % (ref 36.0–46.0)
Hemoglobin: 14.3 g/dL (ref 12.0–15.0)
Potassium: 3.9 mmol/L (ref 3.5–5.1)
Sodium: 142 mmol/L (ref 135–145)
TCO2: 22 mmol/L (ref 22–32)

## 2019-08-07 LAB — COMPREHENSIVE METABOLIC PANEL
ALT: 22 U/L (ref 0–44)
AST: 22 U/L (ref 15–41)
Albumin: 3.6 g/dL (ref 3.5–5.0)
Alkaline Phosphatase: 65 U/L (ref 38–126)
Anion gap: 9 (ref 5–15)
BUN: 9 mg/dL (ref 6–20)
CO2: 21 mmol/L — ABNORMAL LOW (ref 22–32)
Calcium: 9.3 mg/dL (ref 8.9–10.3)
Chloride: 109 mmol/L (ref 98–111)
Creatinine, Ser: 0.81 mg/dL (ref 0.44–1.00)
GFR calc Af Amer: >60 mL/min (ref >60)
GFR calc non Af Amer: >60 mL/min (ref >60)
Glucose, Bld: 122 mg/dL — ABNORMAL HIGH (ref 70–99)
Potassium: 4 mmol/L (ref 3.5–5.1)
Sodium: 139 mmol/L (ref 135–145)
Total Bilirubin: 0.5 mg/dL (ref 0.3–1.2)
Total Protein: 6.7 g/dL (ref 6.5–8.1)

## 2019-08-07 LAB — CBC
HCT: 43.8 % (ref 36.0–46.0)
Hemoglobin: 14.5 g/dL (ref 12.0–15.0)
MCH: 31.6 pg (ref 26.0–34.0)
MCHC: 33.1 g/dL (ref 30.0–36.0)
MCV: 95.4 fL (ref 80.0–100.0)
Platelets: 322 10*3/uL (ref 150–400)
RBC: 4.59 MIL/uL (ref 3.87–5.11)
RDW: 12 % (ref 11.5–15.5)
WBC: 6.4 10*3/uL (ref 4.0–10.5)
nRBC: 0 % (ref 0.0–0.2)

## 2019-08-07 LAB — APTT: aPTT: 28 seconds (ref 24–36)

## 2019-08-07 LAB — DIFFERENTIAL
Abs Immature Granulocytes: 0.02 10*3/uL (ref 0.00–0.07)
Basophils Absolute: 0 10*3/uL (ref 0.0–0.1)
Basophils Relative: 0 %
Eosinophils Absolute: 0.1 10*3/uL (ref 0.0–0.5)
Eosinophils Relative: 2 %
Immature Granulocytes: 0 %
Lymphocytes Relative: 20 %
Lymphs Abs: 1.3 10*3/uL (ref 0.7–4.0)
Monocytes Absolute: 0.4 10*3/uL (ref 0.1–1.0)
Monocytes Relative: 6 %
Neutro Abs: 4.6 10*3/uL (ref 1.7–7.7)
Neutrophils Relative %: 72 %

## 2019-08-07 LAB — PROTIME-INR
INR: 1 (ref 0.8–1.2)
Prothrombin Time: 12.4 seconds (ref 11.4–15.2)

## 2019-08-07 LAB — I-STAT BETA HCG BLOOD, ED (MC, WL, AP ONLY): I-stat hCG, quantitative: <5 m[IU]/mL (ref <5)

## 2019-08-07 MED ORDER — SODIUM CHLORIDE 0.9% FLUSH: 3.0000 mL | Freq: Once | INTRAVENOUS | Status: DC

## 2019-08-07 NOTE — ED Triage Notes (Signed)
Pt stated, Crying in triage. My arms and hands are numb and swelling for a month. Seen her Dr. And they referred me to an orthopaedic Dr., Josefa Half suppose to go on Friday.

## 2019-08-08 MED ORDER — PREDNISONE 10 MG PO TABS: ORAL_TABLET | ORAL | 0 refills | Status: AC

## 2019-08-08 MED ORDER — KETOROLAC TROMETHAMINE 30 MG/ML IJ SOLN
30.0000 mg | Freq: Once | INTRAMUSCULAR | Status: AC
  Administered 2019-08-08: 30 mg via INTRAMUSCULAR
  Filled 2019-08-08: qty 1

## 2019-08-08 MED ORDER — HYDROCODONE-ACETAMINOPHEN 5-325 MG PO TABS: 1.0000 | ORAL_TABLET | Freq: Four times a day (QID) | ORAL | 0 refills | Status: AC | PRN

## 2019-08-08 NOTE — ED Provider Notes (Signed)
[facility] EMERGENCY DEPARTMENT Provider Note   CSN: [id number] Arrival date & time: 08/07/19  1328     History Chief Complaint  Patient presents with  . Extremity Weakness  . Numbness    Jennifer Salazar is a 39 y.o. female.  Patient presents to the emergency department with a chief complaint of bilateral wrist and hand pain, numbness, and tingling.  She states that she has had the symptoms for the past month.  She states that she has been diagnosed with carpal tunnel remotely in the past, and also recently saw her doctor and was told that this was the same.  She is scheduled to see orthopedics on Friday of this week.  She states that she works at [employer], and has also worked in [employer] for many years.  She reports that she has been dropping items at the grocery store.  She has been wearing wrist splints, but states that after a long day of work her symptoms are severe.  She states that the pain radiates from her wrist into her hands and upper arms.  She reports that she has had some intermittent swelling in her fingers.  She denies fever.  Denies any traumatic injury.  The history is provided by the patient. No language interpreter was used.       Past Medical History:  Diagnosis Date  . Anxiety   . Hypertension   . Ovarian cyst     Patient Active Problem List   Diagnosis Date Noted  . Pelvic pain 04/10/2019  . Hemorrhagic cyst of ovary 09/07/2018  . Panic disorder 04/26/2018  . Generalized anxiety disorder 03/26/2018  . Bladder problem 11/06/2016  . Hypertensive disorder 11/06/2016  . Gastroesophageal reflux disease without esophagitis 11/06/2016    Past Surgical History:  Procedure Laterality Date  . WISDOM TOOTH EXTRACTION       OB History    Gravida  0   Para  0   Term  0   Preterm  0   AB  0   Living  0     SAB  0   TAB  0   Ectopic  0   Multiple  0   Live Births  0           Family History   Problem Relation Age of Onset  . Hypertension Mother   . COPD Father   . Crohn's disease Father   . Anxiety disorder Father   . Drug abuse Sister   . Anxiety disorder Brother     Social History   Tobacco Use  . Smoking status: Former Smoker    Packs/day: 0.25    Years: 10.00    Pack years: 2.50    Types: Cigarettes    Quit date: 11/08/2017    Years since quitting: 1.7  . Smokeless tobacco: Never Used  Vaping Use  . Vaping Use: Never used  Substance Use Topics  . Alcohol use: Not Currently  . Drug use: Never    Home Medications Prior to Admission medications   Medication Sig Start Date End Date Taking? Authorizing Provider  albuterol (VENTOLIN HFA) 108 (90 Base) MCG/ACT inhaler Inhale 1 puff into the lungs every 6 (six) hours as needed for wheezing or shortness of breath. Patient not taking: Reported on 04/10/2019 01/07/19   Delia Heady, PA-C  ALPRAZolam Duanne Moron) 0.5 MG tablet Take 1 tablet (0.5 mg total) by mouth 2 (two) times daily as needed for anxiety.  06/29/19 09/27/19  Pucilowski, Roosvelt Maser, MD  calcium carbonate (TUMS - DOSED IN MG ELEMENTAL CALCIUM) 500 MG chewable tablet Chew 1 tablet by mouth 2 (two) times daily as needed for indigestion or heartburn.    [provider]  fluconazole (DIFLUCAN) 150 MG tablet Take 1 tablet (150 mg total) by mouth daily. Repeat in 24 hours if needed 04/12/19   Reva Bores, MD  gabapentin (NEURONTIN) 300 MG capsule Take 1 capsule (300 mg total) by mouth 3 (three) times daily. May use 2 at bedtime 05/30/19   Reva Bores, MD  HYDROcodone-acetaminophen (NORCO/VICODIN) 5-325 MG tablet Take 1-2 tablets by mouth every 6 (six) hours as needed. 08/08/19   Roxy Horseman, PA-C  levonorgestrel (MIRENA) 20 MCG/24HR IUD 1 each by Intrauterine route once. Implanted winter 2017    [provider]  lisinopril (ZESTRIL) 20 MG tablet TAKE 1 TABLET BY MOUTH TWICE A DAY 03/06/19   Myles Lipps, MD  meclizine (ANTIVERT) 25 MG tablet  Take 1 tablet (25 mg total) by mouth 3 (three) times daily as needed for dizziness. Patient not taking: Reported on 04/10/2019 01/07/19   Dietrich Pates, PA-C  phenazopyridine (PYRIDIUM) 200 MG tablet Take 1 tablet (200 mg total) by mouth 3 (three) times daily as needed for pain. 02/01/19   Little, Ambrose Finland, MD  predniSONE (DELTASONE) 10 MG tablet Take 2 tablets daily for the first 7 days, then take 1 tablet daily for the next 7 days. 08/08/19   Roxy Horseman, PA-C  sertraline (ZOLOFT) 100 MG tablet Take 2 tablets (200 mg total) by mouth daily. 06/29/19   Pucilowski, Roosvelt Maser, MD  tamsulosin (FLOMAX) 0.4 MG CAPS capsule Take 1 capsule (0.4 mg total) by mouth daily. 02/01/19   Little, Ambrose Finland, MD  traMADol (ULTRAM) 50 MG tablet Take 1 tablet (50 mg total) by mouth every 6 (six) hours as needed. 06/15/19   Reva Bores, MD  pantoprazole (PROTONIX) 40 MG tablet Take 1 tablet (40 mg total) by mouth daily. Patient not taking: Reported on 09/05/2018 06/16/18 09/05/18  Tressia Danas, MD  potassium chloride SA (K-DUR) 20 MEQ tablet Take 1 tablet (20 mEq total) by mouth daily. Patient not taking: Reported on 09/05/2018 07/03/18 09/05/18  Loren Racer, MD    Allergies    Promethazine, Benadryl [diphenhydramine], and Reglan [metoclopramide]  Review of Systems   Review of Systems  All other systems reviewed and are negative.   Physical Exam Updated Vital Signs BP (!) 142/106 (BP Location: Left Arm)   Pulse 86   Temp 98.2 F (36.8 C) (Oral)   Resp 18   SpO2 100%   Physical Exam Vitals and nursing note reviewed.  Constitutional:      General: She is not in acute distress.    Appearance: She is well-developed.  HENT:     Head: Normocephalic and atraumatic.  Eyes:     Conjunctiva/sclera: Conjunctivae normal.  Cardiovascular:     Rate and Rhythm: Normal rate.     Heart sounds: No murmur heard.      Comments: Intact radial pulses bilaterally Pulmonary:     Effort: Pulmonary  effort is normal. No respiratory distress.  Abdominal:     General: There is no distension.  Musculoskeletal:     Cervical back: Neck supple.     Comments: Decreased ROM of bilateral wrists 2/2 pain Positive phalen test  Skin:    General: Skin is warm and dry.     Capillary Refill: Capillary refill  takes 2 to 3 seconds.  Neurological:     Mental Status: She is alert and oriented to person, place, and time.     Deep Tendon Reflexes:     Reflex Scores:      Tricep reflexes are 2+ on the right side and 2+ on the left side.      Bicep reflexes are 2+ on the right side and 2+ on the left side.      Brachioradialis reflexes are 2+ on the right side and 2+ on the left side. Psychiatric:        Mood and Affect: Mood normal.        Behavior: Behavior normal.     ED Results / Procedures / Treatments   Labs (all labs ordered are listed, but only abnormal results are displayed) Labs Reviewed  COMPREHENSIVE METABOLIC PANEL - Abnormal; Notable for the following components:      Result Value   CO2 21 (*)    Glucose, Bld 122 (*)    All other components within normal limits  I-STAT CHEM 8, ED - Abnormal; Notable for the following components:   Glucose, Bld 123 (*)    Calcium, Ion 1.13 (*)    All other components within normal limits  PROTIME-INR  APTT  CBC  DIFFERENTIAL  I-STAT BETA HCG BLOOD, ED ([facility], [facility], [facility] ONLY)  CBG MONITORING, ED    EKG EKG Interpretation  Date/Time:  Monday August 07 2019 14:17:13 EDT Ventricular Rate:  116 PR Interval:  156 QRS Duration: 70 QT Interval:  314 QTC Calculation: 436 R Axis:   13 Text Interpretation: Sinus tachycardia Otherwise normal ECG No significant change was found Confirmed by Glynn Octave [phone]) on 08/08/2019 3:48:34 AM   Radiology No results found.  Procedures Procedures (including critical care time)  Medications Ordered in ED Medications  sodium chloride flush (NS) 0.9 % injection 3 mL (has no administration in time range)   ketorolac (TORADOL) 30 MG/ML injection 30 mg (has no administration in time range)    ED Course  I have reviewed the triage vital signs and the nursing notes.  Pertinent labs & imaging results that were available during my care of the patient were reviewed by me and considered in my medical decision making (see chart for details).    MDM Rules/Calculators/A&P                          Patient is here with bilateral wrist pain.  She reports associated pain in her hands along with numbness and tingling.  She has been diagnosed remotely with carpal tunnel.  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  She states that she has been dropping multiple things for about the past month.  She was seen recently by her doctor and was diagnosed with carpal tunnel and was referred to orthopedic surgery.  She has not seen Ortho yet.  She has been using wrist splints with little relief.  Her exam findings are consistent with carpal tunnel.  I doubt cervical spine lesion or stroke.  Patient has normal upper extremity reflexes.  She also tells me that she had EMG studies by neurology in the past and was told that she had carpal tunnel by them as well.  I suspect that this is an acute exacerbation of a chronic condition, and I have encouraged patient follow-up with orthopedics.  I will give the patient a shot  of Toradol and send her home with some prednisone and 5 tablets of hydrocodone.  I have given her new wrist splints.  She is stable for discharge. Final Clinical Impression(s) / ED Diagnoses Final diagnoses:  Bilateral carpal tunnel syndrome    Rx / DC Orders ED Discharge Orders         Ordered    predniSONE (DELTASONE) 10 MG tablet     Discontinue  Reprint     08/08/19 0439    HYDROcodone-acetaminophen (NORCO/VICODIN) 5-325 MG tablet  Every 6 hours PRN     Discontinue  Reprint     08/08/19 0439           Roxy Horseman, PA-C 08/08/19 0450    Glynn Octave, MD 08/08/19 [phone]

## 2019-09-04 ENCOUNTER — Other Ambulatory Visit: Payer: Self-pay | Admitting: Family Medicine

## 2019-09-04 DIAGNOSIS — R102 Pelvic and perineal pain: Secondary | ICD-10-CM

## 2019-09-13 ENCOUNTER — Telehealth (HOSPITAL_COMMUNITY): Payer: Self-pay | Admitting: *Deleted

## 2019-09-13 ENCOUNTER — Other Ambulatory Visit (HOSPITAL_COMMUNITY): Payer: Self-pay | Admitting: Psychiatry

## 2019-09-13 MED ORDER — ALPRAZOLAM 0.5 MG PO TABS: 0.5000 mg | ORAL_TABLET | Freq: Three times a day (TID) | ORAL | 2 refills | Status: DC | PRN

## 2019-09-13 NOTE — Telephone Encounter (Signed)
Pt called requesting that the Xanax may be increased to tid as she has been exposed to COVID and is currently in quarantine. Pt says she is extremely anxious about all this. She also doesn't get paid while in quarantine. Please review and advise. Pt has an appointment on 09/27/19.

## 2019-09-13 NOTE — Telephone Encounter (Signed)
Done

## 2019-09-27 ENCOUNTER — Telehealth (INDEPENDENT_AMBULATORY_CARE_PROVIDER_SITE_OTHER): Payer: No Typology Code available for payment source | Admitting: Psychiatry

## 2019-09-27 ENCOUNTER — Other Ambulatory Visit: Payer: Self-pay

## 2019-09-27 DIAGNOSIS — F411 Generalized anxiety disorder: Secondary | ICD-10-CM | POA: Diagnosis not present

## 2019-09-27 DIAGNOSIS — F410 Panic disorder [episodic paroxysmal anxiety] without agoraphobia: Secondary | ICD-10-CM

## 2019-09-27 MED ORDER — SERTRALINE HCL 100 MG PO TABS: 200.0000 mg | ORAL_TABLET | Freq: Every day | ORAL | 0 refills | Status: DC

## 2019-09-27 MED ORDER — ALPRAZOLAM 0.5 MG PO TABS: 0.5000 mg | ORAL_TABLET | Freq: Four times a day (QID) | ORAL | 0 refills | Status: DC | PRN

## 2019-09-27 NOTE — Progress Notes (Signed)
[facility] MD/PA/NP OP Progress Note  09/27/2019 8:51 AM Jennifer Salazar  MRN:  [id number]  Interview was conducted by phone and I verified that I was speaking with the correct person using two identifiers. I discussed the limitations of evaluation and management by telemedicine and  the availability of in person appointments. Patient expressed understanding and agreed to proceed. Patient location - home; physician - home office. Chief Complaint: Panic attacks.  HPI: 39yo married female with generalized and panic type anxiety. She has episodic panic attacks and excessive worrying (about multiple physical ailments/complaints -abdominal pain,vertigo,headaches, heartburn). No depression, no SI. She is onZoloft(200 mg)as well. In the past on 1 mg tid of alprazolam, then 1 mg tid of lorazepam as it is less sedating yet helps more with occasional insomnia. She is also prescribed low dose of gabapentin for pain by her OB-GYN which should also help with anxiety and sleep. She is back at [employer] full time. She started to feel more anxious, frequent panic attacks as she perceives being closely monitored by her supervisors given past instances of being on short term disability and recently being out of work briefly. Goal would be to gradually come off all her antianxiety meds but at this time her anxiety is too high to start doing that..   Visit Diagnosis:    ICD-10-CM   1. Generalized anxiety disorder  F41.1   2. Panic disorder  F41.0     Past Psychiatric History: Please see intake H&P.  Past Medical History:  Past Medical History:  Diagnosis Date  . Anxiety   . Hypertension   . Ovarian cyst     Past Surgical History:  Procedure Laterality Date  . WISDOM TOOTH EXTRACTION      Family Psychiatric History: Reviewed.  Family History:  Family History  Problem Relation Age of Onset  . Hypertension Mother   . COPD Father   . Crohn's disease Father   . Anxiety disorder Father   .  Drug abuse Sister   . Anxiety disorder Brother     Social History:  Social History   Socioeconomic History  . Marital status: Married    Spouse name: Otilio Saber  . Number of children: 0  . Years of education: Not on file  . Highest education level: Some college, no degree  Occupational History  . Not on file  Tobacco Use  . Smoking status: Former Smoker    Packs/day: 0.25    Years: 10.00    Pack years: 2.50    Types: Cigarettes    Quit date: 11/08/2017    Years since quitting: 1.8  . Smokeless tobacco: Never Used  Vaping Use  . Vaping Use: Never used  Substance and Sexual Activity  . Alcohol use: Not Currently  . Drug use: Never  . Sexual activity: Yes    Birth control/protection: I.U.D.  Other Topics Concern  . Not on file  Social History Narrative  . Not on file   Social Determinants of Health   Financial Resource Strain:   . Difficulty of Paying Living Expenses:   Food Insecurity:   . Worried About Programme researcher, broadcasting/film/video in the Last Year:   . Barista in the Last Year:   Transportation Needs:   . Freight forwarder (Medical):   Marland Kitchen Lack of Transportation (Non-Medical):   Physical Activity:   . Days of Exercise per Week:   . Minutes of Exercise per Session:   Stress:   . Feeling  of Stress :   Social Connections:   . Frequency of Communication with Friends and Family:   . Frequency of Social Gatherings with Friends and Family:   . Attends Religious Services:   . Active Member of Clubs or Organizations:   . Attends Banker Meetings:   Marland Kitchen Marital Status:     Allergies:  Allergies  Allergen Reactions  . Promethazine Other (See Comments)  . Benadryl [Diphenhydramine] Other (See Comments)    Internal restlessness  . Reglan [Metoclopramide] Other (See Comments)    Hyperactivity, skin crawled    Metabolic Disorder Labs: No results found for: HGBA1C, MPG No results found for: PROLACTIN Lab Results  Component Value Date   CHOL 191  03/22/2018   TRIG 92 03/22/2018   HDL 46 03/22/2018   CHOLHDL 4.2 03/22/2018   LDLCALC 127 (H) 03/22/2018   Lab Results  Component Value Date   TSH 1.700 07/08/2018   TSH 2.730 03/22/2018    Therapeutic Level Labs: No results found for: LITHIUM No results found for: VALPROATE No components found for:  CBMZ  Current Medications: Current Outpatient Medications  Medication Sig Dispense Refill  . albuterol (VENTOLIN HFA) 108 (90 Base) MCG/ACT inhaler Inhale 1 puff into the lungs every 6 (six) hours as needed for wheezing or shortness of breath. (Patient not taking: Reported on 04/10/2019) 8 g 0  . [START ON 10/13/2019] ALPRAZolam (XANAX) 0.5 MG tablet Take 1 tablet (0.5 mg total) by mouth 4 (four) times daily as needed for anxiety. 120 tablet 0  . calcium carbonate (TUMS - DOSED IN MG ELEMENTAL CALCIUM) 500 MG chewable tablet Chew 1 tablet by mouth 2 (two) times daily as needed for indigestion or heartburn.    . fluconazole (DIFLUCAN) 150 MG tablet Take 1 tablet (150 mg total) by mouth daily. Repeat in 24 hours if needed 2 tablet 2  . gabapentin (NEURONTIN) 300 MG capsule TAKE 1 CAPSULE (300 MG TOTAL) BY MOUTH 3 (THREE) TIMES DAILY. MAY USE 2 AT BEDTIME 270 capsule 1  . HYDROcodone-acetaminophen (NORCO/VICODIN) 5-325 MG tablet Take 1-2 tablets by mouth every 6 (six) hours as needed. 5 tablet 0  . levonorgestrel (MIRENA) 20 MCG/24HR IUD 1 each by Intrauterine route once. Implanted winter 2017    . lisinopril (ZESTRIL) 20 MG tablet TAKE 1 TABLET BY MOUTH TWICE A DAY 180 tablet 1  . meclizine (ANTIVERT) 25 MG tablet Take 1 tablet (25 mg total) by mouth 3 (three) times daily as needed for dizziness. (Patient not taking: Reported on 04/10/2019) 30 tablet 0  . phenazopyridine (PYRIDIUM) 200 MG tablet Take 1 tablet (200 mg total) by mouth 3 (three) times daily as needed for pain. 10 tablet 0  . predniSONE (DELTASONE) 10 MG tablet Take 2 tablets daily for the first 7 days, then take 1 tablet daily for  the next 7 days. 21 tablet 0  . sertraline (ZOLOFT) 100 MG tablet Take 2 tablets (200 mg total) by mouth daily. 180 tablet 0  . tamsulosin (FLOMAX) 0.4 MG CAPS capsule Take 1 capsule (0.4 mg total) by mouth daily. 14 capsule 0  . traMADol (ULTRAM) 50 MG tablet Take 1 tablet (50 mg total) by mouth every 6 (six) hours as needed. 10 tablet 0   No current facility-administered medications for this visit.     Psychiatric Specialty Exam: Review of Systems  Psychiatric/Behavioral: Positive for sleep disturbance. The patient is nervous/anxious.   All other systems reviewed and are negative.   There were no vitals  taken for this visit.There is no height or weight on file to calculate BMI.  General Appearance: NA  Eye Contact:  NA  Speech:  Clear and Coherent and Normal Rate  Volume:  Normal  Mood:  Anxious  Affect:  NA  Thought Process:  Goal Directed  Orientation:  Full (Time, Place, and Person)  Thought Content: Rumination   Suicidal Thoughts:  No  Homicidal Thoughts:  No  Memory:  Immediate;   Good Recent;   Good Remote;   Good  Judgement:  Good  Insight:  Fair  Psychomotor Activity:  NA  Concentration:  Concentration: Fair  Recall:  Good  Fund of Knowledge: Good  Language: Good  Akathisia:  Negative  Handed:  Right  AIMS (if indicated): not done  Assets:  Communication Skills Desire for Improvement Financial Resources/Insurance Housing Intimacy Social Support Talents/Skills  ADL's:  Intact  Cognition: WNL  Sleep:  Fair   Screenings: GAD-7     Office Visit from 04/10/2019 in [facility] Office Visit from 07/08/2018 in Primary Care at [facility] Visit from 04/29/2018 in [facility] Care at [facility] Visit from 03/28/2018 in Primary Care at [facility] Visit from 03/22/2018 in Primary Care at [facility]  Total GAD-7 Score 7 11 7 11 11     PHQ2-9     Office Visit from 04/10/2019 in [facility] Office Visit from  07/28/2018 in Primary Care at [facility] Visit from 07/08/2018 in Primary Care at [facility] Visit from 04/29/2018 in Primary Care at [facility] Visit from 03/28/2018 in Primary Care at [facility] Total Score 2 0 3 1 0  PHQ-9 Total Score 5 6 12 8  --       Assessment and Plan: 39yo married female with generalized and panic type anxiety. She has episodic panic attacks and excessive worrying (about multiple physical ailments/complaints -abdominal pain,vertigo,headaches, heartburn). No depression, no SI. She is onZoloft(200 mg)as well. In the past on 1 mg tid of alprazolam, then 1 mg tid of lorazepam as it is less sedating yet helps more with occasional insomnia. She is also prescribed low dose of gabapentin for pain by her OB-GYN which should also help with anxiety and sleep. She is back at [employer] full time. She started to feel more anxious, frequent panic attacks as she perceives being closely monitored by her supervisors given past instances of being on short term disability and recently being out of work briefly. Goal would be to gradually come off all her antianxiety meds but at this time her anxiety is too high to start doing that.  Dx: GAD/Panic disorder  Plan:ContinueZoloft 200 mg daily and change lorazepam to alprazolam 0.5 mg qid prn anxiety/sleep.She will provide [employer] FMLA forms for intermittent out of work permission (doctor's visits, crisis) not more than 5 days a month. The plan was discussed with patient who had an opportunity to ask questions and these were all answered. I spend46minuteson the phonewith the patient.Next visit in6 weeks.The plan was discussed with patient who had an opportunity to ask questions and these were all answered.    [place], MD 09/27/2019, 8:51 AM

## 2019-10-09 ENCOUNTER — Other Ambulatory Visit (HOSPITAL_COMMUNITY): Payer: Self-pay | Admitting: *Deleted

## 2019-10-09 ENCOUNTER — Telehealth (HOSPITAL_COMMUNITY): Payer: Self-pay | Admitting: *Deleted

## 2019-10-09 NOTE — Telephone Encounter (Signed)
I increased Xanax to qid so Rx for tid should be cancelled (including refills).

## 2019-10-09 NOTE — Telephone Encounter (Signed)
Message received for pt pharmacy [facility] [address]. Pharmacist expressing concerns due to pt attempting to fill 2 scripts for her Xanax. One is for the new increase to qid prn, and the is for the tid dose written on 7/28 with 2 refills, which she tried to refill last week but it is too early. Please review. I will call [facility] and confirm new dosing schedule.

## 2019-10-18 ENCOUNTER — Telehealth (INDEPENDENT_AMBULATORY_CARE_PROVIDER_SITE_OTHER): Payer: No Typology Code available for payment source | Admitting: Psychiatry

## 2019-10-18 ENCOUNTER — Other Ambulatory Visit: Payer: Self-pay

## 2019-10-18 DIAGNOSIS — F410 Panic disorder [episodic paroxysmal anxiety] without agoraphobia: Secondary | ICD-10-CM

## 2019-10-18 DIAGNOSIS — F411 Generalized anxiety disorder: Secondary | ICD-10-CM

## 2019-10-18 MED ORDER — ONDANSETRON 4 MG PO TBDP: 4.0000 mg | ORAL_TABLET | Freq: Three times a day (TID) | ORAL | 0 refills | Status: DC | PRN

## 2019-10-18 NOTE — Progress Notes (Signed)
BH MD/PA/NP OP Progress Note  10/18/2019 8:12 AM Jennifer LoopSarah Treichler  MRN:  [id number] Interview was conducted by phone and I verified that I was speaking with the correct person using two identifiers. I discussed the limitations of evaluation and management by telemedicine and  the availability of in person appointments. Patient expressed understanding and agreed to proceed. Patient location - home; physician - home office.  Chief Complaint: Anxiety attacks.  HPI: 39yo married female with generalized and panic type anxiety. She has episodic panic attacks and excessive worrying (about multiple physical ailments/complaints -abdominal pain,vertigo,headaches, heartburn). No depression, no SI. She is onZoloft(200 mg)as well. In the past on 1 mg tid of alprazolam, then 1 mg tid of lorazepam as it is less sedating yet helps more with occasional insomnia. She is also prescribed low dose of gabapentin for pain by her OB-GYN which should also help with anxiety and sleep. She is back at [employer] time. She started to feel more anxious, frequent panic attacks as she perceives being closely monitored by her supervisors given past instances of being on short term disability and recently being out of work briefly. Goal would be to gradually come off all her antianxiety meds but at this time her anxiety is too high to start doing that. Her mother will be moving in soon and this is another reason for her being anxious. This week (Monday, and Tuesday) she was having high anxiety, felt nauseated, vomited) and did not go to work.She feels better today and when she called her PCP they refused to see her as she is not having any other symptoms.   Visit Diagnosis:    ICD-10-CM   1. Panic disorder  F41.0   2. Generalized anxiety disorder  F41.1     Past Psychiatric History: Please see intake H&P.  Past Medical History:  Past Medical History:  Diagnosis Date  . Anxiety   . Hypertension   .  Ovarian cyst     Past Surgical History:  Procedure Laterality Date  . WISDOM TOOTH EXTRACTION      Family Psychiatric History: Reviewed.  Family History:  Family History  Problem Relation Age of Onset  . Hypertension Mother   . COPD Father   . Crohn's disease Father   . Anxiety disorder Father   . Drug abuse Sister   . Anxiety disorder Brother     Social History:  Social History   Socioeconomic History  . Marital status: Married    Spouse name: Otilio Sabertravis smith  . Number of children: 0  . Years of education: Not on file  . Highest education level: Some college, no degree  Occupational History  . Not on file  Tobacco Use  . Smoking status: Former Smoker    Packs/day: 0.25    Years: 10.00    Pack years: 2.50    Types: Cigarettes    Quit date: 11/08/2017    Years since quitting: 1.9  . Smokeless tobacco: Never Used  Vaping Use  . Vaping Use: Never used  Substance and Sexual Activity  . Alcohol use: Not Currently  . Drug use: Never  . Sexual activity: Yes    Birth control/protection: I.U.D.  Other Topics Concern  . Not on file  Social History Narrative  . Not on file   Social Determinants of Health   Financial Resource Strain:   . Difficulty of Paying Living Expenses: Not on file  Food Insecurity:   . Worried About Programme researcher, broadcasting/film/videounning Out of Food in the  Last Year: Not on file  . Ran Out of Food in the Last Year: Not on file  Transportation Needs:   . Lack of Transportation (Medical): Not on file  . Lack of Transportation (Non-Medical): Not on file  Physical Activity:   . Days of Exercise per Week: Not on file  . Minutes of Exercise per Session: Not on file  Stress:   . Feeling of Stress : Not on file  Social Connections:   . Frequency of Communication with Friends and Family: Not on file  . Frequency of Social Gatherings with Friends and Family: Not on file  . Attends Religious Services: Not on file  . Active Member of Clubs or Organizations: Not on file  . Attends  Banker Meetings: Not on file  . Marital Status: Not on file    Allergies:  Allergies  Allergen Reactions  . Promethazine Other (See Comments)  . Benadryl [Diphenhydramine] Other (See Comments)    Internal restlessness  . Reglan [Metoclopramide] Other (See Comments)    Hyperactivity, skin crawled    Metabolic Disorder Labs: No results found for: HGBA1C, MPG No results found for: PROLACTIN Lab Results  Component Value Date   CHOL 191 03/22/2018   TRIG 92 03/22/2018   HDL 46 03/22/2018   CHOLHDL 4.2 03/22/2018   LDLCALC 127 (H) 03/22/2018   Lab Results  Component Value Date   TSH 1.700 07/08/2018   TSH 2.730 03/22/2018    Therapeutic Level Labs: No results found for: LITHIUM No results found for: VALPROATE No components found for:  CBMZ  Current Medications: Current Outpatient Medications  Medication Sig Dispense Refill  . albuterol (VENTOLIN HFA) 108 (90 Base) MCG/ACT inhaler Inhale 1 puff into the lungs every 6 (six) hours as needed for wheezing or shortness of breath. (Patient not taking: Reported on 04/10/2019) 8 g 0  . ALPRAZolam (XANAX) 0.5 MG tablet Take 1 tablet (0.5 mg total) by mouth 4 (four) times daily as needed for anxiety. 120 tablet 0  . calcium carbonate (TUMS - DOSED IN MG ELEMENTAL CALCIUM) 500 MG chewable tablet Chew 1 tablet by mouth 2 (two) times daily as needed for indigestion or heartburn.    . fluconazole (DIFLUCAN) 150 MG tablet Take 1 tablet (150 mg total) by mouth daily. Repeat in 24 hours if needed 2 tablet 2  . gabapentin (NEURONTIN) 300 MG capsule TAKE 1 CAPSULE (300 MG TOTAL) BY MOUTH 3 (THREE) TIMES DAILY. MAY USE 2 AT BEDTIME 270 capsule 1  . HYDROcodone-acetaminophen (NORCO/VICODIN) 5-325 MG tablet Take 1-2 tablets by mouth every 6 (six) hours as needed. 5 tablet 0  . levonorgestrel (MIRENA) 20 MCG/24HR IUD 1 each by Intrauterine route once. Implanted winter 2017    . lisinopril (ZESTRIL) 20 MG tablet TAKE 1 TABLET BY MOUTH  TWICE A DAY 180 tablet 1  . meclizine (ANTIVERT) 25 MG tablet Take 1 tablet (25 mg total) by mouth 3 (three) times daily as needed for dizziness. (Patient not taking: Reported on 04/10/2019) 30 tablet 0  . ondansetron (ZOFRAN-ODT) 4 MG disintegrating tablet Take 1 tablet (4 mg total) by mouth every 8 (eight) hours as needed for nausea or vomiting. 20 tablet 0  . phenazopyridine (PYRIDIUM) 200 MG tablet Take 1 tablet (200 mg total) by mouth 3 (three) times daily as needed for pain. 10 tablet 0  . predniSONE (DELTASONE) 10 MG tablet Take 2 tablets daily for the first 7 days, then take 1 tablet daily for the next 7 days.  21 tablet 0  . sertraline (ZOLOFT) 100 MG tablet Take 2 tablets (200 mg total) by mouth daily. 180 tablet 0  . tamsulosin (FLOMAX) 0.4 MG CAPS capsule Take 1 capsule (0.4 mg total) by mouth daily. 14 capsule 0   No current facility-administered medications for this visit.     Psychiatric Specialty Exam: Review of Systems  Gastrointestinal: Positive for nausea.  Psychiatric/Behavioral: The patient is nervous/anxious.   All other systems reviewed and are negative.   There were no vitals taken for this visit.There is no height or weight on file to calculate BMI.  General Appearance: NA  Eye Contact:  NA  Speech:  Clear and Coherent and Normal Rate  Volume:  Normal  Mood:  Anxious  Affect:  NA  Thought Process:  Goal Directed and Linear  Orientation:  Full (Time, Place, and Person)  Thought Content: Logical   Suicidal Thoughts:  No  Homicidal Thoughts:  No  Memory:  Immediate;   Good Recent;   Good Remote;   Good  Judgement:  Good  Insight:  Fair  Psychomotor Activity:  NA  Concentration:  Concentration: Good  Recall:  Good  Fund of Knowledge: Good  Language: Good  Akathisia:  Negative  Handed:  Right  AIMS (if indicated): not done  Assets:  Communication Skills Desire for Improvement Financial Resources/Insurance Housing Intimacy Social  Support Talents/Skills  ADL's:  Intact  Cognition: WNL  Sleep:  Good   Screenings: GAD-7     Office Visit from 04/10/2019 in Center for [facility] Office Visit from 07/08/2018 in Primary Care at [facility] Visit from 04/29/2018 in Primary Care at [facility] Visit from 03/28/2018 in Primary Care at [facility] Visit from 03/22/2018 in Primary Care at [facility]  Total GAD-7 Score 7 11 7 11 11     PHQ2-9     Office Visit from 04/10/2019 in Center for [facility] Office Visit from 07/28/2018 in Primary Care at [facility] Visit from 07/08/2018 in Primary Care at [facility] Visit from 04/29/2018 in Primary Care at [facility] Visit from 03/28/2018 in Primary Care at [facility] Total Score 2 0 3 1 0  PHQ-9 Total Score 5 6 12 8  --       Assessment and Plan: 39yo married female with generalized and panic type anxiety. She has episodic panic attacks and excessive worrying (about multiple physical ailments/complaints -abdominal pain,vertigo,headaches, heartburn). No depression, no SI. She is onZoloft(200 mg)as well. In the past on 1 mg tid of alprazolam, then 1 mg tid of lorazepam as it is less sedating yet helps more with occasional insomnia. She is also prescribed low dose of gabapentin for pain by her OB-GYN which should also help with anxiety and sleep. She is back at [employer] time. She started to feel more anxious, frequent panic attacks as she perceives being closely monitored by her supervisors given past instances of being on short term disability and recently being out of work briefly. Goal would be to gradually come off all her antianxiety meds but at this time her anxiety is too high to start doing that. Her mother will be moving in soon and this is another reason for her being anxious. This week (Monday, and Tuesday) she was having high anxiety, felt nauseated, vomited) and did not go to work.She feels better today and when she  called her PCP they refused to see her as she is not having any other symptoms.   Dx: GAD/Panic  disorder  Plan:ContinueZoloft 200 mg dailyand alprazolam 0.5 mg qid prn anxiety/sleep.She will provide [place] FMLA forms for intermittent out of work permission (doctor's visits, crisis) not more than 5 days a month. At this time we will provide her with note excusing her absence from work from 8/30 through 10/18/19. I will also send a short Rx for Zofran 4 mg ODT prn nausea. The plan was discussed with patient who had an opportunity to ask questions and these were all answered. I spend21minuteson the phonewith the patient.Next visit in4 weeks.The plan was discussed with patient who had an opportunity to ask questions and these were all answered.    Magdalene Patricia, MD 10/18/2019, 8:12 AM

## 2019-10-19 ENCOUNTER — Encounter (HOSPITAL_COMMUNITY): Payer: Self-pay

## 2019-11-01 ENCOUNTER — Other Ambulatory Visit (HOSPITAL_COMMUNITY): Payer: Self-pay | Admitting: Psychiatry

## 2019-11-01 ENCOUNTER — Telehealth (HOSPITAL_COMMUNITY): Payer: Self-pay | Admitting: *Deleted

## 2019-11-01 MED ORDER — ALPRAZOLAM 0.5 MG PO TABS
0.5000 mg | ORAL_TABLET | Freq: Four times a day (QID) | ORAL | 0 refills | Status: DC | PRN
Start: 2019-11-01 — End: 2019-11-17

## 2019-11-01 NOTE — Telephone Encounter (Signed)
I reordered it although it seems that her refill due date is 73 of this month so insurance may not cover it earlier.

## 2019-11-01 NOTE — Telephone Encounter (Signed)
Patient called and requested refill of Alprazolam.  Her next appt is 9/28. Please review and advise.

## 2019-11-01 NOTE — Telephone Encounter (Signed)
Ok thanks 

## 2019-11-08 ENCOUNTER — Telehealth (HOSPITAL_COMMUNITY): Payer: Self-pay | Admitting: *Deleted

## 2019-11-08 NOTE — Telephone Encounter (Signed)
Pt called requesting a refill of the Zofran as she is going on a trip which involves air travel. Pt had previous Rx ordered on 10/18/19 #20. Writer called to ask if pt had any remaining but have been unable to get through byt/c. Please review and advise.

## 2019-11-09 ENCOUNTER — Other Ambulatory Visit (HOSPITAL_COMMUNITY): Payer: Self-pay | Admitting: Psychiatry

## 2019-11-09 MED ORDER — ONDANSETRON 4 MG PO TBDP: 4.0000 mg | ORAL_TABLET | Freq: Three times a day (TID) | ORAL | 0 refills | Status: DC | PRN

## 2019-11-09 NOTE — Telephone Encounter (Signed)
I reordered the same amount.

## 2019-11-14 ENCOUNTER — Other Ambulatory Visit: Payer: Self-pay

## 2019-11-14 ENCOUNTER — Telehealth (HOSPITAL_COMMUNITY): Payer: No Typology Code available for payment source | Admitting: Psychiatry

## 2019-11-17 ENCOUNTER — Ambulatory Visit (INDEPENDENT_AMBULATORY_CARE_PROVIDER_SITE_OTHER): Payer: No Typology Code available for payment source | Admitting: Psychiatry

## 2019-11-17 ENCOUNTER — Other Ambulatory Visit: Payer: Self-pay

## 2019-11-17 DIAGNOSIS — F411 Generalized anxiety disorder: Secondary | ICD-10-CM

## 2019-11-17 DIAGNOSIS — F410 Panic disorder [episodic paroxysmal anxiety] without agoraphobia: Secondary | ICD-10-CM | POA: Diagnosis not present

## 2019-11-17 MED ORDER — ALPRAZOLAM 0.5 MG PO TABS
0.5000 mg | ORAL_TABLET | Freq: Four times a day (QID) | ORAL | 0 refills | Status: DC | PRN
Start: 2019-12-01 — End: 2019-11-23

## 2019-11-17 MED ORDER — SERTRALINE HCL 100 MG PO TABS
200.0000 mg | ORAL_TABLET | Freq: Every day | ORAL | 0 refills | Status: DC
Start: 2019-12-01 — End: 2020-02-22

## 2019-11-17 MED ORDER — ONDANSETRON 4 MG PO TBDP: 4.0000 mg | ORAL_TABLET | Freq: Three times a day (TID) | ORAL | 0 refills | Status: AC | PRN

## 2019-11-17 NOTE — Progress Notes (Signed)
BH MD/PA/NP OP Progress Note  11/17/2019 9:11 AM Jennifer Salazar  MRN:  [id number] Interview was conducted by phone and I verified that I was speaking with the correct person using two identifiers. I discussed the limitations of evaluation and management by telemedicine and  the availability of in person appointments. Patient expressed understanding and agreed to proceed. Patient location - home; physician - home office.  Chief Complaint: Anxious, intermittent nausea.  HPI: 39yo married female with generalized and panic type anxiety. She has episodic panic attacks and excessive worrying (about multiple physical ailments/complaints -abdominal pain,vertigo,headaches, heartburn). No depression, no SI. She is onZoloft(200 mg)as well. In the past on 1 mg tid of alprazolam, then1 mg tid of lorazepam and now back on alprazolam which works faster for panic attacks. She is also prescribed low dose of gabapentin for pain by her OB-GYN which should also help with anxiety and sleep. She worksat [employer] time. Shestarted to feel more anxious, frequent panic attacks as she perceives being closely monitored by her supervisors given past instances of being on short term disability and recently being out of work briefly.Goal would be to gradually come off all her antianxiety medsbut at this time her anxiety is too high to start doing that.  She just returned from vacation sick - nauseated, abdominal cramps, fever, could not go to work. She will get COVID tested tomorrow.  . Visit Diagnosis:    ICD-10-CM   1. Generalized anxiety disorder  F41.1   2. Panic disorder  F41.0     Past Psychiatric History: Please see intake H&P.  Past Medical History:  Past Medical History:  Diagnosis Date  . Anxiety   . Hypertension   . Ovarian cyst     Past Surgical History:  Procedure Laterality Date  . WISDOM TOOTH EXTRACTION      Family Psychiatric History: Reviewed.  Family History:  Family  History  Problem Relation Age of Onset  . Hypertension Mother   . COPD Father   . Crohn's disease Father   . Anxiety disorder Father   . Drug abuse Sister   . Anxiety disorder Brother     Social History:  Social History   Socioeconomic History  . Marital status: Married    Spouse name: Otilio Saber  . Number of children: 0  . Years of education: Not on file  . Highest education level: Some college, no degree  Occupational History  . Not on file  Tobacco Use  . Smoking status: Former Smoker    Packs/day: 0.25    Years: 10.00    Pack years: 2.50    Types: Cigarettes    Quit date: 11/08/2017    Years since quitting: 2.0  . Smokeless tobacco: Never Used  Vaping Use  . Vaping Use: Never used  Substance and Sexual Activity  . Alcohol use: Not Currently  . Drug use: Never  . Sexual activity: Yes    Birth control/protection: I.U.D.  Other Topics Concern  . Not on file  Social History Narrative  . Not on file   Social Determinants of Health   Financial Resource Strain:   . Difficulty of Paying Living Expenses: Not on file  Food Insecurity:   . Worried About Programme researcher, broadcasting/film/video in the Last Year: Not on file  . Ran Out of Food in the Last Year: Not on file  Transportation Needs:   . Lack of Transportation (Medical): Not on file  . Lack of Transportation (Non-Medical): Not on file  Physical Activity:   . Days of Exercise per Week: Not on file  . Minutes of Exercise per Session: Not on file  Stress:   . Feeling of Stress : Not on file  Social Connections:   . Frequency of Communication with Friends and Family: Not on file  . Frequency of Social Gatherings with Friends and Family: Not on file  . Attends Religious Services: Not on file  . Active Member of Clubs or Organizations: Not on file  . Attends Banker Meetings: Not on file  . Marital Status: Not on file    Allergies:  Allergies  Allergen Reactions  . Promethazine Other (See Comments)  .  Benadryl [Diphenhydramine] Other (See Comments)    Internal restlessness  . Reglan [Metoclopramide] Other (See Comments)    Hyperactivity, skin crawled    Metabolic Disorder Labs: No results found for: HGBA1C, MPG No results found for: PROLACTIN Lab Results  Component Value Date   CHOL 191 03/22/2018   TRIG 92 03/22/2018   HDL 46 03/22/2018   CHOLHDL 4.2 03/22/2018   LDLCALC 127 (H) 03/22/2018   Lab Results  Component Value Date   TSH 1.700 07/08/2018   TSH 2.730 03/22/2018    Therapeutic Level Labs: No results found for: LITHIUM No results found for: VALPROATE No components found for:  CBMZ  Current Medications: Current Outpatient Medications  Medication Sig Dispense Refill  . albuterol (VENTOLIN HFA) 108 (90 Base) MCG/ACT inhaler Inhale 1 puff into the lungs every 6 (six) hours as needed for wheezing or shortness of breath. (Patient not taking: Reported on 04/10/2019) 8 g 0  . [START ON 12/01/2019] ALPRAZolam (XANAX) 0.5 MG tablet Take 1 tablet (0.5 mg total) by mouth 4 (four) times daily as needed for anxiety. 120 tablet 0  . calcium carbonate (TUMS - DOSED IN MG ELEMENTAL CALCIUM) 500 MG chewable tablet Chew 1 tablet by mouth 2 (two) times daily as needed for indigestion or heartburn.    . fluconazole (DIFLUCAN) 150 MG tablet Take 1 tablet (150 mg total) by mouth daily. Repeat in 24 hours if needed 2 tablet 2  . gabapentin (NEURONTIN) 300 MG capsule TAKE 1 CAPSULE (300 MG TOTAL) BY MOUTH 3 (THREE) TIMES DAILY. MAY USE 2 AT BEDTIME 270 capsule 1  . HYDROcodone-acetaminophen (NORCO/VICODIN) 5-325 MG tablet Take 1-2 tablets by mouth every 6 (six) hours as needed. 5 tablet 0  . levonorgestrel (MIRENA) 20 MCG/24HR IUD 1 each by Intrauterine route once. Implanted winter 2017    . lisinopril (ZESTRIL) 20 MG tablet TAKE 1 TABLET BY MOUTH TWICE A DAY 180 tablet 1  . meclizine (ANTIVERT) 25 MG tablet Take 1 tablet (25 mg total) by mouth 3 (three) times daily as needed for dizziness.  (Patient not taking: Reported on 04/10/2019) 30 tablet 0  . ondansetron (ZOFRAN-ODT) 4 MG disintegrating tablet Take 1 tablet (4 mg total) by mouth every 8 (eight) hours as needed for nausea or vomiting. 20 tablet 0  . phenazopyridine (PYRIDIUM) 200 MG tablet Take 1 tablet (200 mg total) by mouth 3 (three) times daily as needed for pain. 10 tablet 0  . predniSONE (DELTASONE) 10 MG tablet Take 2 tablets daily for the first 7 days, then take 1 tablet daily for the next 7 days. 21 tablet 0  . [START ON 12/01/2019] sertraline (ZOLOFT) 100 MG tablet Take 2 tablets (200 mg total) by mouth daily. 180 tablet 0  . tamsulosin (FLOMAX) 0.4 MG CAPS capsule Take 1 capsule (0.4 mg  total) by mouth daily. 14 capsule 0   No current facility-administered medications for this visit.     Psychiatric Specialty Exam: Review of Systems  Constitutional: Positive for fever.  Gastrointestinal: Positive for abdominal pain and nausea.  Psychiatric/Behavioral: The patient is nervous/anxious.   All other systems reviewed and are negative.   There were no vitals taken for this visit.There is no height or weight on file to calculate BMI.  General Appearance: NA  Eye Contact:  NA  Speech:  Clear and Coherent and Normal Rate  Volume:  Normal  Mood:  Anxious  Affect:  NA  Thought Process:  Goal Directed  Orientation:  Full (Time, Place, and Person)  Thought Content: Rumination   Suicidal Thoughts:  No  Homicidal Thoughts:  No  Memory:  Immediate;   Good Recent;   Good Remote;   Good  Judgement:  Good  Insight:  Fair  Psychomotor Activity:  NA  Concentration:  Concentration: Good  Recall:  Good  Fund of Knowledge: Good  Language: Good  Akathisia:  Negative  Handed:  Right  AIMS (if indicated): not done  Assets:  Communication Skills  ADL's:  Intact  Cognition: WNL  Sleep:  Fair   Screenings: GAD-7     Office Visit from 04/10/2019 in Center for [facility] Office Visit from 07/08/2018 in  Primary Care at [facility] Visit from 04/29/2018 in Primary Care at [facility] Visit from 03/28/2018 in Primary Care at [facility] Visit from 03/22/2018 in Primary Care at [facility]  Total GAD-7 Score 7 11 7 11 11     PHQ2-9     Office Visit from 04/10/2019 in Center for [facility] Office Visit from 07/28/2018 in Primary Care at [facility] Visit from 07/08/2018 in Primary Care at [facility] Visit from 04/29/2018 in Primary Care at [facility] Visit from 03/28/2018 in Primary Care at [facility] Total Score 2 0 3 1 0  PHQ-9 Total Score 5 6 12 8  --       Assessment and Plan: 39yo married female with generalized and panic type anxiety. She has episodic panic attacks and excessive worrying (about multiple physical ailments/complaints -abdominal pain,vertigo,headaches, heartburn). No depression, no SI. She is onZoloft(200 mg)as well. In the past on 1 mg tid of alprazolam, then1 mg tid of lorazepam and now back on alprazolam which works faster for panic attacks. She is also prescribed low dose of gabapentin for pain by her OB-GYN which should also help with anxiety and sleep. She worksat [employer] time. Shestarted to feel more anxious, frequent panic attacks as she perceives being closely monitored by her supervisors given past instances of being on short term disability and recently being out of work briefly.Goal would be to gradually come off all her antianxiety medsbut at this time her anxiety is too high to start doing that.  She just returned from vacation sick - nauseated, abdominal cramps, fever, could not go to work. She will get COVID tested tomorrow.   Dx: GAD/Panic disorder  Plan:ContinueZoloft 200 mg dailyand alprazolam 0.5 mgqid prn anxiety/sleep.I will also send a short Rx for Zofran 4 mg ODT prn nausea. The plan was discussed with patient who had an opportunity to ask questions and these were all answered. I spend11minuteson the phonewith  the patient.Next visit in2 months.The plan was discussed with patient who had an opportunity to ask questions and these were all answered.   , MD 11/17/2019, 9:11 AM

## 2019-11-23 ENCOUNTER — Telehealth (HOSPITAL_COMMUNITY): Payer: Self-pay | Admitting: *Deleted

## 2019-11-23 ENCOUNTER — Other Ambulatory Visit (HOSPITAL_COMMUNITY): Payer: Self-pay | Admitting: Psychiatry

## 2019-11-23 MED ORDER — LORAZEPAM 1 MG PO TABS: 1.0000 mg | ORAL_TABLET | Freq: Three times a day (TID) | ORAL | 1 refills | Status: DC | PRN

## 2019-11-23 NOTE — Telephone Encounter (Signed)
Pt called asking if she can go back to taking the Ativan stating that she feels it worked better than the Xanax. Pt has an appointment on 01/19/20. Please review and advise.

## 2019-11-23 NOTE — Telephone Encounter (Signed)
I reordered Ativan as per last prescription from April this year 1 mg tid prn). I cannot remember why but it was her who asked me to change it to alprazolam?

## 2019-12-06 ENCOUNTER — Telehealth (HOSPITAL_COMMUNITY): Payer: Self-pay | Admitting: *Deleted

## 2019-12-06 NOTE — Telephone Encounter (Signed)
Pt called asking if she may get a few Xanax due to the fact that she lost her job, she quit, and feels she needs something for the panic related to this. Pt says she has plenty of Ativan left. Please review and advise.

## 2019-12-06 NOTE — Telephone Encounter (Signed)
I think it is not logical thing to do - she should use lorazepam for anxiety and no mix it with Xanax (same mechanism of action). She may take it more often and should she run ou sooner than expected let us know so I can order earlier refill.

## 2019-12-14 ENCOUNTER — Telehealth (INDEPENDENT_AMBULATORY_CARE_PROVIDER_SITE_OTHER): Payer: No Typology Code available for payment source | Admitting: Psychiatry

## 2019-12-14 ENCOUNTER — Other Ambulatory Visit: Payer: Self-pay

## 2019-12-14 DIAGNOSIS — F410 Panic disorder [episodic paroxysmal anxiety] without agoraphobia: Secondary | ICD-10-CM

## 2019-12-14 DIAGNOSIS — F411 Generalized anxiety disorder: Secondary | ICD-10-CM

## 2019-12-14 MED ORDER — LORAZEPAM 1 MG PO TABS
1.0000 mg | ORAL_TABLET | Freq: Four times a day (QID) | ORAL | 1 refills | Status: AC | PRN
Start: 2019-12-14 — End: 2020-02-12

## 2019-12-14 NOTE — Progress Notes (Signed)
BH MD/PA/NP OP Progress Note  12/14/2019 8:15 AM Jennifer Salazar  MRN:  [id number] Interview was conducted by phone and I verified that I was speaking with the correct person using two identifiers. I discussed the limitations of evaluation and management by telemedicine and  the availability of in person appointments. Patient expressed understanding and agreed to proceed. Patient location - home; physician - home office.  Chief Complaint: High anxiety.  HPI: 39yo married female with generalized and panic type anxiety. She has episodic panic attacks and excessive worrying (about multiple physical ailments/complaints -abdominal pain,vertigo,headaches, heartburn). No depression, no SI. She is onZoloft(200 mg)as well. In the past on 1 mg tid of alprazolam, then1 mg tid of lorazepam for panic attacks. She had been working at [employer] time. 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 She is still anxious but at the same time relieved that she is done with previous job. Goal would be to gradually come off all her antianxiety medsbut at this time her anxiety is too high to start doing that.    Visit Diagnosis:    ICD-10-CM   1. Panic disorder  F41.0   2. Generalized anxiety disorder  F41.1     Past Psychiatric History: Please see intake H&P.  Past Medical History:  Past Medical History:  Diagnosis Date  . Anxiety   . Hypertension   . Ovarian cyst     Past Surgical History:  Procedure Laterality Date  . WISDOM TOOTH EXTRACTION      Family Psychiatric History: Reviewed.  Family History:  Family History  Problem Relation Age of Onset  . Hypertension Mother   . COPD  Father   . Crohn's disease Father   . Anxiety disorder Father   . Drug abuse Sister   . Anxiety disorder Brother     Social History:  Social History   Socioeconomic History  . Marital status: Married    Spouse name: Otilio Saber  . Number of children: 0  . Years of education: Not on file  . Highest education level: Some college, no degree  Occupational History  . Not on file  Tobacco Use  . Smoking status: Former Smoker    Packs/day: 0.25    Years: 10.00    Pack years: 2.50    Types: Cigarettes    Quit date: 11/08/2017    Years since quitting: 2.0  . Smokeless tobacco: Never Used  Vaping Use  . Vaping Use: Never used  Substance and Sexual Activity  . Alcohol use: Not Currently  . Drug use: Never  . Sexual activity: Yes    Birth control/protection: I.U.D.  Other Topics Concern  . Not on file  Social History Narrative  . Not on file   Social Determinants of Health   Financial Resource Strain:   . Difficulty of Paying Living Expenses: Not on file  Food Insecurity:   . Worried About Programme researcher, broadcasting/film/video in the Last Year: Not on file  . Ran Out of Food in the Last Year: Not on file  Transportation Needs:   . Lack of Transportation (Medical): Not on file  . Lack of Transportation (Non-Medical): Not on file  Physical Activity:   . Days of Exercise  per Week: Not on file  . Minutes of Exercise per Session: Not on file  Stress:   . Feeling of Stress : Not on file  Social Connections:   . Frequency of Communication with Friends and Family: Not on file  . Frequency of Social Gatherings with Friends and Family: Not on file  . Attends Religious Services: Not on file  . Active Member of Clubs or Organizations: Not on file  . Attends Banker Meetings: Not on file  . Marital Status: Not on file    Allergies:  Allergies  Allergen Reactions  . Promethazine Other (See Comments)  . Benadryl [Diphenhydramine] Other (See Comments)    Internal restlessness  .  Reglan [Metoclopramide] Other (See Comments)    Hyperactivity, skin crawled    Metabolic Disorder Labs: No results found for: HGBA1C, MPG No results found for: PROLACTIN Lab Results  Component Value Date   CHOL 191 03/22/2018   TRIG 92 03/22/2018   HDL 46 03/22/2018   CHOLHDL 4.2 03/22/2018   LDLCALC 127 (H) 03/22/2018   Lab Results  Component Value Date   TSH 1.700 07/08/2018   TSH 2.730 03/22/2018    Therapeutic Level Labs: No results found for: LITHIUM No results found for: VALPROATE No components found for:  CBMZ  Current Medications: Current Outpatient Medications  Medication Sig Dispense Refill  . albuterol (VENTOLIN HFA) 108 (90 Base) MCG/ACT inhaler Inhale 1 puff into the lungs every 6 (six) hours as needed for wheezing or shortness of breath. (Patient not taking: Reported on 04/10/2019) 8 g 0  . calcium carbonate (TUMS - DOSED IN MG ELEMENTAL CALCIUM) 500 MG chewable tablet Chew 1 tablet by mouth 2 (two) times daily as needed for indigestion or heartburn.    . fluconazole (DIFLUCAN) 150 MG tablet Take 1 tablet (150 mg total) by mouth daily. Repeat in 24 hours if needed 2 tablet 2  . gabapentin (NEURONTIN) 300 MG capsule TAKE 1 CAPSULE (300 MG TOTAL) BY MOUTH 3 (THREE) TIMES DAILY. MAY USE 2 AT BEDTIME 270 capsule 1  . HYDROcodone-acetaminophen (NORCO/VICODIN) 5-325 MG tablet Take 1-2 tablets by mouth every 6 (six) hours as needed. 5 tablet 0  . levonorgestrel (MIRENA) 20 MCG/24HR IUD 1 each by Intrauterine route once. Implanted winter 2017    . lisinopril (ZESTRIL) 20 MG tablet TAKE 1 TABLET BY MOUTH TWICE A DAY 180 tablet 1  . LORazepam (ATIVAN) 1 MG tablet Take 1 tablet (1 mg total) by mouth every 6 (six) hours as needed for anxiety. 120 tablet 1  . meclizine (ANTIVERT) 25 MG tablet Take 1 tablet (25 mg total) by mouth 3 (three) times daily as needed for dizziness. (Patient not taking: Reported on 04/10/2019) 30 tablet 0  . ondansetron (ZOFRAN-ODT) 4 MG disintegrating  tablet Take 1 tablet (4 mg total) by mouth every 8 (eight) hours as needed for nausea or vomiting. 20 tablet 0  . phenazopyridine (PYRIDIUM) 200 MG tablet Take 1 tablet (200 mg total) by mouth 3 (three) times daily as needed for pain. 10 tablet 0  . predniSONE (DELTASONE) 10 MG tablet Take 2 tablets daily for the first 7 days, then take 1 tablet daily for the next 7 days. 21 tablet 0  . sertraline (ZOLOFT) 100 MG tablet Take 2 tablets (200 mg total) by mouth daily. 180 tablet 0  . tamsulosin (FLOMAX) 0.4 MG CAPS capsule Take 1 capsule (0.4 mg total) by mouth daily. 14 capsule 0   No current facility-administered medications for  this visit.    Psychiatric Specialty Exam: Review of Systems  Psychiatric/Behavioral: The patient is nervous/anxious.   All other systems reviewed and are negative.   There were no vitals taken for this visit.There is no height or weight on file to calculate BMI.  General Appearance: NA  Eye Contact:  NA  Speech:  Clear and Coherent and Normal Rate  Volume:  Normal  Mood:  Anxious  Affect:  NA  Thought Process:  Goal Directed  Orientation:  Full (Time, Place, and Person)  Thought Content: Rumination   Suicidal Thoughts:  No  Homicidal Thoughts:  No  Memory:  Immediate;   Good Recent;   Good Remote;   Good  Judgement:  Good  Insight:  Good  Psychomotor Activity:  NA  Concentration:  Concentration: Good  Recall:  Good  Fund of Knowledge: Good  Language: Good  Akathisia:  Negative  Handed:  Right  AIMS (if indicated): not done  Assets:  Communication Skills Desire for Improvement Financial Resources/Insurance Housing Resilience Social Support Talents/Skills  ADL's:  Intact  Cognition: WNL  Sleep:  Fair   Screenings: GAD-7     Office Visit from 04/10/2019 in Center for [facility] Office Visit from 07/08/2018 in Primary Care at [facility] Visit from 04/29/2018 in Primary Care at [facility] Visit from 03/28/2018 in Primary  Care at [facility] Visit from 03/22/2018 in Primary Care at [facility]  Total GAD-7 Score 7 11 7 11 11     PHQ2-9     Office Visit from 04/10/2019 in Center for [facility] Office Visit from 07/28/2018 in Primary Care at [facility] Visit from 07/08/2018 in Primary Care at [facility] Visit from 04/29/2018 in Primary Care at [facility] Visit from 03/28/2018 in Primary Care at [facility] Total Score 2 0 3 1 0  PHQ-9 Total Score 5 6 12 8  --       Assessment and Plan: 39yo married female with generalized and panic type anxiety. She has episodic panic attacks and excessive worrying (about multiple physical ailments/complaints -abdominal pain,vertigo,headaches, heartburn). No depression, no SI. She is onZoloft(200 mg)as well. In the past on 1 mg tid of alprazolam, then1 mg tid of lorazepam for panic attacks. She had been working at [employer] time. 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 She is still anxious but at the same time relieved that she is done with previous job. Goal would be to gradually come off all her antianxiety medsbut at this time her anxiety is too high to start doing that.   Dx: GAD/Panic disorder  Plan:ContinueZoloft 200 mg dailyand lorazepam 1 mg q 6 hours prn anxiety.The plan was discussed with patient who had an opportunity to ask questions and these were all answered. I spend49minuteson the phonewith the patient.Next visit in2 months.The plan was discussed with patient who had an opportunity to ask questions and these were all answered.   [employer], MD 12/14/2019, 8:15 AM

## 2020-01-19 ENCOUNTER — Telehealth (HOSPITAL_COMMUNITY): Payer: No Typology Code available for payment source | Admitting: Psychiatry

## 2020-02-22 ENCOUNTER — Telehealth (INDEPENDENT_AMBULATORY_CARE_PROVIDER_SITE_OTHER): Payer: No Typology Code available for payment source | Admitting: Psychiatry

## 2020-02-22 ENCOUNTER — Other Ambulatory Visit: Payer: Self-pay

## 2020-02-22 DIAGNOSIS — F410 Panic disorder [episodic paroxysmal anxiety] without agoraphobia: Secondary | ICD-10-CM | POA: Diagnosis not present

## 2020-02-22 DIAGNOSIS — F411 Generalized anxiety disorder: Secondary | ICD-10-CM | POA: Diagnosis not present

## 2020-02-22 MED ORDER — SERTRALINE HCL 100 MG PO TABS
200.0000 mg | ORAL_TABLET | Freq: Every day | ORAL | 0 refills | Status: DC
Start: 2020-03-01 — End: 2020-04-24

## 2020-02-22 MED ORDER — LORAZEPAM 1 MG PO TABS: 1.0000 mg | ORAL_TABLET | Freq: Three times a day (TID) | ORAL | 1 refills | Status: DC | PRN

## 2020-02-22 NOTE — Progress Notes (Signed)
BH MD/PA/NP OP Progress Note  02/22/2020 8:14 AM Jennifer Salazar  MRN:  [id number] Interview was conducted by phone and I verified that I was speaking with the correct person using two identifiers. I discussed the limitations of evaluation and management by telemedicine and  the availability of in person appointments. Patient expressed understanding and agreed to proceed. Participants in the visit: patient (location - home); physician (location - home office).  Chief Complaint: Panic attacks.  HPI: 40yo married female with generalized and panic type anxiety. She has episodic panic attacks and excessive worrying (about multiple physical ailments/complaints -abdominal pain,vertigo,headaches, heartburn). No depression, no SI. She is onZoloft(200 mg)as well. In the past on 1 mg tid of alprazolam, then1 mg tid of lorazepam for panic attacks.She had been working at [employer] time. Shestarted to feel more anxious, frequent panic attacks as she perceives being closely monitored by her supervisors given past instances of being on short term disability and recently being out of work briefly.She eventually quit her job. 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 She is still anxious but frequency of panic attacks decreased. Goal would be to gradually come off all her antianxiety medsbut at this time we will decrease the number of [rn lorazepam to tid from qid. Jennifer Salazar had a carpal tunnel release surgery done last week.    Visit Diagnosis:    ICD-10-CM   1. Generalized anxiety disorder  F41.1   2. Panic disorder  F41.0     Past Psychiatric History: Please see intake H&P.  Past Medical History:  Past Medical History:  Diagnosis Date  . Anxiety   . Hypertension   . Ovarian cyst     Past Surgical History:  Procedure Laterality Date  . WISDOM TOOTH EXTRACTION      Family Psychiatric History: Reviewed.  Family History:  Family History  Problem Relation Age  of Onset  . Hypertension Mother   . COPD Father   . Crohn's disease Father   . Anxiety disorder Father   . Drug abuse Sister   . Anxiety disorder Brother     Social History:  Social History   Socioeconomic History  . Marital status: Married    Spouse name: Jennifer Salazar  . Number of children: 0  . Years of education: Not on file  . Highest education level: Some college, no degree  Occupational History  . Not on file  Tobacco Use  . Smoking status: Former Smoker    Packs/day: 0.25    Years: 10.00    Pack years: 2.50    Types: Cigarettes    Quit date: 11/08/2017    Years since quitting: 2.2  . Smokeless tobacco: Never Used  Vaping Use  . Vaping Use: Never used  Substance and Sexual Activity  . Alcohol use: Not Currently  . Drug use: Never  . Sexual activity: Yes    Birth control/protection: I.U.D.  Other Topics Concern  . Not on file  Social History Narrative  . Not on file   Social Determinants of Health   Financial Resource Strain: Not on file  Food Insecurity: Not on file  Transportation Needs: Not on file  Physical Activity: Not on file  Stress: Not on file  Social Connections: Not on file    Allergies:  Allergies  Allergen Reactions  . Promethazine Other (See Comments)  . Benadryl [Diphenhydramine] Other (See Comments)    Internal restlessness  . Reglan [Metoclopramide] Other (See Comments)    Hyperactivity,  skin crawled    Metabolic Disorder Labs: No results found for: HGBA1C, MPG No results found for: PROLACTIN Lab Results  Component Value Date   CHOL 191 03/22/2018   TRIG 92 03/22/2018   HDL 46 03/22/2018   CHOLHDL 4.2 03/22/2018   LDLCALC 127 (H) 03/22/2018   Lab Results  Component Value Date   TSH 1.700 07/08/2018   TSH 2.730 03/22/2018    Therapeutic Level Labs: No results found for: LITHIUM No results found for: VALPROATE No components found for:  CBMZ  Current Medications: Current Outpatient Medications  Medication Sig  Dispense Refill  . [START ON 03/12/2020] LORazepam (ATIVAN) 1 MG tablet Take 1 tablet (1 mg total) by mouth 3 (three) times daily as needed for anxiety. 90 tablet 1  . albuterol (VENTOLIN HFA) 108 (90 Base) MCG/ACT inhaler Inhale 1 puff into the lungs every 6 (six) hours as needed for wheezing or shortness of breath. (Patient not taking: Reported on 04/10/2019) 8 g 0  . calcium carbonate (TUMS - DOSED IN MG ELEMENTAL CALCIUM) 500 MG chewable tablet Chew 1 tablet by mouth 2 (two) times daily as needed for indigestion or heartburn.    . fluconazole (DIFLUCAN) 150 MG tablet Take 1 tablet (150 mg total) by mouth daily. Repeat in 24 hours if needed 2 tablet 2  . gabapentin (NEURONTIN) 300 MG capsule TAKE 1 CAPSULE (300 MG TOTAL) BY MOUTH 3 (THREE) TIMES DAILY. MAY USE 2 AT BEDTIME 270 capsule 1  . HYDROcodone-acetaminophen (NORCO/VICODIN) 5-325 MG tablet Take 1-2 tablets by mouth every 6 (six) hours as needed. 5 tablet 0  . levonorgestrel (MIRENA) 20 MCG/24HR IUD 1 each by Intrauterine route once. Implanted winter 2017    . lisinopril (ZESTRIL) 20 MG tablet TAKE 1 TABLET BY MOUTH TWICE A DAY 180 tablet 1  . meclizine (ANTIVERT) 25 MG tablet Take 1 tablet (25 mg total) by mouth 3 (three) times daily as needed for dizziness. (Patient not taking: Reported on 04/10/2019) 30 tablet 0  . ondansetron (ZOFRAN-ODT) 4 MG disintegrating tablet Take 1 tablet (4 mg total) by mouth every 8 (eight) hours as needed for nausea or vomiting. 20 tablet 0  . phenazopyridine (PYRIDIUM) 200 MG tablet Take 1 tablet (200 mg total) by mouth 3 (three) times daily as needed for pain. 10 tablet 0  . predniSONE (DELTASONE) 10 MG tablet Take 2 tablets daily for the first 7 days, then take 1 tablet daily for the next 7 days. 21 tablet 0  . [START ON 03/01/2020] sertraline (ZOLOFT) 100 MG tablet Take 2 tablets (200 mg total) by mouth daily. 180 tablet 0  . tamsulosin (FLOMAX) 0.4 MG CAPS capsule Take 1 capsule (0.4 mg total) by mouth daily. 14  capsule 0   No current facility-administered medications for this visit.      Psychiatric Specialty Exam: Review of Systems  Psychiatric/Behavioral: The patient is nervous/anxious.   All other systems reviewed and are negative.   There were no vitals taken for this visit.There is no height or weight on file to calculate BMI.  General Appearance: NA  Eye Contact:  NA  Speech:  Clear and Coherent and Normal Rate  Volume:  Normal  Mood:  Anxious  Affect:  NA  Thought Process:  Goal Directed and Linear  Orientation:  Full (Time, Place, and Person)  Thought Content: Logical   Suicidal Thoughts:  No  Homicidal Thoughts:  No  Memory:  Immediate;   Good Recent;   Good Remote;   Good  Judgement:  Good  Insight:  Good  Psychomotor Activity:  NA  Concentration:  Concentration: Good  Recall:  Good  Fund of Knowledge: Good  Language: Good  Akathisia:  Negative  Handed:  Right  AIMS (if indicated): not done  Assets:  Communication Skills Desire for Improvement Financial Resources/Insurance Housing Social Support Talents/Skills  ADL's:  Intact  Cognition: WNL  Sleep:  Good   Screenings: GAD-7   Flowsheet Row Office Visit from 04/10/2019 in [facility] Office Visit from 07/08/2018 in Primary Care at [facility] Visit from 04/29/2018 in Primary Care at [facility] Visit from 03/28/2018 in Primary Care at [facility] Visit from 03/22/2018 in Primary Care at [facility]  Total GAD-7 Score 7 11 7 11 11     PHQ2-9   Flowsheet Row Office Visit from 04/10/2019 in Center for [facility] Office Visit from 07/28/2018 in Primary Care at [facility] Visit from 07/08/2018 in Primary Care at [facility] Visit from 04/29/2018 in Primary Care at [facility] Visit from 03/28/2018 in Primary Care at [facility] Total Score 2 0 3 1 0  PHQ-9 Total Score 5 6 12 8  --       Assessment and Plan: 39yo married female with generalized and panic type  anxiety. She has episodic panic attacks and excessive worrying (about multiple physical ailments/complaints -abdominal pain,vertigo,headaches, heartburn). No depression, no SI. She is onZoloft(200 mg)as well. In the past on 1 mg tid of alprazolam, then1 mg tid of lorazepam for panic attacks.She had been working at [employer] time. Shestarted to feel more anxious, frequent panic attacks as she perceives being closely monitored by her supervisors given past instances of being on short term disability and recently being out of work briefly.She eventually quit her job. 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 She is still anxious but frequency of panic attacks decreased. Goal would be to gradually come off all her antianxiety medsbut at this time we will decrease the number of [rn lorazepam to tid from qid. Deysha had a carpal tunnel release surgery done last week.  Dx: GAD/Panic disorder  Plan:ContinueZoloft 200 mg dailyand decrease lorazepam 1 mg to tid prn anxiety.The plan was discussed with patient who had an opportunity to ask questions and these were all answered. I spend66minuteson the phonewith the patient.Next visit in2 months.The plan was discussed with patient who had an opportunity to ask questions and these were all answered.   Maralyn Sago, MD 02/22/2020, 8:14 AM

## 2020-04-24 ENCOUNTER — Other Ambulatory Visit: Payer: Self-pay

## 2020-04-24 ENCOUNTER — Telehealth (INDEPENDENT_AMBULATORY_CARE_PROVIDER_SITE_OTHER): Payer: Self-pay | Admitting: Psychiatry

## 2020-04-24 DIAGNOSIS — F411 Generalized anxiety disorder: Secondary | ICD-10-CM

## 2020-04-24 DIAGNOSIS — F410 Panic disorder [episodic paroxysmal anxiety] without agoraphobia: Secondary | ICD-10-CM

## 2020-04-24 MED ORDER — ALPRAZOLAM 1 MG PO TABS: 1.0000 mg | ORAL_TABLET | Freq: Three times a day (TID) | ORAL | 1 refills | Status: DC | PRN

## 2020-04-24 MED ORDER — SERTRALINE HCL 100 MG PO TABS
200.0000 mg | ORAL_TABLET | Freq: Every day | ORAL | 0 refills | Status: DC
Start: 2020-05-25 — End: 2020-06-19

## 2020-04-24 NOTE — Progress Notes (Signed)
BH MD/PA/NP OP Progress Note  04/24/2020 8:15 AM Jennifer Salazar  MRN:  [id number] Interview was conducted by phone and I verified that I was speaking with the correct person using two identifiers. I discussed the limitations of evaluation and management by telemedicine and  the availability of in person appointments. Patient expressed understanding and agreed to proceed. Participants in the visit: patient (location - home); physician (location - home office).  Chief Complaint: "Still having panic attacks".  HPI: 40yo married female with generalized and panic type anxiety. She has episodic panic attacks and excessive worrying (about multiple physical ailments/complaints, recent temporary loss of her cat). No depression, no SI. She is onZoloft(200 mg)as well as lorazepam prn anxiety. In the past on 1 mg tid of alprazolam which she now thinks might have worked quicker than lorazepam. Niel Hummer been workingat [employer] time but her severe anxiety, frequent panic attacks caused her to miss work often. She perceived being closely monitored by her supervisors given past instances of being on short term disability and and she eventually quit her job. She has a new job at [employer]. She is still anxious but frequency of panic attacks decreased until her cat got lost recently (she has been found since). Ultimate goal would be to gradually come off all her antianxiety medsbut at this time she would like to change lorazepam back to alprazolam tid (was on qid just few months ago).   Visit Diagnosis:    ICD-10-CM   1. Generalized anxiety disorder  F41.1   2. Panic disorder  F41.0     Past Psychiatric History: Please see intake H&P.  Past Medical History:  Past Medical History:  Diagnosis Date  . Anxiety   . Hypertension   . Ovarian cyst     Past Surgical History:  Procedure Laterality Date  . WISDOM TOOTH EXTRACTION      Family Psychiatric History: Reviewed.  Family History:   Family History  Problem Relation Age of Onset  . Hypertension Mother   . COPD Father   . Crohn's disease Father   . Anxiety disorder Father   . Drug abuse Sister   . Anxiety disorder Brother     Social History:  Social History   Socioeconomic History  . Marital status: Married    Spouse name: Otilio Saber  . Number of children: 0  . Years of education: Not on file  . Highest education level: Some college, no degree  Occupational History  . Not on file  Tobacco Use  . Smoking status: Former Smoker    Packs/day: 0.25    Years: 10.00    Pack years: 2.50    Types: Cigarettes    Quit date: 11/08/2017    Years since quitting: 2.4  . Smokeless tobacco: Never Used  Vaping Use  . Vaping Use: Never used  Substance and Sexual Activity  . Alcohol use: Not Currently  . Drug use: Never  . Sexual activity: Yes    Birth control/protection: I.U.D.  Other Topics Concern  . Not on file  Social History Narrative  . Not on file   Social Determinants of Health   Financial Resource Strain: Not on file  Food Insecurity: Not on file  Transportation Needs: Not on file  Physical Activity: Not on file  Stress: Not on file  Social Connections: Not on file    Allergies:  Allergies  Allergen Reactions  . Promethazine Other (See Comments)  . Benadryl [Diphenhydramine] Other (See Comments)  Internal restlessness  . Reglan [Metoclopramide] Other (See Comments)    Hyperactivity, skin crawled    Metabolic Disorder Labs: No results found for: HGBA1C, MPG No results found for: PROLACTIN Lab Results  Component Value Date   CHOL 191 03/22/2018   TRIG 92 03/22/2018   HDL 46 03/22/2018   CHOLHDL 4.2 03/22/2018   LDLCALC 127 (H) 03/22/2018   Lab Results  Component Value Date   TSH 1.700 07/08/2018   TSH 2.730 03/22/2018    Therapeutic Level Labs: No results found for: LITHIUM No results found for: VALPROATE No components found for:  CBMZ  Current Medications: Current  Outpatient Medications  Medication Sig Dispense Refill  . ALPRAZolam (XANAX) 1 MG tablet Take 1 tablet (1 mg total) by mouth 3 (three) times daily as needed for anxiety. 90 tablet 1  . albuterol (VENTOLIN HFA) 108 (90 Base) MCG/ACT inhaler Inhale 1 puff into the lungs every 6 (six) hours as needed for wheezing or shortness of breath. (Patient not taking: Reported on 04/10/2019) 8 g 0  . calcium carbonate (TUMS - DOSED IN MG ELEMENTAL CALCIUM) 500 MG chewable tablet Chew 1 tablet by mouth 2 (two) times daily as needed for indigestion or heartburn.    . fluconazole (DIFLUCAN) 150 MG tablet Take 1 tablet (150 mg total) by mouth daily. Repeat in 24 hours if needed 2 tablet 2  . gabapentin (NEURONTIN) 300 MG capsule TAKE 1 CAPSULE (300 MG TOTAL) BY MOUTH 3 (THREE) TIMES DAILY. MAY USE 2 AT BEDTIME 270 capsule 1  . HYDROcodone-acetaminophen (NORCO/VICODIN) 5-325 MG tablet Take 1-2 tablets by mouth every 6 (six) hours as needed. 5 tablet 0  . levonorgestrel (MIRENA) 20 MCG/24HR IUD 1 each by Intrauterine route once. Implanted winter 2017    . lisinopril (ZESTRIL) 20 MG tablet TAKE 1 TABLET BY MOUTH TWICE A DAY 180 tablet 1  . meclizine (ANTIVERT) 25 MG tablet Take 1 tablet (25 mg total) by mouth 3 (three) times daily as needed for dizziness. (Patient not taking: Reported on 04/10/2019) 30 tablet 0  . ondansetron (ZOFRAN-ODT) 4 MG disintegrating tablet Take 1 tablet (4 mg total) by mouth every 8 (eight) hours as needed for nausea or vomiting. 20 tablet 0  . phenazopyridine (PYRIDIUM) 200 MG tablet Take 1 tablet (200 mg total) by mouth 3 (three) times daily as needed for pain. 10 tablet 0  . predniSONE (DELTASONE) 10 MG tablet Take 2 tablets daily for the first 7 days, then take 1 tablet daily for the next 7 days. 21 tablet 0  . [START ON 05/25/2020] sertraline (ZOLOFT) 100 MG tablet Take 2 tablets (200 mg total) by mouth daily. 180 tablet 0  . tamsulosin (FLOMAX) 0.4 MG CAPS capsule Take 1 capsule (0.4 mg total)  by mouth daily. 14 capsule 0   No current facility-administered medications for this visit.      Psychiatric Specialty Exam: Review of Systems  Psychiatric/Behavioral: The patient is nervous/anxious.   All other systems reviewed and are negative.   There were no vitals taken for this visit.There is no height or weight on file to calculate BMI.  General Appearance: NA  Eye Contact:  NA  Speech:  Clear and Coherent and Normal Rate  Volume:  Normal  Mood:  Anxious  Affect:  NA  Thought Process:  Goal Directed and Linear  Orientation:  Full (Time, Place, and Person)  Thought Content: Rumination   Suicidal Thoughts:  No  Homicidal Thoughts:  No  Memory:  Immediate;  Good Recent;   Good Remote;   Good  Judgement:  Good  Insight:  Good  Psychomotor Activity:  NA  Concentration:  Concentration: Good  Recall:  Good  Fund of Knowledge: Good  Language: Good  Akathisia:  Negative  Handed:  Right  AIMS (if indicated): not done  Assets:  Communication Skills Desire for Improvement Housing Social Support Talents/Skills  ADL's:  Intact  Cognition: WNL  Sleep:  Fair   Screenings: GAD-7   Flowsheet Row Office Visit from 04/10/2019 in Center for [facility] Office Visit from 07/08/2018 in Primary Care at [facility] Visit from 04/29/2018 in Primary Care at [facility] Visit from 03/28/2018 in Primary Care at [facility] Visit from 03/22/2018 in Primary Care at [facility]  Total GAD-7 Score 7 11 7 11 11     PHQ2-9   Flowsheet Row Office Visit from 04/10/2019 in [facility] for [facility] Office Visit from 07/28/2018 in Primary Care at [facility] Visit from 07/08/2018 in Primary Care at [facility] Visit from 04/29/2018 in Primary Care at [facility] Visit from 03/28/2018 in Primary Care at [facility] Total Score 2 0 3 1 0  PHQ-9 Total Score 5 6 12 8  --       Assessment and Plan: 40yo married female with generalized and panic type anxiety. She  has episodic panic attacks and excessive worrying (about multiple physical ailments/complaints, recent temporary loss of her cat). No depression, no SI. She is onZoloft(200 mg)as well as lorazepam prn anxiety. In the past on 1 mg tid of alprazolam which she now thinks might have worked quicker than lorazepam. been workingat [employer] time but her severe anxiety, frequent panic attacks caused her to miss work often. She perceived being closely monitored by her supervisors given past instances of being on short term disability and and she eventually quit her job. She has a new job at 41yo. She is still anxious but frequency of panic attacks decreased until her cat got lost recently (she has been found since). Ultimate goal would be to gradually come off all her antianxiety medsbut at this time she would like to change lorazepam back to alprazolam tid (was on qid just few months ago).   Dx: GAD/Panic disorder  Plan:ContinueZoloft 200 mg dailyandstart alprazolam 1 mg tid prn anxiety instead of lorazepam 1 mg.The plan was discussed with patient who had an opportunity to ask questions and these were all answered. I spend61minuteson the phonewith the patient.Next visit in6 weeks with a new provider.The planwas discussed with patient who had an opportunity to ask questions and these were all answered.    [place], MD 04/24/2020, 8:15 AM

## 2020-06-19 ENCOUNTER — Telehealth (INDEPENDENT_AMBULATORY_CARE_PROVIDER_SITE_OTHER): Payer: Self-pay | Admitting: Psychiatry

## 2020-06-19 ENCOUNTER — Encounter (HOSPITAL_COMMUNITY): Payer: Self-pay | Admitting: Psychiatry

## 2020-06-19 ENCOUNTER — Other Ambulatory Visit: Payer: Self-pay

## 2020-06-19 DIAGNOSIS — F410 Panic disorder [episodic paroxysmal anxiety] without agoraphobia: Secondary | ICD-10-CM

## 2020-06-19 DIAGNOSIS — F411 Generalized anxiety disorder: Secondary | ICD-10-CM

## 2020-06-19 MED ORDER — ALPRAZOLAM 1 MG PO TABS: 1.0000 mg | ORAL_TABLET | Freq: Two times a day (BID) | ORAL | 0 refills | Status: DC | PRN

## 2020-06-19 MED ORDER — SERTRALINE HCL 100 MG PO TABS: 200.0000 mg | ORAL_TABLET | Freq: Every day | ORAL | 0 refills | Status: AC
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
## 2020-07-16 ENCOUNTER — Telehealth (HOSPITAL_COMMUNITY): Payer: Self-pay | Admitting: *Deleted

## 2020-07-16 MED ORDER — ALPRAZOLAM 1 MG PO TABS: 1.0000 mg | ORAL_TABLET | Freq: Two times a day (BID) | ORAL | 0 refills | Status: AC | PRN

## 2020-07-16 NOTE — Telephone Encounter (Signed)
Call to patient to cancel appt and to advise that we could no longer provide psychiatric services to patient..  Patient verbalized understanding and was advised a letter with resources would be provided.    Patient requested refill of Xanax to last 30 days.

## 2020-07-16 NOTE — Telephone Encounter (Signed)
A thirty days bridge supply of Xanax 1 mg to take twice a day is given by Covering [facility]. Patient must established care with new provider.  Please send a letter.

## 2020-07-17 ENCOUNTER — Emergency Department (HOSPITAL_COMMUNITY)
Admission: EM | Admit: 2020-07-17 | Discharge: 2020-07-18 | Disposition: A | Discharge status: Home / Self Care | Payer: Self-pay | Attending: Emergency Medicine | Admitting: Emergency Medicine

## 2020-07-17 ENCOUNTER — Emergency Department (HOSPITAL_COMMUNITY): Payer: Self-pay

## 2020-07-17 ENCOUNTER — Encounter (HOSPITAL_COMMUNITY): Payer: Self-pay | Admitting: Emergency Medicine

## 2020-07-17 ENCOUNTER — Other Ambulatory Visit: Payer: Self-pay

## 2020-07-17 DIAGNOSIS — I10 Essential (primary) hypertension: Secondary | ICD-10-CM | POA: Insufficient documentation

## 2020-07-17 DIAGNOSIS — R1031 Right lower quadrant pain: Secondary | ICD-10-CM

## 2020-07-17 DIAGNOSIS — R000 Tachycardia, unspecified: Secondary | ICD-10-CM | POA: Insufficient documentation

## 2020-07-17 DIAGNOSIS — R102 Pelvic and perineal pain: Secondary | ICD-10-CM | POA: Insufficient documentation

## 2020-07-17 DIAGNOSIS — Z79899 Other long term (current) drug therapy: Secondary | ICD-10-CM | POA: Insufficient documentation

## 2020-07-17 DIAGNOSIS — Z87891 Personal history of nicotine dependence: Secondary | ICD-10-CM | POA: Insufficient documentation

## 2020-07-17 MED ORDER — FENTANYL CITRATE (PF) 100 MCG/2ML IJ SOLN
50.0000 ug | Freq: Once | INTRAMUSCULAR | Status: AC
  Administered 2020-07-17: 50 ug via INTRAVENOUS
  Filled 2020-07-17: qty 2

## 2020-07-17 MED ORDER — SODIUM CHLORIDE 0.9 % IV BOLUS
1000.0000 mL | Freq: Once | INTRAVENOUS | Status: AC
  Administered 2020-07-17: 1000 mL via INTRAVENOUS

## 2020-07-17 NOTE — ED Provider Notes (Signed)
[facility]-EMERGENCY DEPT Provider Note   CSN: [id number] Arrival date & time: 07/17/20  1756     History Chief Complaint  Patient presents with  . Pelvic Pain    Jennifer Salazar is a 40 y.o. female.  Patient to ED for evaluation of pelvic pain that has been progressively worsening over the last one week and favors the right side. No nausea, vomiting or fever. No urinary symptoms. She reports history of frequent pelvic and abdominal pain that is different from current symptoms. She reports vaginal discharge that was evaluated by her doctor today and was diagnosed as a BV infection, but comes to the ED because she does not feel her symptoms were fully evaluated. No vaginal bleeding, LMP x 5 years ago secondary to IUD. No diarrhea or constipation.   The history is provided by the patient. No language interpreter was used.  Pelvic Pain Associated symptoms include abdominal pain. Pertinent negatives include no chest pain and no shortness of breath.       Past Medical History:  Diagnosis Date  . Anxiety   . Hypertension   . Ovarian cyst     Patient Active Problem List   Diagnosis Date Noted  . Pelvic pain 04/10/2019  . Hemorrhagic cyst of ovary 09/07/2018  . Panic disorder 04/26/2018  . Generalized anxiety disorder 03/26/2018  . Bladder problem 11/06/2016  . Hypertensive disorder 11/06/2016  . Gastroesophageal reflux disease without esophagitis 11/06/2016    Past Surgical History:  Procedure Laterality Date  . WISDOM TOOTH EXTRACTION       OB History    Gravida  0   Para  0   Term  0   Preterm  0   AB  0   Living  0     SAB  0   IAB  0   Ectopic  0   Multiple  0   Live Births  0           Family History  Problem Relation Age of Onset  . Hypertension Mother   . COPD Father   . Crohn's disease Father   . Anxiety disorder Father   . Drug abuse Sister   . Anxiety disorder Brother     Social History   Tobacco Use  .  Smoking status: Former Smoker    Packs/day: 0.25    Years: 10.00    Pack years: 2.50    Types: Cigarettes    Quit date: 11/08/2017    Years since quitting: 2.6  . Smokeless tobacco: Never Used  Vaping Use  . Vaping Use: Never used  Substance Use Topics  . Alcohol use: Not Currently  . Drug use: Never    Home Medications Prior to Admission medications   Medication Sig Start Date End Date Taking? Authorizing Provider  albuterol (VENTOLIN HFA) 108 (90 Base) MCG/ACT inhaler Inhale 1 puff into the lungs every 6 (six) hours as needed for wheezing or shortness of breath. Patient not taking: Reported on 04/10/2019 01/07/19   Dietrich PatesKhatri, Hina, PA-C  ALPRAZolam Prudy Feeler(XANAX) 1 MG tablet Take 1 tablet (1 mg total) by mouth 2 (two) times daily as needed for anxiety. 07/16/20 09/14/20  Arfeen, Phillips GroutSyed T, MD  calcium carbonate (TUMS - DOSED IN MG ELEMENTAL CALCIUM) 500 MG chewable tablet Chew 1 tablet by mouth 2 (two) times daily as needed for indigestion or heartburn.    [provider]  fluconazole (DIFLUCAN) 150 MG tablet Take 1 tablet (150 mg total) by mouth  daily. Repeat in 24 hours if needed 04/12/19   Reva Bores, MD  gabapentin (NEURONTIN) 300 MG capsule TAKE 1 CAPSULE (300 MG TOTAL) BY MOUTH 3 (THREE) TIMES DAILY. MAY USE 2 AT BEDTIME 09/04/19   Anyanwu, Jethro Bastos, MD  HYDROcodone-acetaminophen (NORCO/VICODIN) 5-325 MG tablet Take 1-2 tablets by mouth every 6 (six) hours as needed. 08/08/19   Roxy Horseman, PA-C  levonorgestrel (MIRENA) 20 MCG/24HR IUD 1 each by Intrauterine route once. Implanted winter 2017    [provider]  lisinopril (ZESTRIL) 20 MG tablet TAKE 1 TABLET BY MOUTH TWICE A DAY 03/06/19   Lezlie Lye, Meda Coffee, MD  meclizine (ANTIVERT) 25 MG tablet Take 1 tablet (25 mg total) by mouth 3 (three) times daily as needed for dizziness. Patient not taking: Reported on 04/10/2019 01/07/19   Dietrich Pates, PA-C  ondansetron (ZOFRAN-ODT) 4 MG disintegrating tablet Take 1 tablet (4 mg  total) by mouth every 8 (eight) hours as needed for nausea or vomiting. 11/17/19   Pucilowski, Roosvelt Maser, MD  phenazopyridine (PYRIDIUM) 200 MG tablet Take 1 tablet (200 mg total) by mouth 3 (three) times daily as needed for pain. 02/01/19   Little, Ambrose Finland, MD  predniSONE (DELTASONE) 10 MG tablet Take 2 tablets daily for the first 7 days, then take 1 tablet daily for the next 7 days. 08/08/19   Roxy Horseman, PA-C  sertraline (ZOLOFT) 100 MG tablet Take 2 tablets (200 mg total) by mouth daily. 06/19/20   Patrick North, MD  tamsulosin (FLOMAX) 0.4 MG CAPS capsule Take 1 capsule (0.4 mg total) by mouth daily. 02/01/19   Little, Ambrose Finland, MD  pantoprazole (PROTONIX) 40 MG tablet Take 1 tablet (40 mg total) by mouth daily. Patient not taking: Reported on 09/05/2018 06/16/18 09/05/18  Tressia Danas, MD  potassium chloride SA (K-DUR) 20 MEQ tablet Take 1 tablet (20 mEq total) by mouth daily. Patient not taking: Reported on 09/05/2018 07/03/18 09/05/18  Loren Racer, MD    Allergies    Promethazine, Benadryl [diphenhydramine], and Reglan [metoclopramide]  Review of Systems   Review of Systems  Constitutional: Negative for fatigue and fever.  Respiratory: Negative for shortness of breath.   Cardiovascular: Negative for chest pain.  Gastrointestinal: Positive for abdominal pain.  Genitourinary: Positive for pelvic pain and vaginal discharge. Negative for dysuria and vaginal bleeding.  Neurological: Negative for light-headedness.    Physical Exam Updated Vital Signs BP (!) 125/96 (BP Location: Left Arm)   Pulse (!) 121   Temp 97.8 F (36.6 C) (Oral)   Resp 16   Ht 5\' 2"  (1.575 m)   Wt 83.9 kg   SpO2 100%   BMI 33.84 kg/m   Physical Exam Vitals and nursing note reviewed.  Constitutional:      Appearance: She is well-developed.  HENT:     Head: Normocephalic.  Cardiovascular:     Rate and Rhythm: Regular rhythm. Tachycardia present.  Pulmonary:     Effort: Pulmonary  effort is normal.     Breath sounds: Normal breath sounds. No wheezing, rhonchi or rales.  Abdominal:     General: Bowel sounds are normal.     Palpations: Abdomen is soft.     Tenderness: There is abdominal tenderness. There is no guarding or rebound.    Musculoskeletal:        General: Normal range of motion.     Cervical back: Normal range of motion and neck supple.  Skin:    General: Skin is warm and dry.  Coloration: Skin is not pale.  Neurological:     Mental Status: She is alert and oriented to person, place, and time.     ED Results / Procedures / Treatments   Labs (all labs ordered are listed, but only abnormal results are displayed) Labs Reviewed  PREGNANCY, URINE  URINALYSIS, ROUTINE W REFLEX MICROSCOPIC   Results for orders placed or performed during the hospital encounter of 08/07/19  Protime-INR  Result Value Ref Range   Prothrombin Time 12.4 11.4 - 15.2 seconds   INR 1.0 0.8 - 1.2  APTT  Result Value Ref Range   aPTT 28 24 - 36 seconds  CBC  Result Value Ref Range   WBC 6.4 4.0 - 10.5 K/uL   RBC 4.59 3.87 - 5.11 MIL/uL   Hemoglobin 14.5 12.0 - 15.0 g/dL   HCT 47.4 25.9 - 56.3 %   MCV 95.4 80.0 - 100.0 fL   MCH 31.6 26.0 - 34.0 pg   MCHC 33.1 30.0 - 36.0 g/dL   RDW 87.5 64.3 - 32.9 %   Platelets 322 150 - 400 K/uL   nRBC 0.0 0.0 - 0.2 %  Differential  Result Value Ref Range   Neutrophils Relative % 72 %   Neutro Abs 4.6 1.7 - 7.7 K/uL   Lymphocytes Relative 20 %   Lymphs Abs 1.3 0.7 - 4.0 K/uL   Monocytes Relative 6 %   Monocytes Absolute 0.4 0.1 - 1.0 K/uL   Eosinophils Relative 2 %   Eosinophils Absolute 0.1 0.0 - 0.5 K/uL   Basophils Relative 0 %   Basophils Absolute 0.0 0.0 - 0.1 K/uL   Immature Granulocytes 0 %   Abs Immature Granulocytes 0.02 0.00 - 0.07 K/uL  Comprehensive metabolic panel  Result Value Ref Range   Sodium 139 135 - 145 mmol/L   Potassium 4.0 3.5 - 5.1 mmol/L   Chloride 109 98 - 111 mmol/L   CO2 21 (L) 22 - 32  mmol/L   Glucose, Bld 122 (H) 70 - 99 mg/dL   BUN 9 6 - 20 mg/dL   Creatinine, Ser 5.18 0.44 - 1.00 mg/dL   Calcium 9.3 8.9 - 84.1 mg/dL   Total Protein 6.7 6.5 - 8.1 g/dL   Albumin 3.6 3.5 - 5.0 g/dL   AST 22 15 - 41 U/L   ALT 22 0 - 44 U/L   Alkaline Phosphatase 65 38 - 126 U/L   Total Bilirubin 0.5 0.3 - 1.2 mg/dL   GFR calc non Af Amer >60 >60 mL/min   GFR calc Af Amer >60 >60 mL/min   Anion gap 9 5 - 15  I-stat chem 8, ED  Result Value Ref Range   Sodium 142 135 - 145 mmol/L   Potassium 3.9 3.5 - 5.1 mmol/L   Chloride 107 98 - 111 mmol/L   BUN 9 6 - 20 mg/dL   Creatinine, Ser 6.60 0.44 - 1.00 mg/dL   Glucose, Bld 630 (H) 70 - 99 mg/dL   Calcium, Ion 1.60 (L) 1.15 - 1.40 mmol/L   TCO2 22 22 - 32 mmol/L   Hemoglobin 14.3 12.0 - 15.0 g/dL   HCT 10.9 32.3 - 55.7 %  I-Stat beta hCG blood, ED  Result Value Ref Range   I-stat hCG, quantitative <5.0 <5 mIU/mL   Comment 3            EKG None  Radiology No results found.  Procedures Procedures   Medications Ordered in ED Medications  sodium  chloride 0.9 % bolus 1,000 mL (has no administration in time range)  fentaNYL (SUBLIMAZE) injection 50 mcg (has no administration in time range)    ED Course  I have reviewed the triage vital signs and the nursing notes.  Pertinent labs & imaging results that were available during my care of the patient were reviewed by me and considered in my medical decision making (see chart for details).    MDM Rules/Calculators/A&P                          Patient to ED with lower right abdominal/right pelvic pain as detailed in the HPI.   She reports being seen and evaluated earlier today, diagnosed with BV, had an ultrasound to evaluate for recurrent ovarian cyst, but was dissatisfied with the evaluation and presents here for further work up. No fever.   UA negative for infection. [place] repeated here and is negative for cyst or other acute finding. She is reporting RLQ tenderness. CT  ordered and confirms negative for appendicitis, kidney stone, other acute finding in the abdomen or pelvis.   Patient is given Fentanyl IV and reports this did not help her pain. Database reviewed and shows 12 narcotic prescriptions since the beginning of March for Percocet and hydrocodone. IV Toradol ordered.   Findings discussed with the patient and she is reassured. She can be discharged home and is encouraged to see her primary care doctor for further evaluation if pain continues. No new prescriptions provided.   Final Clinical Impression(s) / ED Diagnoses Final diagnoses:  None   1. Abdominal pain, RLQ  Rx / DC Orders ED Discharge Orders    None       Elpidio Anis, PA-C 07/18/20 0252    Mancel Bale, MD 07/20/20 1052

## 2020-07-17 NOTE — ED Triage Notes (Signed)
Pt arriving POV with pelvic pain and spotting. Pt describes pain as severe period cramps. Pt reports having IUD in place. Pt says she was seen previously for same and diagnosed with bacterial infection.

## 2020-07-17 NOTE — ED Provider Notes (Signed)
Emergency Medicine Provider Triage Evaluation Note  Jennifer Salazar , a 40 y.o. female  was evaluated in triage.  Pt complains of pelvic pain and is concerned that she may have cysts.  States that she has had an IUD in place for the past 5 years and typically does not experience abdominal cramping but this feels similar to menstrual cramps.  She has had some spotting as well.  Was diagnosed with bacterial vaginosis at her last office visit.  Denies any vomiting.  Symptoms began 2 weeks ago.  Review of Systems  Positive: Pelvic pain, vaginal spotting Negative: Vomiting  Physical Exam  There were no vitals taken for this visit. Gen:   Awake, no distress   Resp:  Normal effort  MSK:   Moves extremities without difficulty  Other:  Tenderness palpation of the pelvic area  Medical Decision Making  Medically screening exam initiated at 6:46 PM.  Appropriate orders placed.  Demetria Iwai was informed that the remainder of the evaluation will be completed by another provider, this initial triage assessment does not replace that evaluation, and the importance of remaining in the ED until their evaluation is complete.  Will need urinalysis, pregnancy test and ultrasound   Dietrich Pates, PA-C 07/17/20 1848    Terrilee Files, MD 07/19/20 [phone]

## 2020-07-18 ENCOUNTER — Emergency Department (HOSPITAL_COMMUNITY): Payer: Self-pay

## 2020-07-18 LAB — URINALYSIS, ROUTINE W REFLEX MICROSCOPIC
Bacteria, UA: NONE SEEN
Bilirubin Urine: NEGATIVE
Glucose, UA: NEGATIVE mg/dL
Ketones, ur: 80 mg/dL — AB
Nitrite: NEGATIVE
Protein, ur: NEGATIVE mg/dL
Specific Gravity, Urine: 1.028 (ref 1.005–1.030)
pH: 5 (ref 5.0–8.0)

## 2020-07-18 LAB — PREGNANCY, URINE: Preg Test, Ur: NEGATIVE

## 2020-07-18 MED ORDER — KETOROLAC TROMETHAMINE 30 MG/ML IJ SOLN
15.0000 mg | Freq: Once | INTRAMUSCULAR | Status: AC
  Administered 2020-07-18: 15 mg via INTRAVENOUS
  Filled 2020-07-18: qty 1

## 2020-07-18 NOTE — ED Notes (Signed)
Pt ambulated to the bathroom successfully

## 2020-07-18 NOTE — ED Notes (Signed)
Discharged with no concerns  

## 2020-07-18 NOTE — Discharge Instructions (Addendum)
Follow up with your doctor for recheck if pain persists.

## 2020-07-23 ENCOUNTER — Telehealth (HOSPITAL_COMMUNITY): Payer: Self-pay | Admitting: Psychiatry

## 2020-09-06 ENCOUNTER — Emergency Department (HOSPITAL_COMMUNITY)
Admission: EM | Admit: 2020-09-06 | Discharge: 2020-09-07 | Disposition: A | Discharge status: Home / Self Care | Payer: BC Managed Care – PPO | Attending: Emergency Medicine | Admitting: Emergency Medicine

## 2020-09-06 ENCOUNTER — Emergency Department (HOSPITAL_COMMUNITY): Payer: BC Managed Care – PPO

## 2020-09-06 ENCOUNTER — Encounter (HOSPITAL_COMMUNITY): Payer: Self-pay

## 2020-09-06 ENCOUNTER — Other Ambulatory Visit: Payer: Self-pay

## 2020-09-06 DIAGNOSIS — M79651 Pain in right thigh: Secondary | ICD-10-CM | POA: Diagnosis not present

## 2020-09-06 DIAGNOSIS — Z87891 Personal history of nicotine dependence: Secondary | ICD-10-CM | POA: Diagnosis not present

## 2020-09-06 DIAGNOSIS — M25551 Pain in right hip: Secondary | ICD-10-CM | POA: Insufficient documentation

## 2020-09-06 DIAGNOSIS — R102 Pelvic and perineal pain: Secondary | ICD-10-CM | POA: Insufficient documentation

## 2020-09-06 DIAGNOSIS — M79652 Pain in left thigh: Secondary | ICD-10-CM | POA: Insufficient documentation

## 2020-09-06 DIAGNOSIS — M25552 Pain in left hip: Secondary | ICD-10-CM | POA: Insufficient documentation

## 2020-09-06 DIAGNOSIS — I10 Essential (primary) hypertension: Secondary | ICD-10-CM | POA: Insufficient documentation

## 2020-09-06 DIAGNOSIS — R1030 Lower abdominal pain, unspecified: Secondary | ICD-10-CM | POA: Diagnosis not present

## 2020-09-06 LAB — I-STAT BETA HCG BLOOD, ED (MC, WL, AP ONLY): I-stat hCG, quantitative: <5 m[IU]/mL (ref <5)

## 2020-09-06 LAB — COMPREHENSIVE METABOLIC PANEL
ALT: 12 U/L (ref 0–44)
AST: 17 U/L (ref 15–41)
Albumin: 4.3 g/dL (ref 3.5–5.0)
Alkaline Phosphatase: 76 U/L (ref 38–126)
Anion gap: 9 (ref 5–15)
BUN: 10 mg/dL (ref 6–20)
CO2: 24 mmol/L (ref 22–32)
Calcium: 9.5 mg/dL (ref 8.9–10.3)
Chloride: 106 mmol/L (ref 98–111)
Creatinine, Ser: 0.79 mg/dL (ref 0.44–1.00)
GFR, Estimated: >60 mL/min (ref >60)
Glucose, Bld: 132 mg/dL — ABNORMAL HIGH (ref 70–99)
Potassium: 3.7 mmol/L (ref 3.5–5.1)
Sodium: 139 mmol/L (ref 135–145)
Total Bilirubin: 1.1 mg/dL (ref 0.3–1.2)
Total Protein: 7.4 g/dL (ref 6.5–8.1)

## 2020-09-06 LAB — CBC WITH DIFFERENTIAL/PLATELET
Abs Immature Granulocytes: 0.02 10*3/uL (ref 0.00–0.07)
Basophils Absolute: 0 10*3/uL (ref 0.0–0.1)
Basophils Relative: 0 %
Eosinophils Absolute: 0 10*3/uL (ref 0.0–0.5)
Eosinophils Relative: 0 %
HCT: 45 % (ref 36.0–46.0)
Hemoglobin: 15.7 g/dL — ABNORMAL HIGH (ref 12.0–15.0)
Immature Granulocytes: 0 %
Lymphocytes Relative: 18 %
Lymphs Abs: 1.4 10*3/uL (ref 0.7–4.0)
MCH: 33.4 pg (ref 26.0–34.0)
MCHC: 34.9 g/dL (ref 30.0–36.0)
MCV: 95.7 fL (ref 80.0–100.0)
Monocytes Absolute: 0.4 10*3/uL (ref 0.1–1.0)
Monocytes Relative: 4 %
Neutro Abs: 6.1 10*3/uL (ref 1.7–7.7)
Neutrophils Relative %: 78 %
Platelets: 293 10*3/uL (ref 150–400)
RBC: 4.7 MIL/uL (ref 3.87–5.11)
RDW: 12 % (ref 11.5–15.5)
WBC: 7.9 10*3/uL (ref 4.0–10.5)
nRBC: 0 % (ref 0.0–0.2)

## 2020-09-06 LAB — URINALYSIS, ROUTINE W REFLEX MICROSCOPIC
Bilirubin Urine: NEGATIVE
Glucose, UA: NEGATIVE mg/dL
Ketones, ur: 20 mg/dL — AB
Leukocytes,Ua: NEGATIVE
Nitrite: NEGATIVE
Protein, ur: 30 mg/dL — AB
Specific Gravity, Urine: 1.03 (ref 1.005–1.030)
pH: 5 (ref 5.0–8.0)

## 2020-09-06 MED ORDER — ONDANSETRON 4 MG PO TBDP
4.0000 mg | ORAL_TABLET | Freq: Once | ORAL | Status: AC
Start: 2020-09-06 — End: 2020-09-06
  Administered 2020-09-06: 4 mg via ORAL
  Filled 2020-09-06: qty 1

## 2020-09-06 MED ORDER — OXYCODONE-ACETAMINOPHEN 5-325 MG PO TABS
1.0000 | ORAL_TABLET | Freq: Once | ORAL | Status: AC
Start: 2020-09-06 — End: 2020-09-06
  Administered 2020-09-06: 1 via ORAL
  Filled 2020-09-06: qty 1

## 2020-09-06 NOTE — ED Provider Notes (Signed)
Emergency Medicine Provider Triage Evaluation Note  Jennifer Salazar , a 40 y.o. female  was evaluated in triage.  Pt complains of left lower quadrant abdominal pain, pelvic pain.  She is sexually active with her husband.  Has IUD.  Pain radiates into her lower back.  Cannot find a comfortable position.  Describes her pain as sharp and stabbing.  Remote history of kidney stones.  No urinary complaints.  No vaginal bleeding.  No diarrhea or constipation  Review of Systems  Positive: Left lower quadrant abdominal pain Negative: Vomiting, diarrhea  Physical Exam  BP (!) 162/112 (BP Location: Left Arm)   Pulse (!) 111   Temp 98.2 F (36.8 C) (Oral)   Resp 20   Ht 5\' 2"  (1.575 m)   Wt 82.2 kg   SpO2 95%   BMI 33.14 kg/m  Gen:   Awake, no distress   Resp:  Normal effort  MSK:   Moves extremities without difficulty  ABD:  Tenderness to left lower abdomen, left pelvic region Other:    Medical Decision Making  Medically screening exam initiated at 7:53 PM.  Appropriate orders placed.  Jennifer Salazar was informed that the remainder of the evaluation will be completed by another provider, this initial triage assessment does not replace that evaluation, and the importance of remaining in the ED until their evaluation is complete.  LLQ abd pain.  Labs and imaging ordered.   Debroah Loop, PA-C 09/06/20 1954    09/08/20, MD 09/07/20 [phone]

## 2020-09-06 NOTE — ED Notes (Signed)
US at bedside

## 2020-09-06 NOTE — ED Triage Notes (Addendum)
Pt reports severe lower abdominal pain that radiates down bilateral legs that began yesterday. Pt reports having an IUD. Pt denies abnormal vaginal bleeding or discharge. Pt denies urinary symptoms.

## 2020-09-07 ENCOUNTER — Emergency Department (HOSPITAL_COMMUNITY): Payer: BC Managed Care – PPO

## 2020-09-07 MED ORDER — OXYCODONE-ACETAMINOPHEN 5-325 MG PO TABS
1.0000 | ORAL_TABLET | Freq: Once | ORAL | Status: AC
  Administered 2020-09-07: 1 via ORAL
  Filled 2020-09-07: qty 1

## 2020-09-07 MED ORDER — KETOROLAC TROMETHAMINE 30 MG/ML IJ SOLN: 30.0000 mg | Freq: Once | INTRAMUSCULAR | Status: DC

## 2020-09-07 MED ORDER — KETOROLAC TROMETHAMINE 30 MG/ML IJ SOLN
30.0000 mg | Freq: Once | INTRAMUSCULAR | Status: AC
  Administered 2020-09-07: 30 mg via INTRAMUSCULAR
  Filled 2020-09-07: qty 1

## 2020-09-07 NOTE — ED Provider Notes (Signed)
Emergency Department Provider Note   I have reviewed the triage vital signs and the nursing notes.   HISTORY  Chief Complaint Abdominal Pain   HPI Jennifer Salazar is a 40 y.o. female with PMH reviewed below presents to the ED with lower abdominal/pelvic pain worsening in the last 48 hours.  Patient describes a burning pain in her lower abdomen and some shooting into the hips and thighs bilaterally.  She is not having any dysuria, hesitancy, urgency.  No fevers or chills.  No vaginal bleeding or discharge.  She notes a remote history of ovarian cyst as well as kidney stone but nothing recent.  No recent antibiotics. No CP or SOB.   Past Medical History:  Diagnosis Date   Anxiety    Hypertension    Ovarian cyst     Patient Active Problem List   Diagnosis Date Noted   Pelvic pain 04/10/2019   Hemorrhagic cyst of ovary 09/07/2018   Panic disorder 04/26/2018   Generalized anxiety disorder 03/26/2018   Bladder problem 11/06/2016   Hypertensive disorder 11/06/2016   Gastroesophageal reflux disease without esophagitis 11/06/2016    Past Surgical History:  Procedure Laterality Date   WISDOM TOOTH EXTRACTION      Allergies Promethazine, Benadryl [diphenhydramine], and Reglan [metoclopramide]  Family History  Problem Relation Age of Onset   Hypertension Mother    COPD Father    Crohn's disease Father    Anxiety disorder Father    Drug abuse Sister    Anxiety disorder Brother     Social History Social History   Tobacco Use   Smoking status: Former    Packs/day: 0.25    Years: 10.00    Pack years: 2.50    Types: Cigarettes    Quit date: 11/08/2017    Years since quitting: 2.8   Smokeless tobacco: Never  Vaping Use   Vaping Use: Never used  Substance Use Topics   Alcohol use: Not Currently   Drug use: Never    Review of Systems  Constitutional: No fever/chills Eyes: No visual changes. ENT: No sore throat. Cardiovascular: Denies chest  pain. Respiratory: Denies shortness of breath. Gastrointestinal: Positive abdominal pain. Mild nausea, no vomiting.  No diarrhea.  No constipation. Genitourinary: Negative for dysuria. Musculoskeletal: Negative for back pain. Skin: Negative for rash. Neurological: Negative for headaches, focal weakness or numbness.  10-point ROS otherwise negative.  ____________________________________________   PHYSICAL EXAM:  VITAL SIGNS: ED Triage Vitals  Enc Vitals Group     BP 09/06/20 1929 (!) 162/112     Pulse Rate 09/06/20 1929 (!) 111     Resp 09/06/20 1929 20     Temp 09/06/20 1929 98.2 F (36.8 C)     Temp Source 09/06/20 1929 Oral     SpO2 09/06/20 1929 95 %     Weight 09/06/20 1929 181 lb 3.2 oz (82.2 kg)     Height 09/06/20 1929 5\' 2"  (1.575 m)   Constitutional: Alert and oriented. Well appearing and in no acute distress. Eyes: Conjunctivae are normal.  Head: Atraumatic. Nose: No congestion/rhinnorhea. Mouth/Throat: Mucous membranes are moist.   Neck: No stridor.  Cardiovascular: Normal rate, regular rhythm. Good peripheral circulation. Grossly normal heart sounds.   Respiratory: Normal respiratory effort.  No retractions. Lungs CTAB. Gastrointestinal: Soft and nontender. No distention.  Musculoskeletal: No lower extremity tenderness nor edema. No gross deformities of extremities. Neurologic:  Normal speech and language. No gross focal neurologic deficits are appreciated.  Skin:  Skin is warm,  dry and intact. No rash noted.   ____________________________________________   LABS (all labs ordered are listed, but only abnormal results are displayed)  Labs Reviewed  CBC WITH DIFFERENTIAL/PLATELET - Abnormal; Notable for the following components:      Result Value   Hemoglobin 15.7 (*)    All other components within normal limits  COMPREHENSIVE METABOLIC PANEL - Abnormal; Notable for the following components:   Glucose, Bld 132 (*)    All other components within normal  limits  URINALYSIS, ROUTINE W REFLEX MICROSCOPIC - Abnormal; Notable for the following components:   APPearance TURBID (*)    Hgb urine dipstick MODERATE (*)    Ketones, ur 20 (*)    Protein, ur 30 (*)    Bacteria, UA MANY (*)    All other components within normal limits  I-STAT BETA HCG BLOOD, ED ([facility], [facility], [facility] ONLY)   ____________________________________________  RADIOLOGY  CT Renal Stone Study  Result Date: 09/07/2020 CLINICAL DATA:  Lower abdominal pain radiating down the bilateral legs. EXAM: CT ABDOMEN AND PELVIS WITHOUT CONTRAST TECHNIQUE: Multidetector CT imaging of the abdomen and pelvis was performed following the standard protocol without IV contrast. COMPARISON:  07/18/2020 FINDINGS: Lower chest:  No contributory findings. Hepatobiliary: No focal liver abnormality.No evidence of biliary obstruction or stone. Pancreas: Unremarkable. Spleen: Supra renal nodule which is isodense to spleen and remains consistent with splenule. Adrenals/Urinary Tract: Negative adrenals. No hydronephrosis or stone. Unremarkable bladder. Stomach/Bowel:  No obstruction. No appendicitis. Vascular/Lymphatic: No acute vascular abnormality. No mass or adenopathy. Reproductive:IUD in normal position. Other: No ascites or pneumoperitoneum. Musculoskeletal: No acute abnormalities. IMPRESSION: Stable exam.  No explanation for pain. Electronically Signed   By: Marnee Spring M.D.   On: 09/07/2020 05:29   US PELVIC COMPLETE W TRANSVAGINAL AND TORSION R/O  Result Date: 09/06/2020 CLINICAL DATA:  Pelvic pain, IUD placement 5 years prior EXAM: TRANSABDOMINAL AND TRANSVAGINAL ULTRASOUND OF PELVIS DOPPLER ULTRASOUND OF OVARIES TECHNIQUE: Both transabdominal and transvaginal ultrasound examinations of the pelvis were performed. Transabdominal technique was performed for global imaging of the pelvis including uterus, ovaries, adnexal regions, and pelvic cul-de-sac. It was necessary to proceed with endovaginal exam following the  transabdominal exam to visualize the uterus, endometrium, and ovaries. Color and duplex Doppler ultrasound was utilized to evaluate blood flow to the ovaries. COMPARISON:  CT 07/18/2020, ultrasound 07/17/2020 FINDINGS: Uterus Measurements: 6.8 x 3.1 x 4.8 cm = volume: 53.1 mL. Anteverted and slightly retroflexed uterus. No fibroids or concerning uterine mass. Endometrium Thickness: 4 mm, non thickened. Expected positioning of the echogenic IUD. No focal abnormality visualized. Right ovary Measurements: 3 x 1.7 x 1.7 cm = volume: 4.7 mL. Seen only on transvaginal imaging. Dominant cyst/follicle with small cumulus oophorus. Conglomerate size measuring 1.8 cm in maximal diameter. Grossly normal appearance. No worrisome discernible right adnexal mass or lesion. Left ovary Measurements: 2.2 x 1.6 x 1.7 cm = volume: 3 mL. Positioned quite high in the pelvis with challenging visualization seen only on transvaginal imaging. No concerning ovarian or adnexal mass is seen. Pulsed Doppler evaluation of both ovaries demonstrates normal low-resistance arterial and venous waveforms. Other findings No abnormal free fluid. IMPRESSION: No acute pelvic abnormality is identified. Specifically, no evidence of ovarian torsion. Grossly normal positioning of the IUD. Dominant follicle with cumulus oophorus seen in the right ovary measuring up to 1.8 cm in size. No routine followup imaging recommended. Note: This recommendation does not apply to patients with increased risk (genetic, family history, elevated tumor markers or other  high-risk factors) of ovarian cancer. Reference: Radiology 2019 Nov; 293(2):359-371. Electronically Signed   By: Kreg Shropshire M.D.   On: 09/06/2020 21:47    ____________________________________________   PROCEDURES  Procedure(s) performed:   Procedures  None  ____________________________________________   INITIAL IMPRESSION / ASSESSMENT AND PLAN / ED COURSE  Pertinent labs & imaging results that  were available during my care of the patient were reviewed by me and considered in my medical decision making (see chart for details).   Patient presents to the emergency department with lower abdominal and pelvic pain.  In review of the chart this appears to be an intermittent chronic issue for the patient.  She is on opiate medications at home.  Her pelvic ultrasound ordered from triage reviewed showing no torsion.  IUD in appropriate position. Some hemoglobin on UA.  No evidence of infection although many bacteria but no other findings to suspect UTI. No symptoms. Plan for CT renal and toradol here. Was given Percocet in triage.   CT renal without acute findings. Plan for close PCP follow up and continued pain mgmt at home with home meds. Discussed ED return precautions.  ____________________________________________  FINAL CLINICAL IMPRESSION(S) / ED DIAGNOSES  Final diagnoses:  Pelvic pain    MEDICATIONS GIVEN DURING THIS VISIT:  Medications  oxyCODONE-acetaminophen (PERCOCET/ROXICET) 5-325 MG per tablet 1 tablet (1 tablet Oral Given 09/06/20 1958)  ondansetron (ZOFRAN-ODT) disintegrating tablet 4 mg (4 mg Oral Given 09/06/20 1958)  ketorolac (TORADOL) 30 MG/ML injection 30 mg (30 mg Intramuscular Given 09/07/20 0517)  oxyCODONE-acetaminophen (PERCOCET/ROXICET) 5-325 MG per tablet 1 tablet (1 tablet Oral Given 09/07/20 3235)    Note:  This document was prepared using Dragon voice recognition software and may include unintentional dictation errors.  Alona Bene, MD, [facility] Emergency [facility]    Adreonna Yontz, Arlyss Repress, MD 09/07/20 [phone]

## 2020-09-07 NOTE — Discharge Instructions (Addendum)

## 2020-09-10 ENCOUNTER — Other Ambulatory Visit: Payer: Self-pay

## 2020-09-10 ENCOUNTER — Encounter (HOSPITAL_COMMUNITY): Payer: Self-pay

## 2020-09-10 ENCOUNTER — Emergency Department (HOSPITAL_COMMUNITY)
Admission: EM | Admit: 2020-09-10 | Discharge: 2020-09-10 | Disposition: A | Discharge status: Home / Self Care | Payer: BC Managed Care – PPO | Attending: Emergency Medicine | Admitting: Emergency Medicine

## 2020-09-10 DIAGNOSIS — R1032 Left lower quadrant pain: Secondary | ICD-10-CM | POA: Diagnosis not present

## 2020-09-10 DIAGNOSIS — I10 Essential (primary) hypertension: Secondary | ICD-10-CM | POA: Diagnosis not present

## 2020-09-10 DIAGNOSIS — R110 Nausea: Secondary | ICD-10-CM | POA: Diagnosis not present

## 2020-09-10 DIAGNOSIS — R1031 Right lower quadrant pain: Secondary | ICD-10-CM | POA: Insufficient documentation

## 2020-09-10 DIAGNOSIS — F419 Anxiety disorder, unspecified: Secondary | ICD-10-CM | POA: Diagnosis not present

## 2020-09-10 DIAGNOSIS — R1084 Generalized abdominal pain: Secondary | ICD-10-CM

## 2020-09-10 DIAGNOSIS — Z87891 Personal history of nicotine dependence: Secondary | ICD-10-CM | POA: Diagnosis not present

## 2020-09-10 DIAGNOSIS — R102 Pelvic and perineal pain: Secondary | ICD-10-CM | POA: Diagnosis not present

## 2020-09-10 DIAGNOSIS — Z79899 Other long term (current) drug therapy: Secondary | ICD-10-CM | POA: Insufficient documentation

## 2020-09-10 LAB — URINALYSIS, ROUTINE W REFLEX MICROSCOPIC
Bacteria, UA: NONE SEEN
Bilirubin Urine: NEGATIVE
Glucose, UA: NEGATIVE mg/dL
Ketones, ur: NEGATIVE mg/dL
Leukocytes,Ua: NEGATIVE
Nitrite: NEGATIVE
Protein, ur: NEGATIVE mg/dL
Specific Gravity, Urine: 1.028 (ref 1.005–1.030)
pH: 6 (ref 5.0–8.0)

## 2020-09-10 LAB — COMPREHENSIVE METABOLIC PANEL
ALT: 9 U/L (ref 0–44)
AST: 15 U/L (ref 15–41)
Albumin: 4.1 g/dL (ref 3.5–5.0)
Alkaline Phosphatase: 68 U/L (ref 38–126)
Anion gap: 8 (ref 5–15)
BUN: 8 mg/dL (ref 6–20)
CO2: 24 mmol/L (ref 22–32)
Calcium: 8.9 mg/dL (ref 8.9–10.3)
Chloride: 106 mmol/L (ref 98–111)
Creatinine, Ser: 0.63 mg/dL (ref 0.44–1.00)
GFR, Estimated: >60 mL/min (ref >60)
Glucose, Bld: 111 mg/dL — ABNORMAL HIGH (ref 70–99)
Potassium: 3.5 mmol/L (ref 3.5–5.1)
Sodium: 138 mmol/L (ref 135–145)
Total Bilirubin: 0.7 mg/dL (ref 0.3–1.2)
Total Protein: 7 g/dL (ref 6.5–8.1)

## 2020-09-10 LAB — CBC
HCT: 43.5 % (ref 36.0–46.0)
Hemoglobin: 14.9 g/dL (ref 12.0–15.0)
MCH: 33.1 pg (ref 26.0–34.0)
MCHC: 34.3 g/dL (ref 30.0–36.0)
MCV: 96.7 fL (ref 80.0–100.0)
Platelets: 304 10*3/uL (ref 150–400)
RBC: 4.5 MIL/uL (ref 3.87–5.11)
RDW: 12.1 % (ref 11.5–15.5)
WBC: 7.8 10*3/uL (ref 4.0–10.5)
nRBC: 0 % (ref 0.0–0.2)

## 2020-09-10 LAB — I-STAT BETA HCG BLOOD, ED (MC, WL, AP ONLY): I-stat hCG, quantitative: <5 m[IU]/mL (ref <5)

## 2020-09-10 LAB — LIPASE, BLOOD: Lipase: 26 U/L (ref 11–51)

## 2020-09-10 MED ORDER — KETOROLAC TROMETHAMINE 30 MG/ML IJ SOLN: 30.0000 mg | Freq: Once | INTRAMUSCULAR | Status: DC

## 2020-09-10 MED ORDER — DICYCLOMINE HCL 10 MG PO CAPS
20.0000 mg | ORAL_CAPSULE | Freq: Once | ORAL | Status: AC
  Administered 2020-09-10: 20 mg via ORAL
  Filled 2020-09-10: qty 2

## 2020-09-10 MED ORDER — OXYCODONE-ACETAMINOPHEN 5-325 MG PO TABS
1.0000 | ORAL_TABLET | Freq: Once | ORAL | Status: AC
Start: 2020-09-10 — End: 2020-09-10
  Administered 2020-09-10: 1 via ORAL
  Filled 2020-09-10: qty 1

## 2020-09-10 MED ORDER — ONDANSETRON HCL 4 MG PO TABS: 4.0000 mg | ORAL_TABLET | Freq: Four times a day (QID) | ORAL | 0 refills | Status: AC

## 2020-09-10 MED ORDER — ONDANSETRON 8 MG PO TBDP
8.0000 mg | ORAL_TABLET | Freq: Once | ORAL | Status: AC
  Administered 2020-09-10: 8 mg via ORAL
  Filled 2020-09-10: qty 1

## 2020-09-10 MED ORDER — KETOROLAC TROMETHAMINE 30 MG/ML IJ SOLN
30.0000 mg | Freq: Once | INTRAMUSCULAR | Status: AC
  Administered 2020-09-10: 30 mg via INTRAVENOUS
  Filled 2020-09-10: qty 1

## 2020-09-10 NOTE — ED Provider Notes (Signed)
[facility]-EMERGENCY DEPT Provider Note   CSN: [id number] Arrival date & time: 09/10/20  5027     History Chief Complaint  Patient presents with  . Abdominal Pain  . Nausea  . light headedness  . Anxiety    Jennifer Salazar is a 40 y.o. female.   Abdominal Pain Associated symptoms: nausea   Associated symptoms: no dysuria, no hematuria and no vomiting   Anxiety Associated symptoms include abdominal pain.   Patient presents with right lower and left lower abdominal pain.  The pain is a pulsating pain that is constant, she is unable to get comfortable.  Nothing makes it better, it is constantly bad but nothing seems to make it worse.  There is nausea, but no vomiting.  She has tried hydrocodone without relief.  She was seen on Friday where she had a CAT scan and ultrasound done that were unrevealing.  Past Medical History:  Diagnosis Date  . Anxiety   . Hypertension   . Ovarian cyst     Patient Active Problem List   Diagnosis Date Noted  . Pelvic pain 04/10/2019  . Hemorrhagic cyst of ovary 09/07/2018  . Panic disorder 04/26/2018  . Generalized anxiety disorder 03/26/2018  . Bladder problem 11/06/2016  . Hypertensive disorder 11/06/2016  . Gastroesophageal reflux disease without esophagitis 11/06/2016    Past Surgical History:  Procedure Laterality Date  . CARPAL TUNNEL RELEASE Right   . WISDOM TOOTH EXTRACTION       OB History     Gravida  0   Para  0   Term  0   Preterm  0   AB  0   Living  0      SAB  0   IAB  0   Ectopic  0   Multiple  0   Live Births  0           Family History  Problem Relation Age of Onset  . Hypertension Mother   . COPD Father   . Crohn's disease Father   . Anxiety disorder Father   . Drug abuse Sister   . Anxiety disorder Brother     Social History   Tobacco Use  . Smoking status: Former    Packs/day: 0.25    Years: 10.00    Pack years: 2.50    Types: Cigarettes    Quit  date: 11/08/2017    Years since quitting: 2.8  . Smokeless tobacco: Never  Vaping Use  . Vaping Use: Never used  Substance Use Topics  . Alcohol use: Not Currently  . Drug use: Never    Home Medications Prior to Admission medications   Medication Sig Start Date End Date Taking? Authorizing Provider  albuterol (VENTOLIN HFA) 108 (90 Base) MCG/ACT inhaler Inhale 1 puff into the lungs every 6 (six) hours as needed for wheezing or shortness of breath. Patient not taking: Reported on 04/10/2019 01/07/19   Dietrich Pates, PA-C  ALPRAZolam Prudy Feeler) 1 MG tablet Take 1 tablet (1 mg total) by mouth 2 (two) times daily as needed for anxiety. 07/16/20 09/14/20  Arfeen, Phillips Grout, MD  calcium carbonate (TUMS - DOSED IN MG ELEMENTAL CALCIUM) 500 MG chewable tablet Chew 1 tablet by mouth 2 (two) times daily as needed for indigestion or heartburn.    [provider]  fluconazole (DIFLUCAN) 150 MG tablet Take 1 tablet (150 mg total) by mouth daily. Repeat in 24 hours if needed 04/12/19   Reva Bores, MD  gabapentin (NEURONTIN) 300 MG capsule TAKE 1 CAPSULE (300 MG TOTAL) BY MOUTH 3 (THREE) TIMES DAILY. MAY USE 2 AT BEDTIME 09/04/19   Anyanwu, Jethro Bastos, MD  HYDROcodone-acetaminophen (NORCO/VICODIN) 5-325 MG tablet Take 1-2 tablets by mouth every 6 (six) hours as needed. 08/08/19   Roxy Horseman, PA-C  levonorgestrel (MIRENA) 20 MCG/24HR IUD 1 each by Intrauterine route once. Implanted winter 2017    [provider]  lisinopril (ZESTRIL) 20 MG tablet TAKE 1 TABLET BY MOUTH TWICE A DAY 03/06/19   Lezlie Lye, Meda Coffee, MD  meclizine (ANTIVERT) 25 MG tablet Take 1 tablet (25 mg total) by mouth 3 (three) times daily as needed for dizziness. Patient not taking: Reported on 04/10/2019 01/07/19   Dietrich Pates, PA-C  ondansetron (ZOFRAN-ODT) 4 MG disintegrating tablet Take 1 tablet (4 mg total) by mouth every 8 (eight) hours as needed for nausea or vomiting. 11/17/19   Pucilowski, Roosvelt Maser, MD   phenazopyridine (PYRIDIUM) 200 MG tablet Take 1 tablet (200 mg total) by mouth 3 (three) times daily as needed for pain. 02/01/19   Little, Ambrose Finland, MD  predniSONE (DELTASONE) 10 MG tablet Take 2 tablets daily for the first 7 days, then take 1 tablet daily for the next 7 days. 08/08/19   Roxy Horseman, PA-C  sertraline (ZOLOFT) 100 MG tablet Take 2 tablets (200 mg total) by mouth daily. 06/19/20   Patrick North, MD  tamsulosin (FLOMAX) 0.4 MG CAPS capsule Take 1 capsule (0.4 mg total) by mouth daily. 02/01/19   Little, Ambrose Finland, MD  pantoprazole (PROTONIX) 40 MG tablet Take 1 tablet (40 mg total) by mouth daily. Patient not taking: Reported on 09/05/2018 06/16/18 09/05/18  Tressia Danas, MD  potassium chloride SA (K-DUR) 20 MEQ tablet Take 1 tablet (20 mEq total) by mouth daily. Patient not taking: Reported on 09/05/2018 07/03/18 09/05/18  Loren Racer, MD    Allergies    Promethazine, Benadryl [diphenhydramine], and Reglan [metoclopramide]  Review of Systems   Review of Systems  Gastrointestinal:  Positive for abdominal pain and nausea. Negative for vomiting.  Genitourinary:  Negative for dysuria and hematuria.   Physical Exam Updated Vital Signs BP (!) 136/101   Pulse (!) 104   Temp 97.8 F (36.6 C) (Oral)   Resp 16   Ht 5\' 2"  (1.575 m)   Wt 83.9 kg   SpO2 100%   BMI 33.84 kg/m   Physical Exam Vitals and nursing note reviewed.  Constitutional:      General: She is not in acute distress.    Appearance: She is well-developed.     Comments: Patient is anxious appearing  HENT:     Head: Normocephalic and atraumatic.  Eyes:     Conjunctiva/sclera: Conjunctivae normal.  Cardiovascular:     Rate and Rhythm: Normal rate and regular rhythm.     Heart sounds: No murmur heard. Pulmonary:     Effort: Pulmonary effort is normal. No respiratory distress.     Breath sounds: Normal breath sounds.  Abdominal:     Palpations: Abdomen is soft.     Tenderness: There is  abdominal tenderness in the suprapubic area.     Comments: Pain is suprapubic without guarding.  There is no rigidity of the abdomen, no CVA tenderness.  Musculoskeletal:     Cervical back: Neck supple.  Skin:    General: Skin is warm and dry.  Neurological:     Mental Status: She is alert.    ED Results / Procedures /  Treatments   Labs (all labs ordered are listed, but only abnormal results are displayed) Labs Reviewed  LIPASE, BLOOD  COMPREHENSIVE METABOLIC PANEL  CBC  URINALYSIS, ROUTINE W REFLEX MICROSCOPIC  I-STAT BETA HCG BLOOD, ED ([facility], [facility], [facility] ONLY)    EKG None  Radiology No results found.  Procedures Procedures   Medications Ordered in ED Medications  dicyclomine (BENTYL) capsule 20 mg (has no administration in time range)  ondansetron (ZOFRAN-ODT) disintegrating tablet 8 mg (has no administration in time range)    ED Course  I have reviewed the triage vital signs and the nursing notes.  Pertinent labs & imaging results that were available during my care of the patient were reviewed by me and considered in my medical decision making (see chart for details).  Clinical Course as of 09/10/20 0944  Tue Sep 10, 2020  0944 Heart rate 78 at time of discharge.  [HS]    Clinical Course User Index [HS] Theron Arista, PA-C   MDM Rules/Calculators/A&P                           There is some mild suprapubic tenderness to exam, but otherwise not remarkable.  No rigidity or guarding, negative peritoneal signs.  We will try symptomatic management with Bentyl and Zofran.  Lab work ordered.  As she had CT scan and pelvic ultrasound done 2 days ago I am not going to repeat that at this time.  Nausea has improved, but pain is still present.  Patient requests a shot of medicine, will give her Toradol.  Lab work-up is reassuring.  No leukocytosis concerning for abdominal infectious or inflammatory etiology.  UA does not show signs of urinary tract infection.  Lipase is not  concerning for pancreatitis.  Patient is not anemic, no electrolyte derangement or AKI.  Pregnancy is negative, doubt ectopic pregnancy.  I reviewed the CT scan and ultrasound done 2 days ago.  I do not think further radiographic imaging would be helpful at this time.  I suspect this abdominal pain could be slightly due to anxiety, could also be a gastritis.  I doubt any emergent disease.  I discussed her work-up with her as well as follow-up instructions.  Patient is agreeable to following up with a GI specialist as well as following up with her psychiatrist to continue her anxiety medication.  Return precautions were discussed and patient is agreeable to the plan.  Final Clinical Impression(s) / ED Diagnoses Final diagnoses:  None    Rx / DC Orders ED Discharge Orders     None        Theron Arista, PA-C 09/10/20 [id number]    Benjiman Core, MD 09/10/20 [phone]

## 2020-09-10 NOTE — ED Triage Notes (Addendum)
Patient c/o abdominal pain, leg pain, nausea, and lightheadedness x 5 days. Patient reports that she has been having "little anxiety attacks because she does not know what is going on and is scared."

## 2020-09-10 NOTE — Discharge Instructions (Addendum)
Please call one of the GI specialist listed above to set up an appointment as soon as possible.  You will likely need to have an endoscopy or colonoscopy done to rule out other underlying pathology. Please get a resource guide from your insurance company regarding with psychiatry that is covered in your plan.  Call to set up a follow-up appointment as soon as possible given that he only have 1 month left of your Zoloft. Continue taking medicine as prescribed. Can take Zofran every 6-8 hours as needed for nausea and vomiting.  Continue take your pain medicine such as Tylenol ibuprofen as needed. If things change or worsen please return back to the ED for further evaluation.

## 2020-09-29 ENCOUNTER — Other Ambulatory Visit (HOSPITAL_COMMUNITY): Payer: Self-pay | Admitting: Psychiatry

## 2021-05-20 IMAGING — US TRANSVAGINAL ULTRASOUND OF PELVIS
1 series · 13 of 25 positions shown · non-contrast
Comparison: None.

CLINICAL DATA: Right lower quadrant pain

EXAM:
TRANSABDOMINAL AND TRANSVAGINAL ULTRASOUND OF PELVIS
DOPPLER ULTRASOUND OF OVARIES
TECHNIQUE: Both transabdominal and transvaginal ultrasound examinations of the
pelvis were performed. Transabdominal technique was performed for
global imaging of the pelvis including uterus, ovaries, adnexal
regions, and pelvic cul-de-sac.
It was necessary to proceed with endovaginal exam following the
transabdominal exam to visualize the ovaries. Color and duplex
Doppler ultrasound was utilized to evaluate blood flow to the
ovaries.

[Series 1: transvaginal ultrasound of pelvis · 13 of 104 slices shown]
[im 1/104]
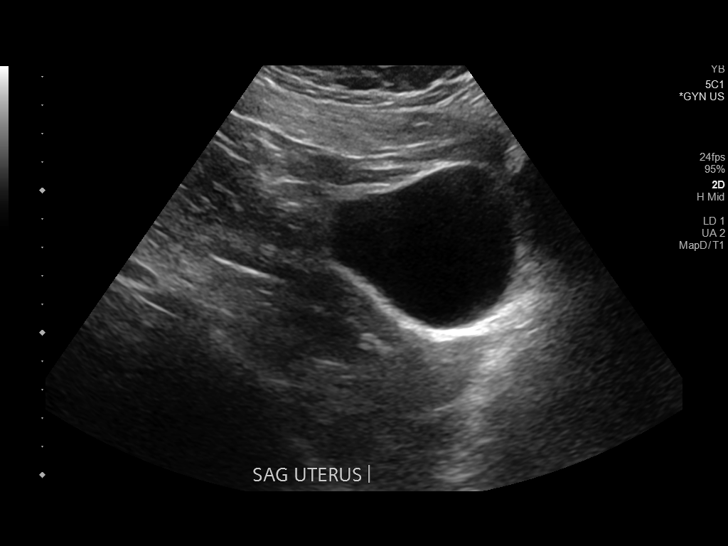
[im 9/104]
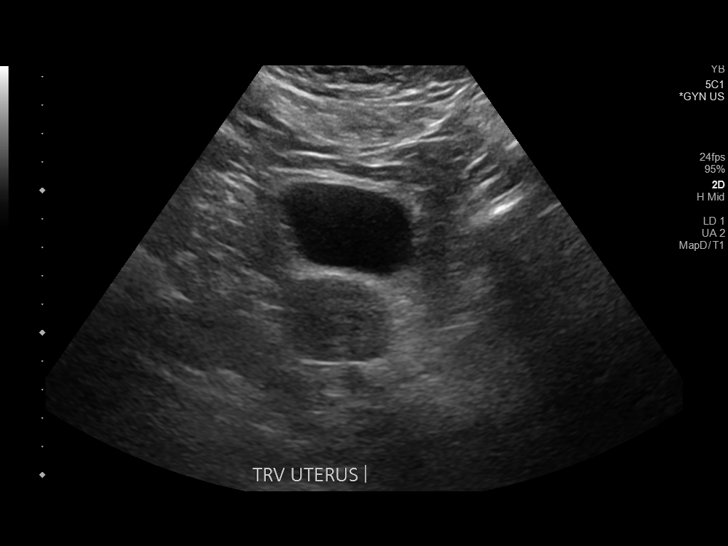
[im 18/104]
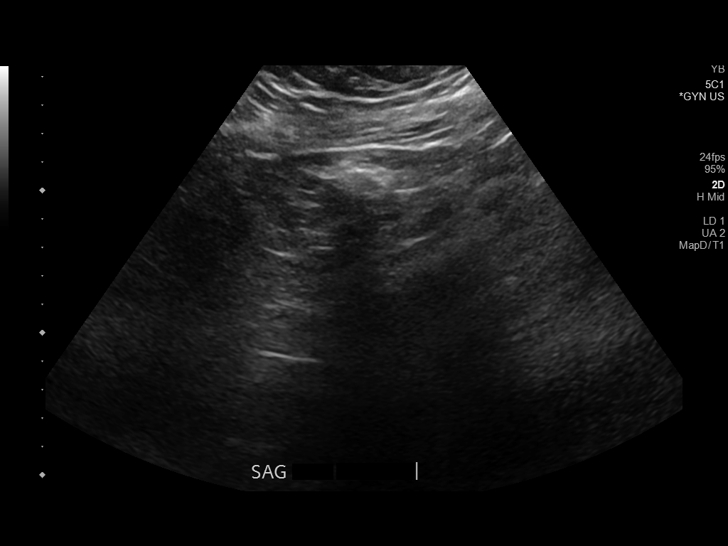
[im 26/104]
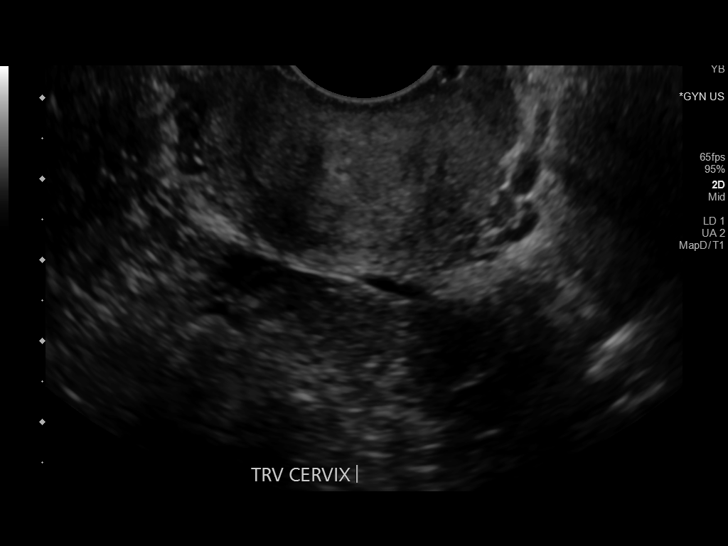
[im 35/104]
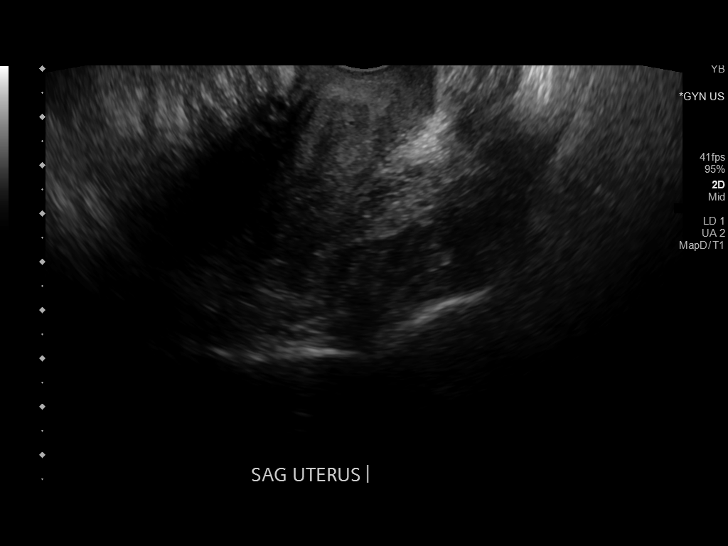
[im 43/104]
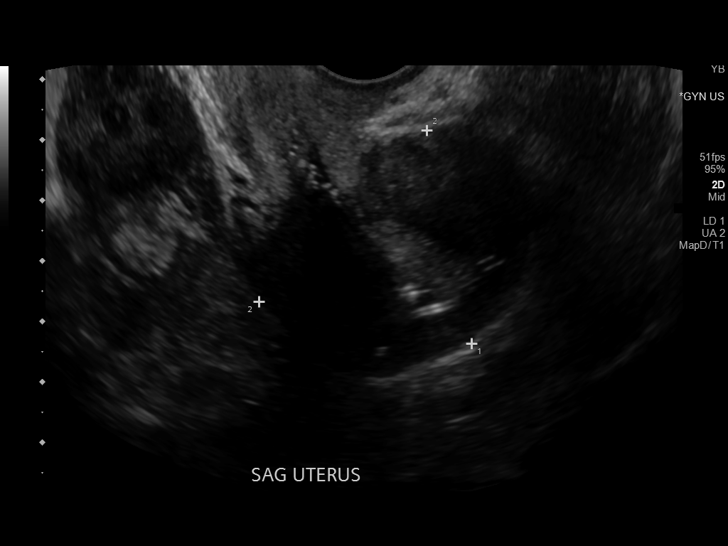
[im 52/104]
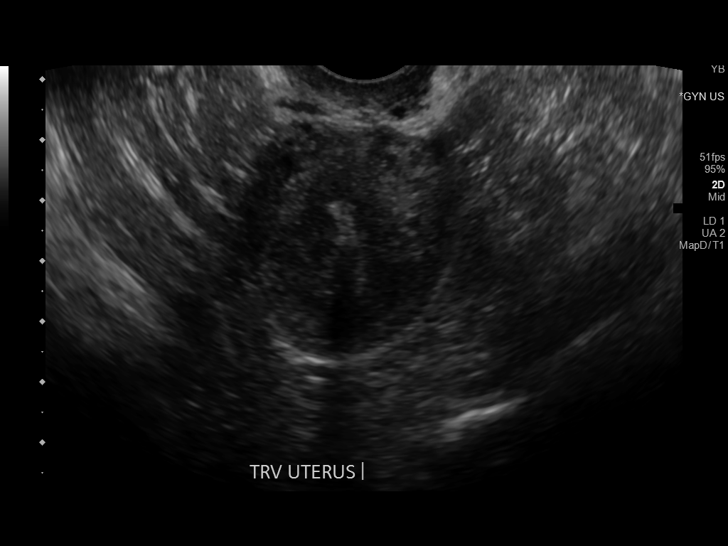
[im 61/104]
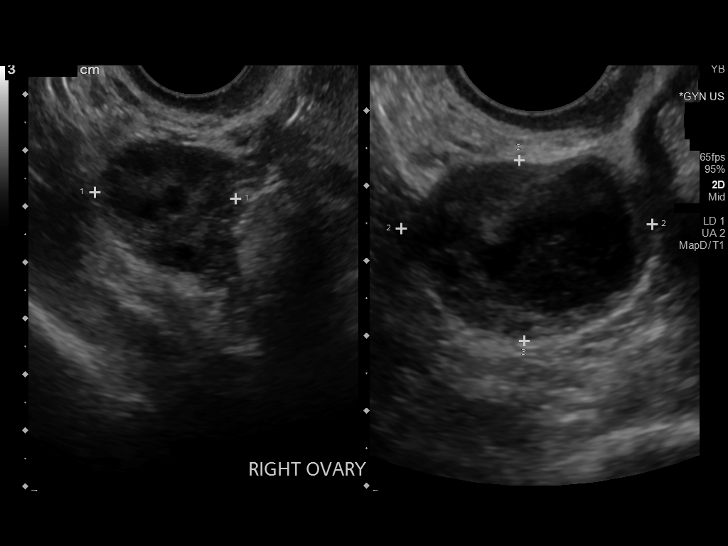
[im 69/104]
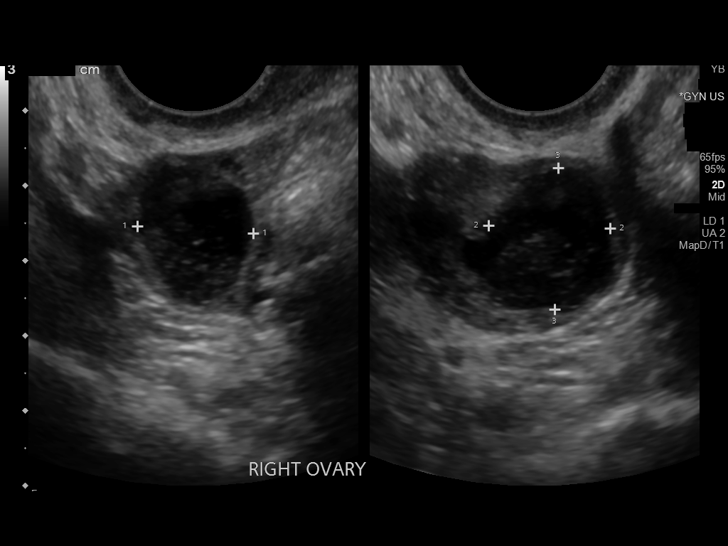
[im 78/104]
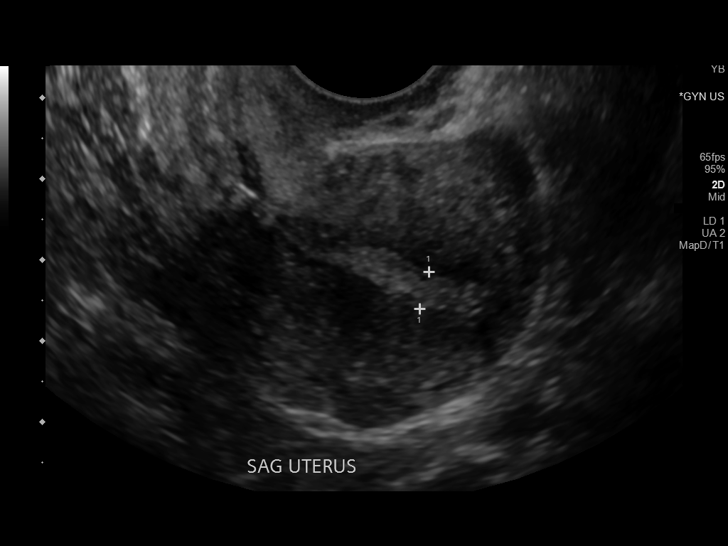
[im 86/104]
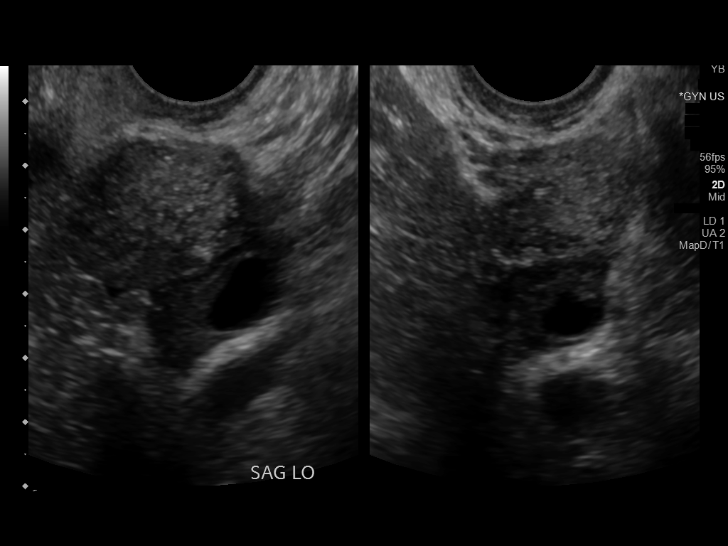
[im 95/104]
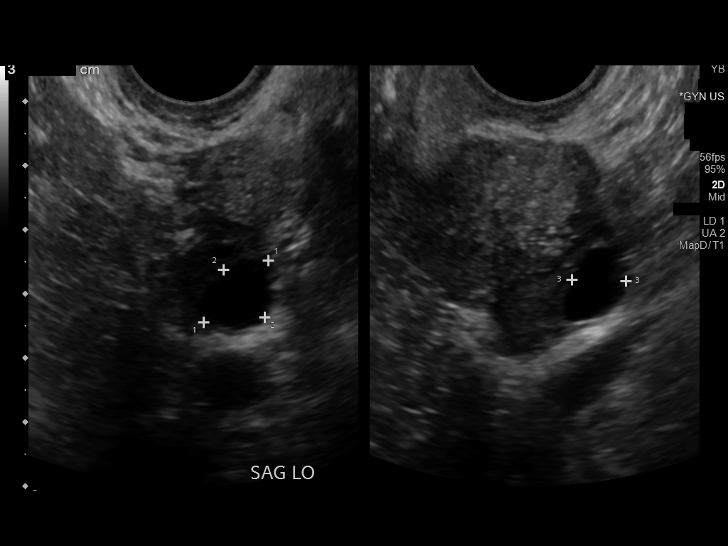
[im 104/104]
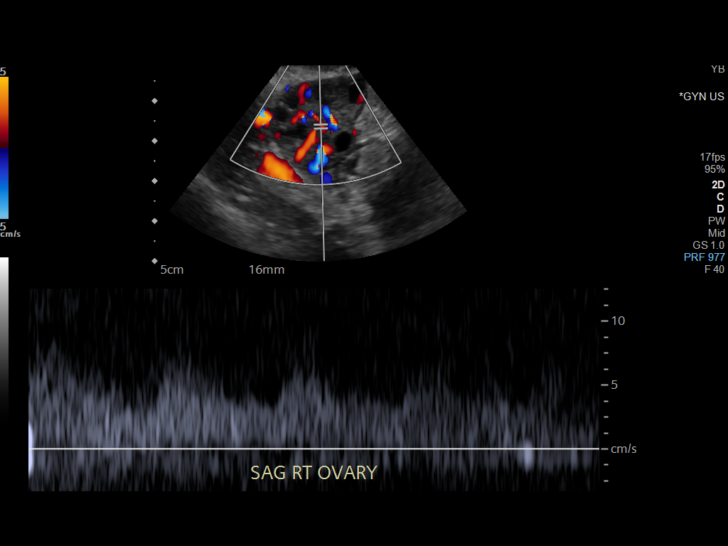

[13 of 25 positions shown; findings below may reference images not displayed]

FINDINGS: Uterus

Measurements: 6.5 x 4.0 x 3.6 cm. = volume: 49 mL. No fibroids or
other mass visualized.

Endometrium

Thickness: 5 mm.  An IUD is noted in satisfactory position.

Right ovary

Measurements: 3.4 x 2.5 x 2.4 cm. = volume: 10.7 mL. 1.9 cm complex
cystic lesion is noted within the right ovary. This is similar to
that seen on prior CT. Small central echogenicity is noted and this
likely represents a hemorrhagic cyst.

Left ovary

Measurements: 2.4 x 1.2 x 1.5 cm. = volume: 2.3 mL. Follicular
changes on the left are noted.

Pulsed Doppler evaluation of both ovaries demonstrates normal
low-resistance arterial and venous waveforms.

Other findings

Trace likely physiologic fluid is noted.
IMPRESSION: Complex cystic lesion in the right ovary which likely represents a
hemorrhagic cyst.

IUD in place.

No other focal abnormality is noted.

## 2021-05-20 IMAGING — CT CT ABDOMEN AND PELVIS WITH CONTRAST
2 of 4 series · 16 of 46 positions shown, 18 images · IV contrast (OMNIPAQUE)
Comparison: None

CLINICAL DATA: Lower abdominopelvic pain question appendicitis, has
IUD

EXAM:
CT ABDOMEN AND PELVIS WITH CONTRAST
TECHNIQUE: Multidetector CT imaging of the abdomen and pelvis was performed
using the standard protocol following bolus administration of
intravenous contrast. Sagittal and coronal MPR images reconstructed
from axial data set.
CONTRAST:  75mL OMNIPAQUE IOHEXOL 300 MG/ML SOLN IV. No oral
contrast.

[Series 2: axial st · axial · 0.75mm/px · z∈[-544,-100]mm · 13 of 101 slices shown, 15 images]
[im 6/101  soft-tissue]
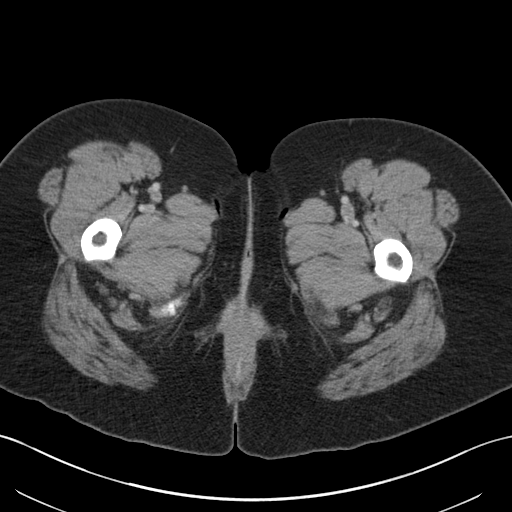
[im 6/101  bone]
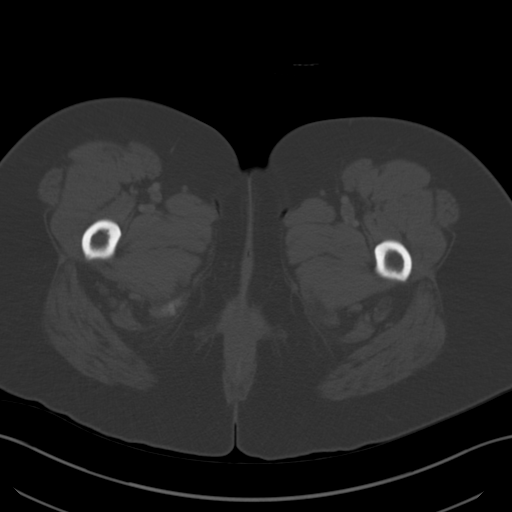
[im 12/101  soft-tissue]
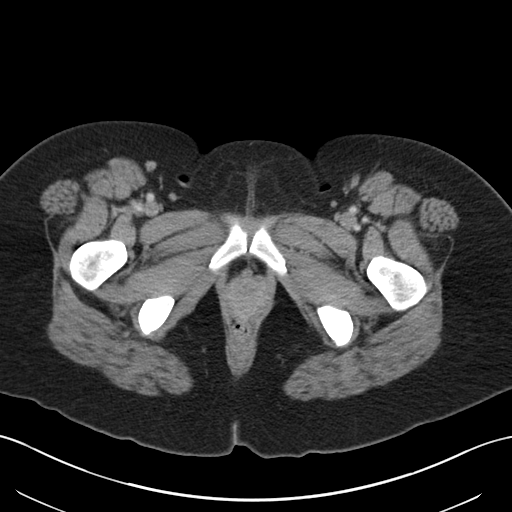
[im 23/101  soft-tissue]
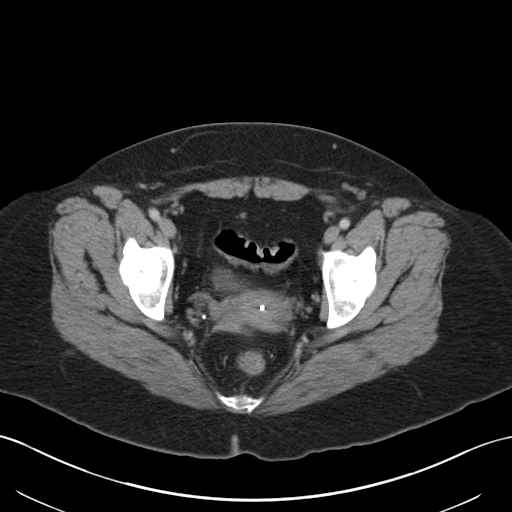
[im 28/101  soft-tissue]
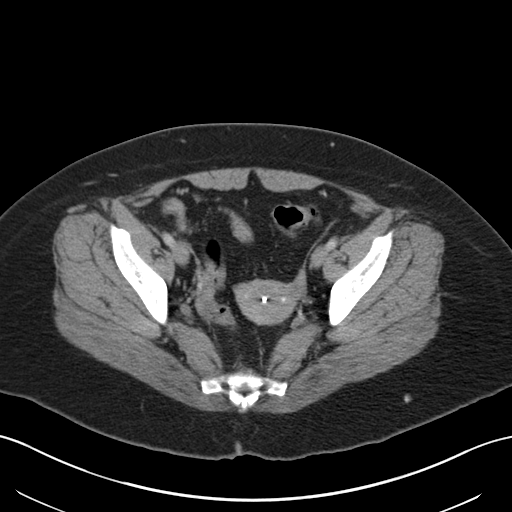
[im 34/101  soft-tissue]
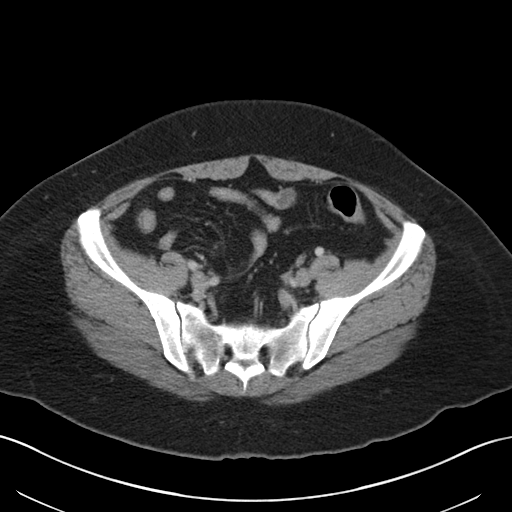
[im 45/101  soft-tissue]
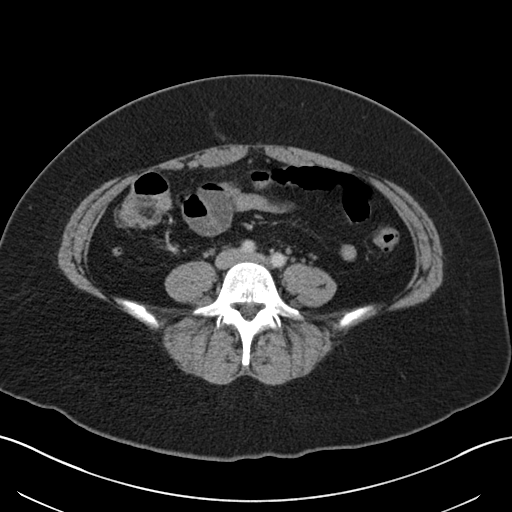
[im 51/101  soft-tissue]
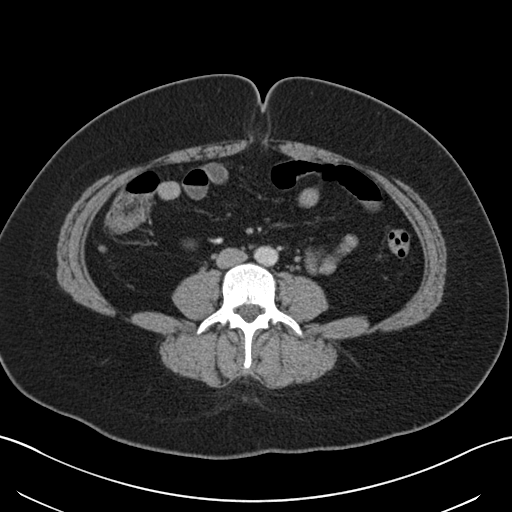
[im 56/101  soft-tissue]
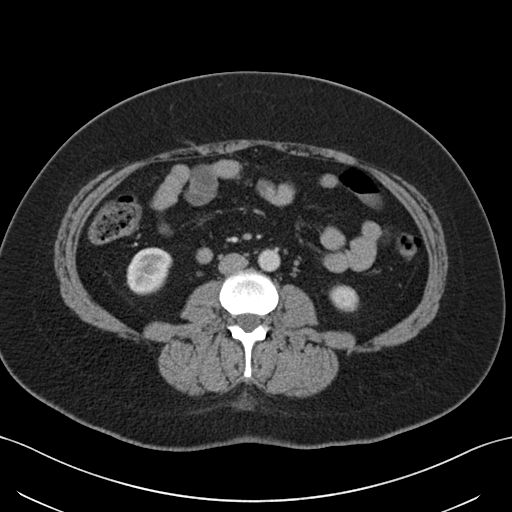
[im 67/101  soft-tissue]
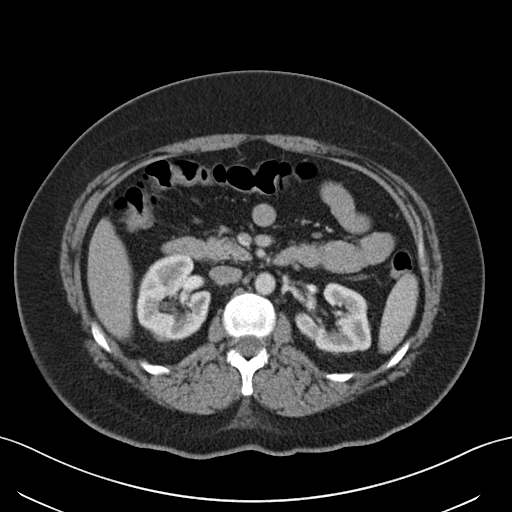
[im 67/101  bone]
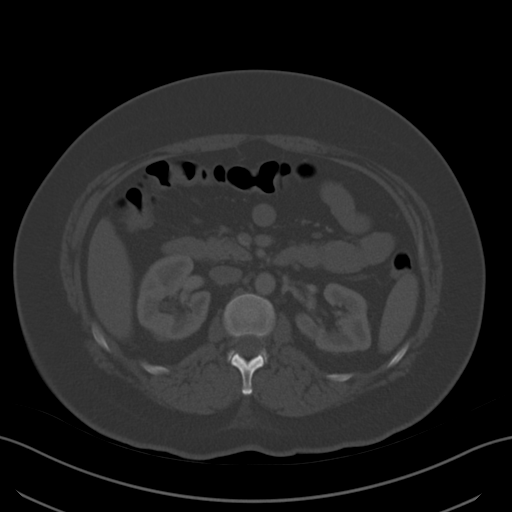
[im 73/101  soft-tissue]
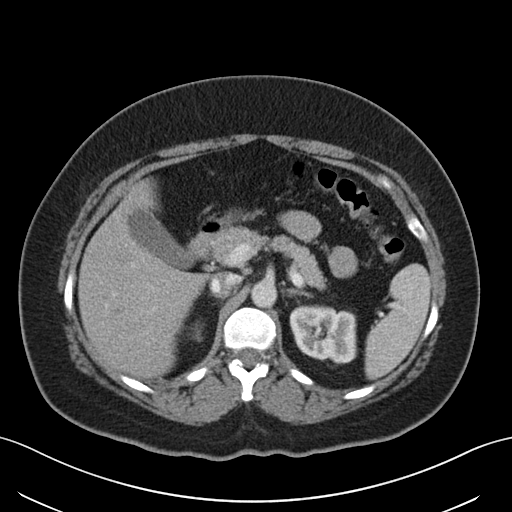
[im 78/101  soft-tissue]
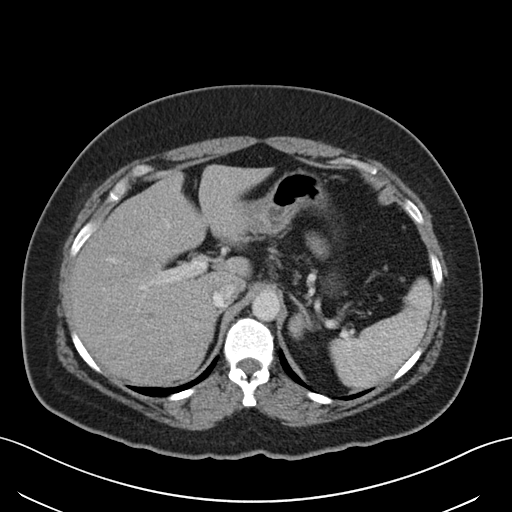
[im 89/101  soft-tissue]
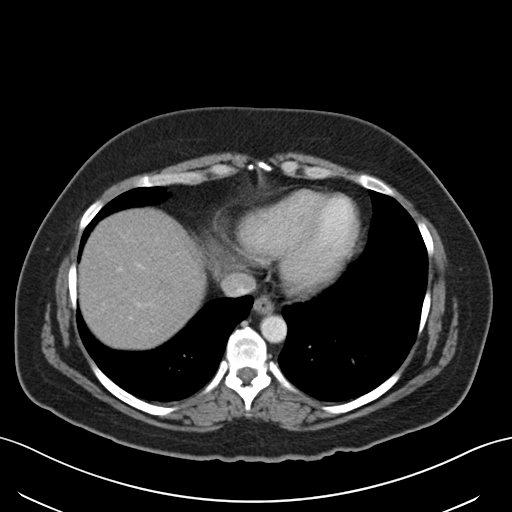
[im 95/101  soft-tissue]
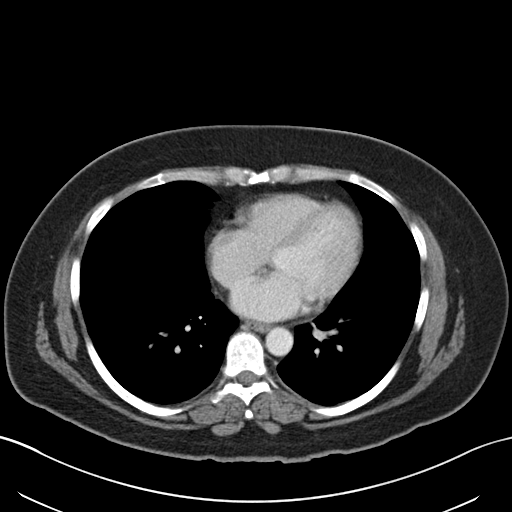

[Series 4: coronal st · coronal · 0.78mm/px · 3 of 146 slices shown]
[im 49/146  soft-tissue]
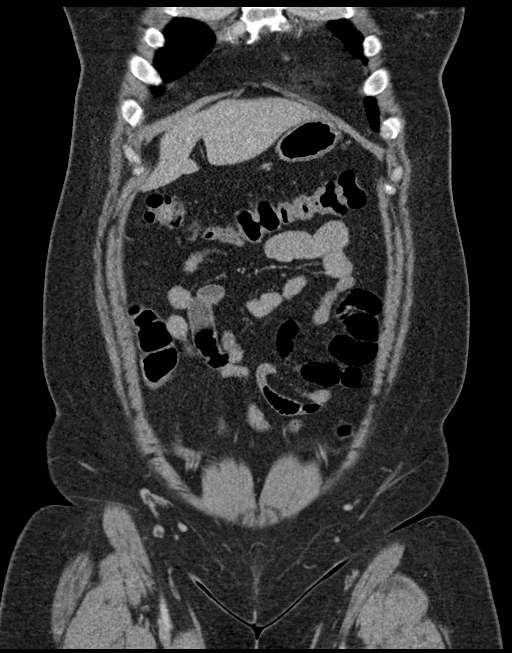
[im 65/146  soft-tissue]
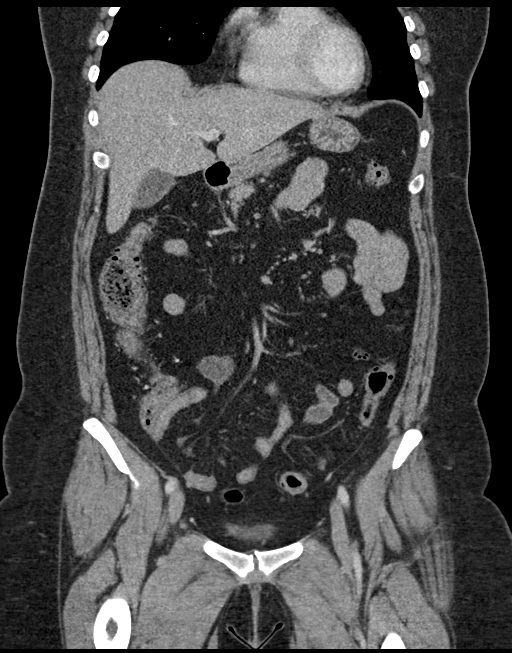
[im 81/146  soft-tissue]
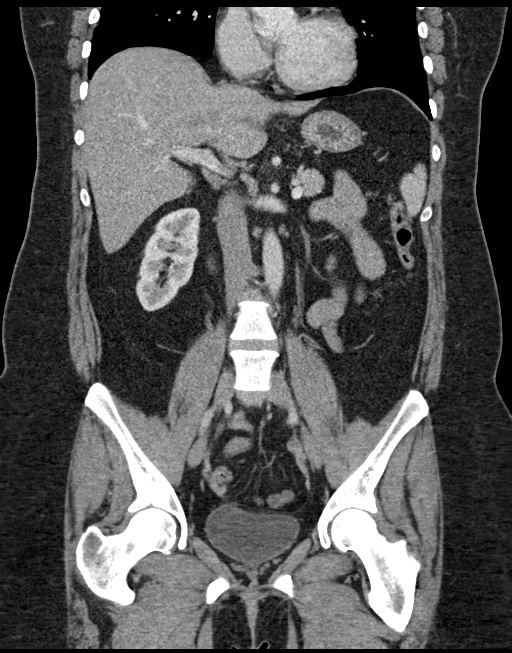

[16 of 46 positions shown; findings below may reference images not displayed]

FINDINGS: Lower chest: Lung bases clear

Hepatobiliary: Gallbladder and liver normal appearance

Pancreas: Normal appearance

Spleen: Normal appearance. 3.0 x 2.0 x 2.7 cm diameter mass adjacent
to the LEFT adrenal gland, medial to the LEFT spleen, with
attenuation measurements similar to splenic tissue, favor splenule.

Adrenals/Urinary Tract: Adrenal glands, kidneys, ureters, and
bladder normal appearance

Stomach/Bowel: Normal appendix, retrocecal. Stomach and bowel loops
normal appearance for technique

Vascular/Lymphatic: Vascular structures patent and unremarkable. No
adenopathy.

Reproductive: IUD within expected position within uterus.
Unremarkable LEFT ovary. Dominant follicle RIGHT ovary 2.3 x 1.6 cm.

Other: No free air or free fluid. Small umbilical hernia containing
fat. No inflammatory process.

Musculoskeletal: Unremarkable
IMPRESSION: Small umbilical hernia containing fat.

IUD within expected position within the uterus.

Probable splenule medial to spleen adjacent to but separate from the
LEFT adrenal gland.

No acute intra-abdominal or intrapelvic abnormalities.
